# Patient Record
Sex: Female | Born: 1955 | Race: White | Hispanic: No | Marital: Married | Smoking: Never smoker
Health system: Southern US, Community
[De-identification: ages and names within clinical notes are randomized; demographics above are authoritative.]

## PROBLEM LIST (undated history)

## (undated) DIAGNOSIS — K589 Irritable bowel syndrome without diarrhea: Secondary | ICD-10-CM

## (undated) DIAGNOSIS — F952 Tourette's disorder: Secondary | ICD-10-CM

## (undated) DIAGNOSIS — T4145XA Adverse effect of unspecified anesthetic, initial encounter: Secondary | ICD-10-CM

## (undated) DIAGNOSIS — T8859XA Other complications of anesthesia, initial encounter: Secondary | ICD-10-CM

## (undated) DIAGNOSIS — Z9889 Other specified postprocedural states: Secondary | ICD-10-CM

## (undated) DIAGNOSIS — M1990 Unspecified osteoarthritis, unspecified site: Secondary | ICD-10-CM

## (undated) DIAGNOSIS — R112 Nausea with vomiting, unspecified: Secondary | ICD-10-CM

## (undated) DIAGNOSIS — N814 Uterovaginal prolapse, unspecified: Secondary | ICD-10-CM

## (undated) DIAGNOSIS — J3089 Other allergic rhinitis: Secondary | ICD-10-CM

## (undated) DIAGNOSIS — R519 Headache, unspecified: Secondary | ICD-10-CM

## (undated) DIAGNOSIS — R51 Headache: Secondary | ICD-10-CM

## (undated) DIAGNOSIS — N816 Rectocele: Secondary | ICD-10-CM

## (undated) DIAGNOSIS — Z923 Personal history of irradiation: Secondary | ICD-10-CM

## (undated) DIAGNOSIS — M25551 Pain in right hip: Secondary | ICD-10-CM

## (undated) DIAGNOSIS — R87619 Unspecified abnormal cytological findings in specimens from cervix uteri: Secondary | ICD-10-CM

## (undated) HISTORY — DX: Headache, unspecified: R51.9

## (undated) HISTORY — DX: Unspecified abnormal cytological findings in specimens from cervix uteri: R87.619

## (undated) HISTORY — DX: Headache: R51

## (undated) HISTORY — DX: Rectocele: N81.6

## (undated) HISTORY — PX: SINOSCOPY: SHX187

## (undated) HISTORY — PX: EYE SURGERY: SHX253

## (undated) HISTORY — PX: BUNIONECTOMY: SHX129

## (undated) HISTORY — DX: Uterovaginal prolapse, unspecified: N81.4

## (undated) HISTORY — DX: Irritable bowel syndrome, unspecified: K58.9

## (undated) HISTORY — PX: TONSILLECTOMY: SUR1361

## (undated) HISTORY — PX: FOOT SURGERY: SHX648

## (undated) HISTORY — DX: Tourette's disorder: F95.2

## (undated) HISTORY — DX: Pain in right hip: M25.551

## (undated) HISTORY — DX: Unspecified osteoarthritis, unspecified site: M19.90

---
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
## 1999-05-14 HISTORY — PX: JOINT REPLACEMENT: SHX530

## 1999-06-21 ENCOUNTER — Encounter: Payer: Self-pay | Admitting: Rheumatology

## 1999-06-21 ENCOUNTER — Encounter: Admission: RE | Admit: 1999-06-21 | Discharge: 1999-06-21 | Payer: Self-pay | Admitting: Family Medicine

## 1999-07-18 ENCOUNTER — Other Ambulatory Visit: Admission: RE | Admit: 1999-07-18 | Discharge: 1999-07-18 | Payer: Self-pay | Admitting: Obstetrics and Gynecology

## 2000-04-11 ENCOUNTER — Encounter: Admission: RE | Admit: 2000-04-11 | Discharge: 2000-04-11 | Payer: Self-pay | Admitting: Family Medicine

## 2000-04-11 ENCOUNTER — Encounter: Payer: Self-pay | Admitting: Family Medicine

## 2000-10-08 ENCOUNTER — Other Ambulatory Visit: Admission: RE | Admit: 2000-10-08 | Discharge: 2000-10-08 | Payer: Self-pay | Admitting: Obstetrics and Gynecology

## 2001-01-23 ENCOUNTER — Encounter: Payer: Self-pay | Admitting: Rheumatology

## 2001-01-23 ENCOUNTER — Encounter: Admission: RE | Admit: 2001-01-23 | Discharge: 2001-01-23 | Payer: Self-pay | Admitting: Rheumatology

## 2001-10-20 ENCOUNTER — Encounter: Payer: Self-pay | Admitting: Family Medicine

## 2001-10-20 ENCOUNTER — Encounter: Admission: RE | Admit: 2001-10-20 | Discharge: 2001-10-20 | Payer: Self-pay | Admitting: Family Medicine

## 2002-09-17 ENCOUNTER — Ambulatory Visit (HOSPITAL_BASED_OUTPATIENT_CLINIC_OR_DEPARTMENT_OTHER): Admission: RE | Admit: 2002-09-17 | Discharge: 2002-09-17 | Payer: Self-pay | Admitting: Urology

## 2002-09-17 ENCOUNTER — Encounter (INDEPENDENT_AMBULATORY_CARE_PROVIDER_SITE_OTHER): Payer: Self-pay | Admitting: *Deleted

## 2003-02-18 ENCOUNTER — Other Ambulatory Visit: Admission: RE | Admit: 2003-02-18 | Discharge: 2003-02-18 | Payer: Self-pay | Admitting: Obstetrics and Gynecology

## 2004-10-22 ENCOUNTER — Encounter: Admission: RE | Admit: 2004-10-22 | Discharge: 2004-10-22 | Payer: Self-pay | Admitting: Family Medicine

## 2004-10-29 ENCOUNTER — Other Ambulatory Visit: Admission: RE | Admit: 2004-10-29 | Discharge: 2004-10-29 | Payer: Self-pay | Admitting: Obstetrics and Gynecology

## 2004-12-18 ENCOUNTER — Ambulatory Visit (HOSPITAL_COMMUNITY): Admission: RE | Admit: 2004-12-18 | Discharge: 2004-12-18 | Payer: Self-pay | Admitting: Gastroenterology

## 2005-11-19 ENCOUNTER — Other Ambulatory Visit: Admission: RE | Admit: 2005-11-19 | Discharge: 2005-11-19 | Payer: Self-pay | Admitting: Obstetrics and Gynecology

## 2005-11-28 ENCOUNTER — Encounter: Admission: RE | Admit: 2005-11-28 | Discharge: 2005-11-28 | Payer: Self-pay | Admitting: Obstetrics and Gynecology

## 2006-12-02 ENCOUNTER — Other Ambulatory Visit: Admission: RE | Admit: 2006-12-02 | Discharge: 2006-12-02 | Payer: Self-pay | Admitting: Obstetrics & Gynecology

## 2007-11-16 ENCOUNTER — Other Ambulatory Visit: Admission: RE | Admit: 2007-11-16 | Discharge: 2007-11-16 | Payer: Self-pay | Admitting: Obstetrics and Gynecology

## 2009-12-11 DIAGNOSIS — R87619 Unspecified abnormal cytological findings in specimens from cervix uteri: Secondary | ICD-10-CM

## 2009-12-11 HISTORY — PX: COLPOSCOPY: SHX161

## 2009-12-11 HISTORY — DX: Unspecified abnormal cytological findings in specimens from cervix uteri: R87.619

## 2010-09-28 NOTE — Op Note (Signed)
   NAME:  Lauren Osborne, Lauren Osborne                       ACCOUNT NO.:  [id number]   MEDICAL RECORD NO.:  [id number]                   PATIENT TYPE:  AMB   LOCATION:  [facility]                                 FACILITY:  [facility]   PHYSICIAN:  Ronald L. Ovidio Hanger, M.D.           DATE OF BIRTH:  Feb 15, 1956   DATE OF PROCEDURE:  09/17/2002  DATE OF DISCHARGE:                                 OPERATIVE REPORT   DIAGNOSIS:  Voiding symptoms suspicious of interstitial cystitis.   OPERATIVE PROCEDURE:  1. Cystourethroscopy.  2. Hydraulic bladder distention.  3. Bladder biopsy.   SURGEON:  Lucrezia Starch. Earlene Plater, M.D.   ANESTHESIA:  General laryngeal airway.   ESTIMATED BLOOD LOSS:  Negligible.   TUBES:  None.   COMPLICATIONS:  None.   INDICATIONS FOR PROCEDURE:  [name] is a very lovely 55 year old white  female, who presents with bladder pain, urgency, frequency, suprapubic pain  relieved by voiding and hesitancy.  She notes it has been variable, but she  has also had some constriction flow and cramping in the lower pelvis.  She  has also associated spastic colon, fibromyalgia, and other symptoms and  after understanding risks, benefits, and alternatives she elected to proceed  with cystoscopy, hydraulic bladder distension, and bladder biopsy, both for  therapeutic and diagnostic purposes.   PROCEDURE IN DETAIL:  The patient was placed in a supine position.  After  proper general laryngeal airway anesthesia, was placed in the dorsal  lithotomy position and prepped and draped with Betadine in a sterile  fashion.  Cystourethroscopy was performed with the 22.5 French Olympus  panendoscope, utilizing the 12 and 70 degree lenses.  The bladder was  carefully inspected and noted to be without lesions.  There was some mild  urethritis, and efflux of clear urine was noted from the normally-placed  ureteral orifices bilaterally.  The bladder was gradually filled to a  pressure of 80 cm of water pressure  with normal saline, and capacity at that  point was noted to be 750 mL.  Inspection revealed some submucosal  glomerulations diffusely but especially of the posterior wall.  There were  no actual cracks or bleeding areas and no Hunner's ulcers.  The posterior  midline was then biopsied with cold cup biopsy forceps and submitted to  pathology, and the base was cauterized with Bugbee coagulation cautery.  No  other lesions were noted.  The bladder was drained.  The panendoscope was  removed, and the patient was taken to the recovery room stable.                                               Ronald L. Ovidio Hanger, M.D.    RLD/MEDQ  D:  09/17/2002  T:  09/17/2002  Job:  [id number]

## 2011-08-15 ENCOUNTER — Other Ambulatory Visit: Payer: Self-pay | Admitting: Family Medicine

## 2012-09-17 DIAGNOSIS — N814 Uterovaginal prolapse, unspecified: Secondary | ICD-10-CM | POA: Insufficient documentation

## 2012-10-21 HISTORY — PX: UTERINE SUSPENSION: SUR1430

## 2012-10-21 HISTORY — PX: VAGINAL HYSTERECTOMY: SUR661

## 2012-10-21 HISTORY — PX: OTHER SURGICAL HISTORY: SHX169

## 2012-11-25 ENCOUNTER — Telehealth: Payer: Self-pay

## 2012-11-25 NOTE — Telephone Encounter (Signed)
[facility] medical records patient is calling to see if we can give her the last date of service in her paper chart and who she saw if possible?? Please call her at [phone] she has an interview with her insurance company today at 200.

## 2012-11-26 NOTE — Telephone Encounter (Signed)
Spoke with patient. States she already received the information she needed yesterday. Says that her insurance company will be sending a request for medical records and would like that request done as quickly as possible. Informed her that either Maudia or Dala Dock will process that request for her when it has been received.

## 2013-02-18 ENCOUNTER — Other Ambulatory Visit: Payer: Self-pay

## 2013-02-18 DIAGNOSIS — Z1231 Encounter for screening mammogram for malignant neoplasm of breast: Secondary | ICD-10-CM

## 2013-03-15 ENCOUNTER — Ambulatory Visit
Admission: RE | Admit: 2013-03-15 | Discharge: 2013-03-15 | Disposition: A | Discharge status: Home / Self Care | Payer: BC Managed Care – PPO | Source: Ambulatory Visit

## 2013-03-15 DIAGNOSIS — Z1231 Encounter for screening mammogram for malignant neoplasm of breast: Secondary | ICD-10-CM

## 2013-04-06 ENCOUNTER — Ambulatory Visit (INDEPENDENT_AMBULATORY_CARE_PROVIDER_SITE_OTHER): Payer: BC Managed Care – PPO | Admitting: Family Medicine

## 2013-04-06 VITALS — BP 118/78 | HR 74 | Temp 97.8°F | Resp 18 | Ht 70.0 in | Wt 194.0 lb

## 2013-04-06 DIAGNOSIS — IMO0001 Reserved for inherently not codable concepts without codable children: Secondary | ICD-10-CM

## 2013-04-06 DIAGNOSIS — N390 Urinary tract infection, site not specified: Secondary | ICD-10-CM

## 2013-04-06 DIAGNOSIS — R195 Other fecal abnormalities: Secondary | ICD-10-CM

## 2013-04-06 DIAGNOSIS — G4700 Insomnia, unspecified: Secondary | ICD-10-CM

## 2013-04-06 DIAGNOSIS — Z8619 Personal history of other infectious and parasitic diseases: Secondary | ICD-10-CM

## 2013-04-06 DIAGNOSIS — R350 Frequency of micturition: Secondary | ICD-10-CM

## 2013-04-06 DIAGNOSIS — Z8742 Personal history of other diseases of the female genital tract: Secondary | ICD-10-CM

## 2013-04-06 DIAGNOSIS — K5900 Constipation, unspecified: Secondary | ICD-10-CM

## 2013-04-06 DIAGNOSIS — M791 Myalgia, unspecified site: Secondary | ICD-10-CM

## 2013-04-06 DIAGNOSIS — M25569 Pain in unspecified knee: Secondary | ICD-10-CM

## 2013-04-06 DIAGNOSIS — G2581 Restless legs syndrome: Secondary | ICD-10-CM

## 2013-04-06 LAB — POCT URINALYSIS DIPSTICK
Bilirubin, UA: NEGATIVE
Blood, UA: NEGATIVE
Glucose, UA: NEGATIVE
Ketones, UA: NEGATIVE
Nitrite, UA: NEGATIVE
Protein, UA: NEGATIVE
Spec Grav, UA: 1.015
Urobilinogen, UA: 0.2
pH, UA: 7.5

## 2013-04-06 LAB — POCT CBC
Granulocyte percent: 67.8 %G (ref 37–80)
HCT, POC: 45.9 % (ref 37.7–47.9)
Hemoglobin: 14.8 g/dL (ref 12.2–16.2)
Lymph, poc: 1.4 (ref 0.6–3.4)
MCH, POC: 29.7 pg (ref 27–31.2)
MCHC: 32.2 g/dL (ref 31.8–35.4)
MCV: 91.9 fL (ref 80–97)
MID (cbc): 0.4 (ref 0–0.9)
MPV: 9.3 fL (ref 0–99.8)
POC Granulocyte: 3.9 (ref 2–6.9)
POC LYMPH PERCENT: 25.3 %L (ref 10–50)
POC MID %: 6.9 %M (ref 0–12)
Platelet Count, POC: 305 10*3/uL (ref 142–424)
RBC: 4.99 M/uL (ref 4.04–5.48)
RDW, POC: 14 %
WBC: 5.7 10*3/uL (ref 4.6–10.2)

## 2013-04-06 LAB — POCT UA - MICROSCOPIC ONLY
Bacteria, U Microscopic: NEGATIVE
Casts, Ur, LPF, POC: NEGATIVE
Crystals, Ur, HPF, POC: NEGATIVE
Mucus, UA: NEGATIVE
Renal tubular cells: POSITIVE
Yeast, UA: NEGATIVE

## 2013-04-06 LAB — CK: Total CK: 76 U/L (ref 7–177)

## 2013-04-06 MED ORDER — FLUCONAZOLE 150 MG PO TABS: 150.0000 mg | ORAL_TABLET | Freq: Once | ORAL | Status: DC

## 2013-04-06 MED ORDER — NITROFURANTOIN MONOHYD MACRO 100 MG PO CAPS: 100.0000 mg | ORAL_CAPSULE | Freq: Two times a day (BID) | ORAL | Status: DC

## 2013-04-06 MED ORDER — ZOLPIDEM TARTRATE 10 MG PO TABS: 5.0000 mg | ORAL_TABLET | Freq: Every evening | ORAL | Status: DC | PRN

## 2013-04-06 MED ORDER — PRAMIPEXOLE DIHYDROCHLORIDE 0.125 MG PO TABS: 0.1250 mg | ORAL_TABLET | Freq: Every evening | ORAL | Status: DC

## 2013-04-06 MED ORDER — PHENAZOPYRIDINE HCL 100 MG PO TABS: 100.0000 mg | ORAL_TABLET | Freq: Three times a day (TID) | ORAL | Status: DC | PRN

## 2013-04-06 NOTE — Progress Notes (Addendum)
Subjective:  This chart was scribed for Meredith Staggers, MD by Quintella Reichert, ED scribe.  This patient was seen in room [facility] Room 3 and the patient's care was started at 10:05 AM.   Patient ID: Lauren Osborne, female    DOB: [birth year], 57 y.o.   MRN: [id number]  Chief Complaint  Patient presents with  . Urinary Symptoms  . Bowel Symptoms  . Muscle Aches    HPI  Lauren Osborne is a 57 y.o. female PCP: None per patient.  She has been seen here  by Dr. Cleta Alberts and Dr. Milus Glazier in the past, but no specific primary provider.   Pt presents with a few concerns.  1. She complains of one month of urinary symptoms including urgency, frequency and dysuria.  She states that she is urinating "all the time" and has to get to the bathroom very quickly when she feels the urge.  She also complains of mild discomfort in her lower abdomen when voiding described as a nervous or "butterflies" sensation.  Also complains of occasional mild lower back pain.  No fever, nausea, vomiting.  Attempted to treat pain with a leftover Uribel, without relief.  2. She also complains of alternating hard and soft stools.  Pt had a hysterectomy and rectocele repair in June 2014 at [facility].  She states that her bowel movements have never returned to normal since then.  She states "my stool is either pasty or hard with no rhyme or reason"  She was on Miralax on-and-off after the surgery but has stopped taking it regularly.  She has taken it only one time in the past several weeks.  She states her stool is more often hard than pasty but "seems to switch."  No fevers, blood in stool, unexplained weight loss, or night sweats.  She does not take probiotics regularly.  She was taking fiber supplements after the surgery but now is attempting to get fiber through her diet.  She states that the supplement was causing her stools to consistently be "just a paste."  3. She also complains of an occasional "crawling" sensation in  the back of her legs that is occasionally present during the day but more pronounced at night.  This has been ongoing for several years and causes some difficulty sleeping.  She states it does not hurt at all but is uncomfortable.  She denies a similar sensation in any other area of her body.  She states she has been diagnosed with restless leg syndrome and was on Lyrica one time but had adverse side effects described as "like having a stroke."  She states her Lyrica was prescribed for "fibromyalgia-like symptoms" but she has never taken medications specifically for RLS to her knowledge.  She has never been diagnosed conclusively with fibromyalgia to her knowledge.  She has taken Ambien before "very successfully."  She states "I have to be careful because I have a lot of reactions to different pills" including "some of the harder medications."  However she only specifically recalls adverse reactions to Lyrica and Sulfa antibiotics.  She does not recall having a muscle enzyme test recently.  Pt states she has some anxiety when talking about her health to new providers because she has multiple chronic medical conditions of unknown cause and feels uncomfortable "going into the whole litany."  She works as a Airline pilot and teaches business.  There are no active problems to display for this patient.   History reviewed. No pertinent past medical history.  Past Surgical History  Procedure Laterality Date  . Joint replacement    . Abdominal hysterectomy      Rectocele    Allergies  Allergen Reactions  . Sulfa Antibiotics Other (See Comments)    Stomach cramps and diarrhea    Prior to Admission medications   Medication Sig Start Date End Date Taking? Authorizing Provider  Multiple Vitamin (MULTIVITAMIN) tablet Take 1 tablet by mouth daily.   Yes Historical Provider, MD    History   Social History  . Marital Status: Married    Spouse Name: N/A    Number of Children: N/A  . Years of  Education: N/A   Occupational History  . Not on file.   Social History Main Topics  . Smoking status: Never Smoker   . Smokeless tobacco: Not on file  . Alcohol Use: 2.4 oz/week    4 Glasses of wine per week  . Drug Use: No  . Sexual Activity: Not on file   Other Topics Concern  . Not on file   Social History Narrative  . No narrative on file     Review of Systems  Constitutional: Negative for fever, diaphoresis, fatigue and unexpected weight change.  Respiratory: Negative for chest tightness and shortness of breath.   Cardiovascular: Negative for chest pain, palpitations and leg swelling.  Gastrointestinal: Negative for nausea, vomiting and blood in stool.       Alternating hard and soft stools  Genitourinary: Positive for dysuria (mild lower abdominal discomfort when voiding), urgency and frequency.  Musculoskeletal: Positive for back pain.       "crawling" sensation in back of legs  Neurological: Negative for dizziness, syncope, light-headedness and headaches.        Objective:   Physical Exam  Nursing note and vitals reviewed. Constitutional: She is oriented to person, place, and time. She appears well-developed and well-nourished. No distress.  HENT:  Head: Normocephalic and atraumatic.  Eyes: Conjunctivae and EOM are normal. Pupils are equal, round, and reactive to light.  Neck: Neck supple. Carotid bruit is not present. No tracheal deviation present.  Cardiovascular: Normal rate, regular rhythm, normal heart sounds and intact distal pulses.   No murmur heard. Pulmonary/Chest: Effort normal and breath sounds normal. No respiratory distress. She has no wheezes. She has no rales.  Abdominal: Soft. She exhibits no pulsatile midline mass. There is tenderness (minimal) in the suprapubic area. There is no rebound, no guarding and no CVA tenderness.  Negative heel jarring  Musculoskeletal: Normal range of motion. She exhibits tenderness.  Diffuse tenderness to lower  extremities bilaterally  Neurological: She is alert and oriented to person, place, and time.  Skin: Skin is warm and dry.  Psychiatric: She has a normal mood and affect. Her behavior is normal.   Filed Vitals:   04/06/13 0917  BP: 118/78  Pulse: 74  Temp: 97.8 F (36.6 C)  TempSrc: Oral  Resp: 18  Height: 5\' 10"  (1.778 m)  Weight: 194 lb (87.998 kg)  SpO2: 98%   Results for orders placed in visit on 04/06/13  POCT UA - MICROSCOPIC ONLY      Result Value Range   WBC, Ur, HPF, POC 6-18     RBC, urine, microscopic 0-2     Bacteria, U Microscopic neg     Mucus, UA neg     Epithelial cells, urine per micros 3-18     Crystals, Ur, HPF, POC neg     Casts, Ur, LPF, POC neg  Yeast, UA neg     Renal tubular cells pos    POCT URINALYSIS DIPSTICK      Result Value Range   Color, UA yellow     Clarity, UA clear     Glucose, UA neg     Bilirubin, UA neg     Ketones, UA neg     Spec Grav, UA 1.015     Blood, UA neg     pH, UA 7.5     Protein, UA neg     Urobilinogen, UA 0.2     Nitrite, UA neg     Leukocytes, UA small (1+)    POCT CBC      Result Value Range   WBC 5.7  4.6 - 10.2 K/uL   Lymph, poc 1.4  0.6 - 3.4   POC LYMPH PERCENT 25.3  10 - 50 %L   MID (cbc) 0.4  0 - 0.9   POC MID % 6.9  0 - 12 %M   POC Granulocyte 3.9  2 - 6.9   Granulocyte percent 67.8  37 - 80 %G   RBC 4.99  4.04 - 5.48 M/uL   Hemoglobin 14.8  12.2 - 16.2 g/dL   HCT, POC 04.5  40.9 - 47.9 %   MCV 91.9  80 - 97 fL   MCH, POC 29.7  27 - 31.2 pg   MCHC 32.2  31.8 - 35.4 g/dL   RDW, POC 81.1     Platelet Count, POC 305  142 - 424 K/uL   MPV 9.3  0 - 99.8 fL       Assessment & Plan:  DARSHA ZUMSTEIN is a 57 y.o. female  Insomnia - Plan: zolpidem (AMBIEN) 10 MG tablet  Urinary frequency - Plan: POCT UA - Microscopic Only, POCT urinalysis dipstick, POCT CBC, phenazopyridine (PYRIDIUM) 100 MG tablet, Urine culture  Myalgia - Plan: CK, POCT CBC, pramipexole (MIRAPEX) 0.125 MG tablet  Pain in  joint, lower leg, unspecified laterality - Plan: CK, pramipexole (MIRAPEX) 0.125 MG tablet  Unspecified constipation  Loose stools - Plan: POCT CBC  Restless legs - Plan: pramipexole (MIRAPEX) 0.125 MG tablet  UTI (urinary tract infection) - Plan: nitrofurantoin, macrocrystal-monohydrate, (MACROBID) 100 MG capsule, phenazopyridine (PYRIDIUM) 100 MG tablet, Urine culture  History of candidal vulvovaginitis - Plan: fluconazole (DIFLUCAN) 150 MG tablet  Insomnia with feeling of crawling sensation in legs.  Longstanding symptoms.  Apparently has had possible diagnosis of fibromyalgia but symptoms may be restless leg syndrome and possible underlying anxiety.  Discussed.  Declined starting new medication as she is planning on going out of town.  So will continue Ambien temporarily for her insomnia until she returns and can discuss this further.  But can also try Mirapex instead of Ambien once she returns from out of town.  Side effects discussed.  Return to clinic precautions discussed.  UTI.  Check culture.  Start Macrobid and Pyridium as above.  Fluids and symptomatic care as below, with return to clinic precautions.  Diflucan prescribed as needed with h/o yeast infection on antibiotics.  Change in stool, with alternating loose stools and constipation.  H/o rectocele repair, but symptoms sound typical of IBS.  Discussed trial of Align and fiber supplement each day, as well as discussed GI referral for evaluation of these symptoms, but this was declined at present time.  Can discuss at follow-up as well, but if calls I will be happy to refer to GI in the interim.  I personally  performed the services described in this documentation, which was scribed in my presence. The recorded information has been reviewed and considered, and addended by me as needed.   Meds ordered this encounter  Medications  . Multiple Vitamin (MULTIVITAMIN) tablet    Sig: Take 1 tablet by mouth daily.  [name] zolpidem (AMBIEN) 10  MG tablet    Sig: Take 0.5 tablets (5 mg total) by mouth at bedtime as needed for sleep.    Dispense:  15 tablet    Refill:  1  . pramipexole (MIRAPEX) 0.125 MG tablet    Sig: Take 1 tablet (0.125 mg total) by mouth every evening. 2-3 hours before bedtime    Dispense:  30 tablet    Refill:  1  . nitrofurantoin, macrocrystal-monohydrate, (MACROBID) 100 MG capsule    Sig: Take 1 capsule (100 mg total) by mouth 2 (two) times daily.    Dispense:  14 capsule    Refill:  0  . phenazopyridine (PYRIDIUM) 100 MG tablet    Sig: Take 1 tablet (100 mg total) by mouth 3 (three) times daily as needed for pain.    Dispense:  10 tablet    Refill:  0  . fluconazole (DIFLUCAN) 150 MG tablet    Sig: Take 1 tablet (150 mg total) by mouth once. Can repeat in 1 week if needed.    Dispense:  1 tablet    Refill:  1     Patient Instructions  Your leg symptoms may be due to "restless leg syndrome", and a medicine like Mirapex may be more helpful than Ambien, but if you want to just try Ambien until you return form out of the country (as you have tolerated this in the past), then when returned - can try Mirapex instead of Ambien. I will check a muscle enzyme test, but will need to follow up to discuss this further.  Since you have seen Dr. Cleta Alberts and Lauenstein in the past - you can discuss these symptoms with one of those providers if you feel more comfortable, but do recommend you recheck to discuss these symptoms. To look up more info on your condition, go to the website [email or web address], then on patient resources - select UPTODATE. Under patient resources, select Insomnia, or restless leg syndrome.   Your urine test does indicate an infection.  Start Macrobid, and we will check urine culture to make sure this is the right antibiotic. If signs or symptoms of yeast infection - can start diflucan as discussed.   Fiber supplement each day and can try Align probiotic over the counter for your change in stools. Recommend  discussing this with a gastroenterologist to determine if further imaging or workup needed. Return to the clinic or go to the nearest emergency room if any of your symptoms worsen or new symptoms occur.   Return to the clinic or go to the nearest emergency room if any of your symptoms worsen or new symptoms occur.  Urinary Tract Infection Urinary tract infections (UTIs) can develop anywhere along your urinary tract. Your urinary tract is your body's drainage system for removing wastes and extra water. Your urinary tract includes two kidneys, two ureters, a bladder, and a urethra. Your kidneys are a pair of bean-shaped organs. Each kidney is about the size of your fist. They are located below your ribs, one on each side of your spine. CAUSES Infections are caused by microbes, which are microscopic organisms, including fungi, viruses, and bacteria. These organisms are so small that  they can only be seen through a microscope. Bacteria are the microbes that most commonly cause UTIs. SYMPTOMS  Symptoms of UTIs may vary by age and gender of the patient and by the location of the infection. Symptoms in young women typically include a frequent and intense urge to urinate and a painful, burning feeling in the bladder or urethra during urination. Older women and men are more likely to be tired, shaky, and weak and have muscle aches and abdominal pain. A fever may mean the infection is in your kidneys. Other symptoms of a kidney infection include pain in your back or sides below the ribs, nausea, and vomiting. DIAGNOSIS To diagnose a UTI, your caregiver will ask you about your symptoms. Your caregiver also will ask to provide a urine sample. The urine sample will be tested for bacteria and white blood cells. White blood cells are made by your body to help fight infection. TREATMENT  Typically, UTIs can be treated with medication. Because most UTIs are caused by a bacterial infection, they usually can be treated  with the use of antibiotics. The choice of antibiotic and length of treatment depend on your symptoms and the type of bacteria causing your infection. HOME CARE INSTRUCTIONS  If you were prescribed antibiotics, take them exactly as your caregiver instructs you. Finish the medication even if you feel better after you have only taken some of the medication.  Drink enough water and fluids to keep your urine clear or pale yellow.  Avoid caffeine, tea, and carbonated beverages. They tend to irritate your bladder.  Empty your bladder often. Avoid holding urine for long periods of time.  Empty your bladder before and after sexual intercourse.  After a bowel movement, women should cleanse from front to back. Use each tissue only once. SEEK MEDICAL CARE IF:   You have back pain.  You develop a fever.  Your symptoms do not begin to resolve within 3 days. SEEK IMMEDIATE MEDICAL CARE IF:   You have severe back pain or lower abdominal pain.  You develop chills.  You have nausea or vomiting.  You have continued burning or discomfort with urination. MAKE SURE YOU:   Understand these instructions.  Will watch your condition.  Will get help right away if you are not doing well or get worse. Document Released: 02/06/2005 Document Revised: 10/29/2011 Document Reviewed: 06/07/2011 [facility] Patient Information 2014 [place], [place].    I personally performed the services described in this documentation, which was scribed in my presence. The recorded information has been reviewed and considered, and addended by me as needed.

## 2013-04-06 NOTE — Patient Instructions (Addendum)
Your leg symptoms may be due to "restless leg syndrome", and a medicine like Mirapex may be more helpful than Ambien, but if you want to just try Ambien until you return form out of the country (as you have tolerated this in the past), then when returned - can try Mirapex instead of Ambien. I will check a muscle enzyme test, but will need to follow up to discuss this further.  Since you have seen Dr. Cleta Alberts and Lauenstein in the past - you can discuss these symptoms with one of those providers if you feel more comfortable, but do recommend you recheck to discuss these symptoms. To look up more info on your condition, go to the website [email or web address], then on patient resources - select UPTODATE. Under patient resources, select Insomnia, or restless leg syndrome.   Your urine test does indicate an infection.  Start Macrobid, and we will check urine culture to make sure this is the right antibiotic. If signs or symptoms of yeast infection - can start diflucan as discussed.   Fiber supplement each day and can try Align probiotic over the counter for your change in stools. Recommend discussing this with a gastroenterologist to determine if further imaging or workup needed. Return to the clinic or go to the nearest emergency room if any of your symptoms worsen or new symptoms occur.   Return to the clinic or go to the nearest emergency room if any of your symptoms worsen or new symptoms occur.  Urinary Tract Infection Urinary tract infections (UTIs) can develop anywhere along your urinary tract. Your urinary tract is your body's drainage system for removing wastes and extra water. Your urinary tract includes two kidneys, two ureters, a bladder, and a urethra. Your kidneys are a pair of bean-shaped organs. Each kidney is about the size of your fist. They are located below your ribs, one on each side of your spine. CAUSES Infections are caused by microbes, which are microscopic organisms, including fungi, viruses,  and bacteria. These organisms are so small that they can only be seen through a microscope. Bacteria are the microbes that most commonly cause UTIs. SYMPTOMS  Symptoms of UTIs may vary by age and gender of the patient and by the location of the infection. Symptoms in young women typically include a frequent and intense urge to urinate and a painful, burning feeling in the bladder or urethra during urination. Older women and men are more likely to be tired, shaky, and weak and have muscle aches and abdominal pain. A fever may mean the infection is in your kidneys. Other symptoms of a kidney infection include pain in your back or sides below the ribs, nausea, and vomiting. DIAGNOSIS To diagnose a UTI, your caregiver will ask you about your symptoms. Your caregiver also will ask to provide a urine sample. The urine sample will be tested for bacteria and white blood cells. White blood cells are made by your body to help fight infection. TREATMENT  Typically, UTIs can be treated with medication. Because most UTIs are caused by a bacterial infection, they usually can be treated with the use of antibiotics. The choice of antibiotic and length of treatment depend on your symptoms and the type of bacteria causing your infection. HOME CARE INSTRUCTIONS  If you were prescribed antibiotics, take them exactly as your caregiver instructs you. Finish the medication even if you feel better after you have only taken some of the medication.  Drink enough water and fluids to keep your urine clear  or pale yellow.  Avoid caffeine, tea, and carbonated beverages. They tend to irritate your bladder.  Empty your bladder often. Avoid holding urine for long periods of time.  Empty your bladder before and after sexual intercourse.  After a bowel movement, women should cleanse from front to back. Use each tissue only once. SEEK MEDICAL CARE IF:   You have back pain.  You develop a fever.  Your symptoms do not begin to  resolve within 3 days. SEEK IMMEDIATE MEDICAL CARE IF:   You have severe back pain or lower abdominal pain.  You develop chills.  You have nausea or vomiting.  You have continued burning or discomfort with urination. MAKE SURE YOU:   Understand these instructions.  Will watch your condition.  Will get help right away if you are not doing well or get worse. Document Released: 02/06/2005 Document Revised: 10/29/2011 Document Reviewed: 06/07/2011 [facility] Patient Information 2014 [place], [place].

## 2013-04-07 LAB — URINE CULTURE
Colony Count: NO GROWTH
Organism ID, Bacteria: NO GROWTH

## 2013-04-20 ENCOUNTER — Telehealth: Payer: Self-pay

## 2013-04-20 NOTE — Telephone Encounter (Signed)
After 3 attempts to call pt and let her know about her labs, I sent her a letter. Reviewed the labs accidentally without writing this message.

## 2013-05-10 ENCOUNTER — Encounter: Payer: Self-pay | Admitting: Family Medicine

## 2013-05-13 DIAGNOSIS — F952 Tourette's disorder: Secondary | ICD-10-CM

## 2013-05-13 DIAGNOSIS — M25551 Pain in right hip: Secondary | ICD-10-CM

## 2013-05-13 HISTORY — DX: Tourette's disorder: F95.2

## 2013-05-13 HISTORY — DX: Pain in right hip: M25.551

## 2014-03-06 DIAGNOSIS — F952 Tourette's disorder: Secondary | ICD-10-CM | POA: Insufficient documentation

## 2014-03-10 DIAGNOSIS — R768 Other specified abnormal immunological findings in serum: Secondary | ICD-10-CM | POA: Insufficient documentation

## 2014-03-22 DIAGNOSIS — M533 Sacrococcygeal disorders, not elsewhere classified: Secondary | ICD-10-CM | POA: Insufficient documentation

## 2014-03-22 DIAGNOSIS — M25559 Pain in unspecified hip: Secondary | ICD-10-CM | POA: Insufficient documentation

## 2014-04-12 HISTORY — PX: OTHER SURGICAL HISTORY: SHX169

## 2015-02-14 ENCOUNTER — Encounter: Payer: Self-pay | Admitting: Emergency Medicine

## 2015-06-20 ENCOUNTER — Telehealth: Payer: Self-pay | Admitting: Obstetrics & Gynecology

## 2015-06-20 NOTE — Telephone Encounter (Signed)
Called patient and left a message to call back and ask for [name].

## 2015-07-06 ENCOUNTER — Encounter: Payer: Self-pay | Admitting: Obstetrics & Gynecology

## 2015-07-06 ENCOUNTER — Ambulatory Visit (INDEPENDENT_AMBULATORY_CARE_PROVIDER_SITE_OTHER): Payer: BC Managed Care – PPO | Admitting: Obstetrics & Gynecology

## 2015-07-06 VITALS — BP 100/66 | HR 92 | Resp 14 | Ht 70.25 in | Wt 200.0 lb

## 2015-07-06 DIAGNOSIS — Z0000 Encounter for general adult medical examination without abnormal findings: Secondary | ICD-10-CM

## 2015-07-06 DIAGNOSIS — R198 Other specified symptoms and signs involving the digestive system and abdomen: Secondary | ICD-10-CM | POA: Diagnosis not present

## 2015-07-06 DIAGNOSIS — M792 Neuralgia and neuritis, unspecified: Secondary | ICD-10-CM

## 2015-07-06 DIAGNOSIS — G588 Other specified mononeuropathies: Secondary | ICD-10-CM

## 2015-07-06 DIAGNOSIS — Z01419 Encounter for gynecological examination (general) (routine) without abnormal findings: Secondary | ICD-10-CM

## 2015-07-06 DIAGNOSIS — R350 Frequency of micturition: Secondary | ICD-10-CM | POA: Diagnosis not present

## 2015-07-06 LAB — CBC
HCT: 43.3 % (ref 36.0–46.0)
Hemoglobin: 14.4 g/dL (ref 12.0–15.0)
MCH: 29.3 pg (ref 26.0–34.0)
MCHC: 33.3 g/dL (ref 30.0–36.0)
MCV: 88.2 fL (ref 78.0–100.0)
MPV: 10.1 fL (ref 8.6–12.4)
Platelets: 327 10*3/uL (ref 150–400)
RBC: 4.91 MIL/uL (ref 3.87–5.11)
RDW: 14 % (ref 11.5–15.5)
WBC: 6.8 10*3/uL (ref 4.0–10.5)

## 2015-07-06 LAB — COMPREHENSIVE METABOLIC PANEL
ALT: 15 U/L (ref 6–29)
AST: 15 U/L (ref 10–35)
Albumin: 4.4 g/dL (ref 3.6–5.1)
Alkaline Phosphatase: 72 U/L (ref 33–130)
BUN: 17 mg/dL (ref 7–25)
CO2: 30 mmol/L (ref 20–31)
Calcium: 9.6 mg/dL (ref 8.6–10.4)
Chloride: 103 mmol/L (ref 98–110)
Creat: 0.85 mg/dL (ref 0.50–1.05)
Glucose, Bld: 90 mg/dL (ref 65–99)
Potassium: 3.9 mmol/L (ref 3.5–5.3)
Sodium: 139 mmol/L (ref 135–146)
Total Bilirubin: 0.5 mg/dL (ref 0.2–1.2)
Total Protein: 6.9 g/dL (ref 6.1–8.1)

## 2015-07-06 LAB — LIPID PANEL
Cholesterol: 200 mg/dL (ref 125–200)
HDL: 59 mg/dL (ref >=46)
LDL Cholesterol: 123 mg/dL (ref <130)
Total CHOL/HDL Ratio: 3.4 Ratio (ref <=5.0)
Triglycerides: 91 mg/dL (ref <150)
VLDL: 18 mg/dL (ref <30)

## 2015-07-06 LAB — POCT URINALYSIS DIPSTICK
Bilirubin, UA: NEGATIVE
Blood, UA: NEGATIVE
Glucose, UA: NEGATIVE
Ketones, UA: NEGATIVE
Nitrite, UA: NEGATIVE
Protein, UA: NEGATIVE
Urobilinogen, UA: NEGATIVE
pH, UA: 6

## 2015-07-06 LAB — TSH: TSH: 1.31 mIU/L

## 2015-07-06 MED ORDER — LIDOCAINE 5 % EX OINT: TOPICAL_OINTMENT | CUTANEOUS | Status: DC

## 2015-07-06 NOTE — Progress Notes (Signed)
60 y.o. [id number] MarriedCaucasianF here for new patient visit.  Pt was former pt of Dr. Julieta Bellini.  Pt reports having surgery in 2014 at [facility] with Dr. Landry Mellow due to rectocele.  Pt had TVH with bilateral salpingectomy and posterior repair with rectal reinforcement and perineorrhaphy.  Pt reports stools are more thin now.  If she has constipation, she really struggles to get the stool to past.  Pt also feels she has issues with urinary urgency.  This does seem to be more related to the bowel symptoms.  Pt states she feels like she aware of rectal pressure almost all the time.  She also has pressure that feels like it extends from inside but higher.  Has seen a PT in [place] but only twice due to distance from home.  Saw Dr. Carlton Adam, GI, at [facility] as well.  Colonoscopy 2/16 was negative.  Reviewed in Care Everywhere  Pt saw Dr. Ronita Hipps in the past.  She's had two MMGs since 2014.     Patient's last menstrual period was 05/13/2005.          Sexually active: Yes.    The current method of family planning is status post hysterectomy.    Exercising: Yes.    [employer], [employer] Smoker:  no  Health Maintenance: Pap: 2016 Normal per pt History of abnormal Pap:  Yes, 12/2009 ASCUS, cannot exclude HGSIL. Colpo: polyp fragments Neg. Normal paps since then.  MMG: 03/17/13 BIRADS1:neg. Had in 2015 adn 2016 with Dr. Kennith Maes office.  wil lhave pt sign release.   Colonoscopy: 06/2014.  Dr. Carlton Adam at [facility].  Reviewed in Care everywhere BMD:   2004 TDaP:  unsure  Screening Labs: Will do today   reports that she has never smoked. She has never used smokeless tobacco. She reports that she drinks about 2.4 oz of alcohol per week. She reports that she does not use illicit drugs.  Past Medical History  Diagnosis Date  . Rectocele   . Abnormal Pap smear of cervix 12/2009    ASCUS cannot exclude HGSIL.  [name] Cystocele or rectocele with uterine prolapse   . 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 From [place]   . Arthralgia of right hip 2015    Hip replacement     Past Surgical History  Procedure Laterality Date  . Joint replacement Right 2001    Hip replacement   . Vaginal hysterectomy  10/21/12    Procedure: HYSTERECTOMY VAGINAL; Surgeon: Daneil Dolin, MD; Location: [facility]; Service: Gynecology; Laterality: N/A; bilateral salpingectomy  . Uterine suspension  10/21/12    Procedure: UTEROSACRAL SUSPENSION; Surgeon: Daneil Dolin, MD; Location: [facility] MAIN OR; Service: Gynecology;;  . Colposcopy  12/2009    negative  . Posterior vaginal repair  10/21/2012    Procedure: VAGINAL REPAIR POSTERIOR; Surgeon: Daneil Dolin, MD; Location: [facility] MAIN OR; Service: Gynecology;;    Current Outpatient Prescriptions  Medication Sig Dispense Refill  . aspirin-acetaminophen-caffeine (EXCEDRIN MIGRAINE) 250-250-65 MG tablet Take by mouth.    . Cholecalciferol (D 5000) 5000 units TABS Take by mouth.    . diazepam (VALIUM) 2 MG tablet     . polyethylene glycol powder (MIRALAX) powder Take 17 g by mouth.    . solifenacin (VESICARE) 5 MG tablet Take 5 mg by mouth daily.    [name] zolpidem (AMBIEN) 10 MG tablet Take 0.5 tablets (5 mg total) by mouth at bedtime as needed for sleep.  15 tablet 1   No current facility-administered medications for this visit.    Family History  Problem Relation Age of Onset  . Cancer Father     ROS:  Pertinent items are noted in HPI.  Otherwise, a comprehensive ROS was negative.  Exam:   BP 100/66 mmHg  Pulse 92  Resp 14  Ht 5' 10.25" (1.784 m)  Wt 200 lb (90.719 kg)  BMI 28.50 kg/m2  LMP 05/13/2005  Weight change:    Height: 5' 10.25" (178.4 cm)  Ht Readings from Last 3 Encounters:  07/06/15 5' 10.25" (1.784 m)  04/06/13 5\' 10"  (1.778 m)    General appearance: alert, cooperative and appears stated age Head: Normocephalic, without obvious abnormality, atraumatic Neck: no adenopathy, supple,  symmetrical, trachea midline and thyroid normal to inspection and palpation Lungs: clear to auscultation bilaterally Breasts: normal appearance, no masses or tenderness Heart: regular rate and rhythm Abdomen: soft, non-tender; bowel sounds normal; no masses,  no organomegaly Extremities: extremities normal, atraumatic, no cyanosis or edema Skin: Skin color, texture, turgor normal. No rashes or lesions Lymph nodes: Cervical, supraclavicular, and axillary nodes normal. No abnormal inguinal nodes palpated Neurologic: Grossly normal  Pelvic: External genitalia:  no lesions              Urethra:  normal appearing urethra with no masses, tenderness or lesions              Bartholins and Skenes: normal                 Vagina: normal appearing vagina with normal color and discharge, no lesions              Cervix: absent              Pap taken: No. Bimanual Exam:  Uterus:  uterus absent              Adnexa: normal adnexa and no mass, fullness, tenderness               Rectovaginal: Confirms               Anus:  normal sphincter tone, no lesions, tenderness to pt with palpation of anal sphincter at 2-4 o'clock location, no masses  Chaperone was present for exam.  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  Pt aware overdue.  She will call to schedule. pap smear not indicated CBC, CMP, TSH, Vit D, Lipids today.  Pt is not fasting. Cymbalta vs neurontin discussed for possible change in nerve symptoms.  Pt willing to try.  Cymbalta 30mg  daily prescribed.  Rx to pharmacy.  Side effects/risks reviewed.  Pt will call with any issues. Refer to Ileana Roup for PT assessment and treatment.  May need to consider resection of proline suture on left side  if symptoms do not improve Will plan follow-up after pt is seen for PT. Urine micro and culture return annually or prn  ~In excess of an hour spent with pt discussing surgery, reviewing records and pathology and operative notes, as well as discussing options for treatment.  >50% of time was in face to face discussion.

## 2015-07-07 DIAGNOSIS — R350 Frequency of micturition: Secondary | ICD-10-CM | POA: Insufficient documentation

## 2015-07-07 DIAGNOSIS — R198 Other specified symptoms and signs involving the digestive system and abdomen: Secondary | ICD-10-CM | POA: Insufficient documentation

## 2015-07-07 DIAGNOSIS — G588 Other specified mononeuropathies: Secondary | ICD-10-CM | POA: Insufficient documentation

## 2015-07-07 LAB — URINALYSIS, MICROSCOPIC ONLY
Bacteria, UA: NONE SEEN [HPF]
Casts: NONE SEEN [LPF]
Crystals: NONE SEEN [HPF]
Squamous Epithelial / LPF: NONE SEEN [HPF] (ref <=5)
WBC, UA: NONE SEEN WBC/HPF (ref <=5)
Yeast: NONE SEEN [HPF]

## 2015-07-07 LAB — VITAMIN D 25 HYDROXY (VIT D DEFICIENCY, FRACTURES): Vit D, 25-Hydroxy: 30 ng/mL (ref 30–100)

## 2015-07-07 LAB — URINE CULTURE: Colony Count: 30000

## 2015-07-07 MED ORDER — DULOXETINE HCL 30 MG PO CPEP: 30.0000 mg | ORAL_CAPSULE | Freq: Every day | ORAL | Status: DC

## 2015-07-10 ENCOUNTER — Telehealth: Payer: Self-pay | Admitting: *Deleted

## 2015-07-10 NOTE — Telephone Encounter (Signed)
Notes Recorded by Elroy Channel, CMA on 07/10/2015 at 9:16 AM LM for pt to call back. Notes Recorded by [name], MD on 07/09/2015 at 7:50 AM Please inform pt that her urine culture was negative. Urine micro was normal. CMP, lipids, TSh, Vit D and CBC were all normal. I spoke with her last week about starting Cymbalta to see if this would help with her pain. Can you see how she's doing with this. She should have started it on Friday or Saturday. I told her to expect nausea, in particular, but this would go away after a week.

## 2015-07-10 NOTE — Telephone Encounter (Signed)
Patient returning call.

## 2015-07-10 NOTE — Telephone Encounter (Signed)
Notes Recorded by Elroy Channel, CMA on 07/10/2015 at 10:25 AM Pt notified. Verbalized understanding. Patient states she took one pill yesterday. She states she does not want to take them and will try to go to PT before committing to the pills.  Dr. Lestine Box  Encounter closed.

## 2015-07-24 ENCOUNTER — Ambulatory Visit: Payer: Self-pay

## 2015-07-24 ENCOUNTER — Ambulatory Visit (INDEPENDENT_AMBULATORY_CARE_PROVIDER_SITE_OTHER): Payer: BC Managed Care – PPO

## 2015-07-24 ENCOUNTER — Telehealth: Payer: Self-pay | Admitting: Obstetrics & Gynecology

## 2015-07-24 ENCOUNTER — Encounter: Payer: Self-pay | Admitting: Podiatry

## 2015-07-24 ENCOUNTER — Ambulatory Visit (INDEPENDENT_AMBULATORY_CARE_PROVIDER_SITE_OTHER): Payer: BC Managed Care – PPO | Admitting: Podiatry

## 2015-07-24 VITALS — BP 110/75 | HR 74 | Resp 16 | Ht 70.25 in | Wt 195.0 lb

## 2015-07-24 DIAGNOSIS — M25579 Pain in unspecified ankle and joints of unspecified foot: Secondary | ICD-10-CM

## 2015-07-24 DIAGNOSIS — M779 Enthesopathy, unspecified: Secondary | ICD-10-CM

## 2015-07-24 DIAGNOSIS — M79672 Pain in left foot: Secondary | ICD-10-CM | POA: Diagnosis not present

## 2015-07-24 DIAGNOSIS — M216X9 Other acquired deformities of unspecified foot: Secondary | ICD-10-CM | POA: Diagnosis not present

## 2015-07-24 DIAGNOSIS — M79671 Pain in right foot: Secondary | ICD-10-CM

## 2015-07-24 MED ORDER — TRIAMCINOLONE ACETONIDE 10 MG/ML IJ SUSP
10.0000 mg | Freq: Once | INTRAMUSCULAR | Status: AC
  Administered 2015-07-24: 10 mg

## 2015-07-24 NOTE — Progress Notes (Signed)
Subjective:     Patient ID: Lauren Osborne, female   DOB: 06/14/1955, 60 y.o.   MRN: [id number]  HPI patient presents stating that she's had a lot of pain under her big toe joint right foot and she has a lot of pain in her ankles of both feet for the last few months. Patient states she's had trouble for several years and has history of bunion correction 5 years ago in [facility] by Dr. Ouida Sills. States that it makes it hard at times to walk on   Review of Systems  All other systems reviewed and are negative.      Objective:   Physical Exam  Constitutional: She is oriented to person, place, and time.  Cardiovascular: Intact distal pulses.   Musculoskeletal: Normal range of motion.  Neurological: She is oriented to person, place, and time.  Skin: Skin is warm.  Nursing note and vitals reviewed.  neurovascular status found to be intact with muscle strength adequate range of motion within normal limits with patient noted to have plantar flexed first metatarsal right with keratotic lesion fluid buildup and exquisite tenderness in the sinus tarsi of both feet with fluid buildup noted. Patient does have a cavus foot type with a plantarflexed first metatarsal bilateral with inflammatory changes noted. 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 Today I did a careful plantar injection of the capsule 3 mg Dexon some Kenalog 5 mill grams Xylocaine and debrided the lesion. I then reviewed x-rays of her ankles and carefully injected the sinus tarsi bilateral 3 mg Kenalog 5 mg Xylocaine and I am going to consider some type of an orthotic to reduce the stress for this patient. She'll be seen back to reevaluate again in the next several weeks  X-ray report indicates  there is plantar flexion the first metatarsal previous bunion correction right which is healed well with good alignment

## 2015-07-24 NOTE — Telephone Encounter (Signed)
An order has been placed for this patient to be referred to [facility]. In checking the status of referral, I was advised by Freda Munro at [facility], they have contacted patient to schedule, but patient has not returned their calls.  I have called the patient and left a message requesting she contact me. Upon call back, I will encourage patient to call [facility] to scheduled appt with Ileana Roup.

## 2015-07-24 NOTE — Progress Notes (Signed)
   Subjective:    Patient ID: Lauren Osborne, female    DOB: 12-16-1955, 60 y.o.   MRN: [id number]  HPI Patient presents with bilateral ankle pain; both side; pt stated, "ankles do not feel strong; have fallen twice last year"; x2 yrs.  Patient also presents with foot pain in their right foot; plantar forefoot-below 1st toe. Pt stated, "feels like walking on a piece of glass"; x2  yrs   Review of Systems  Neurological: Positive for headaches.  All other systems reviewed and are negative.      Objective:   Physical Exam        Assessment & Plan:

## 2015-07-25 NOTE — Telephone Encounter (Signed)
Patient has returned my call, she advises she has never received any message from [facility].  I have provided patient with the phone number for [facility] [phone]. Patient states she will call to scheduled appointment with Ileana Roup

## 2015-08-17 ENCOUNTER — Encounter: Payer: Self-pay | Admitting: Podiatry

## 2015-08-17 ENCOUNTER — Ambulatory Visit: Payer: Self-pay

## 2015-08-17 ENCOUNTER — Ambulatory Visit (INDEPENDENT_AMBULATORY_CARE_PROVIDER_SITE_OTHER): Payer: BC Managed Care – PPO | Admitting: Podiatry

## 2015-08-17 DIAGNOSIS — M779 Enthesopathy, unspecified: Secondary | ICD-10-CM | POA: Diagnosis not present

## 2015-08-17 DIAGNOSIS — M8930 Hypertrophy of bone, unspecified site: Secondary | ICD-10-CM

## 2015-08-17 NOTE — Patient Instructions (Signed)
Pre-Operative Instructions  Congratulations, you have decided to take an important step to improving your quality of life.  You can be assured that the doctors of [facility] will be with you every step of the way.  1. Plan to be at the surgery center/hospital at least 1 (one) hour prior to your scheduled time unless otherwise directed by the surgical center/hospital staff.  You must have a responsible adult accompany you, remain during the surgery and drive you home.  Make sure you have directions to the surgical center/hospital and know how to get there on time. 2. For hospital based surgery you will need to obtain a history and physical form from your family physician within 1 month prior to the date of surgery- we will give you a form for you primary physician.  3. We make every effort to accommodate the date you request for surgery.  There are however, times where surgery dates or times have to be moved.  We will contact you as soon as possible if a change in schedule is required.   4. No Aspirin/Ibuprofen for one week before surgery.  If you are on aspirin, any non-steroidal anti-inflammatory medications (Mobic, Aleve, Ibuprofen) you should stop taking it 7 days prior to your surgery.  You make take Tylenol  For pain prior to surgery.  5. Medications- If you are taking daily heart and blood pressure medications, seizure, reflux, allergy, asthma, anxiety, pain or diabetes medications, make sure the surgery center/hospital is aware before the day of surgery so they may notify you which medications to take or avoid the day of surgery. 6. No food or drink after midnight the night before surgery unless directed otherwise by surgical center/hospital staff. 7. No alcoholic beverages 24 hours prior to surgery.  No smoking 24 hours prior to or 24 hours after surgery. 8. Wear loose pants or shorts- loose enough to fit over bandages, boots, and casts. 9. No slip on shoes, sneakers are best. 10. Bring  your boot with you to the surgery center/hospital.  Also bring crutches or a walker if your physician has prescribed it for you.  If you do not have this equipment, it will be provided for you after surgery. 11. If you have not been contracted by the surgery center/hospital by the day before your surgery, call to confirm the date and time of your surgery. 12. Leave-time from work may vary depending on the type of surgery you have.  Appropriate arrangements should be made prior to surgery with your employer. 13. Prescriptions will be provided immediately following surgery by your doctor.  Have these filled as soon as possible after surgery and take the medication as directed. 14. Remove nail polish on the operative foot. 15. Wash the night before surgery.  The night before surgery wash the foot and leg well with the antibacterial soap provided and water paying special attention to beneath the toenails and in between the toes.  Rinse thoroughly with water and dry well with a towel.  Perform this wash unless told not to do so by your physician.  Enclosed: 1 Ice pack (please put in freezer the night before surgery)   1 Hibiclens skin cleaner   Pre-op Instructions  If you have any questions regarding the instructions, do not hesitate to call our office.  [place]: [address] [place], [place] [address] [phone]  [place]: [address] [place], [place] [address] [phone]  [place]: [address]  [place], [place] [address] [phone]  Dr. Richard   Tuchman DPM, Dr. Norman Regal DPM Dr. Richard Sikora DPM, Dr. M. Todd Hyatt DPM, Dr. Kathryn Egerton DPM 

## 2015-08-18 NOTE — Progress Notes (Signed)
Subjective:     Patient ID: Lauren Osborne, female   DOB: 1956-04-06, 60 y.o.   MRN: [id number]  HPI patient states this bone on the bottom my right foot is really sore and makes it hard to walk. 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 I do think we could consider a tibial sesamoidectomy and hope that this will reduce all the bulk of the area and should not require a extensive elevating osteotomy. This is the path that she wants to pursue and she's not interested in more aggressive procedure currently and at this time I allowed her to read a consent form concerning correction and discussed with her all possible complications associated with this including possibility for not improving and everything as listed in consent form. Patient signed consent form scheduled for outpatient surgery and also understand recovery can take around 6 months. Patient be seen back for surgery in the next few weeks and is encouraged to call with questions prior to procedure

## 2015-09-12 ENCOUNTER — Encounter: Payer: Self-pay | Admitting: Podiatry

## 2015-09-12 DIAGNOSIS — M84871 Other disorders of continuity of bone, right ankle and foot: Secondary | ICD-10-CM | POA: Diagnosis not present

## 2015-09-13 ENCOUNTER — Telehealth: Payer: Self-pay | Admitting: *Deleted

## 2015-09-13 NOTE — Telephone Encounter (Signed)
Post op courtesy call-Pt states she's doing great not having any pain has been up at her desk working today.  I told pt not to dangle or be up on her surgical foot more than 15 mins/hour, remain in the surgical boot at all times even to sleep and walk, may open the boot to rotate foot gently if resting, RICE, take pain medication as directed, and leave the original surgical dressing in place until 1st POV, and call with concerns.  Pt states understanding.

## 2015-09-22 ENCOUNTER — Ambulatory Visit (INDEPENDENT_AMBULATORY_CARE_PROVIDER_SITE_OTHER): Payer: BC Managed Care – PPO | Admitting: Podiatry

## 2015-09-22 ENCOUNTER — Ambulatory Visit (INDEPENDENT_AMBULATORY_CARE_PROVIDER_SITE_OTHER): Payer: BC Managed Care – PPO

## 2015-09-22 VITALS — Temp 98.2°F

## 2015-09-22 DIAGNOSIS — M779 Enthesopathy, unspecified: Secondary | ICD-10-CM

## 2015-09-22 DIAGNOSIS — Z9889 Other specified postprocedural states: Secondary | ICD-10-CM

## 2015-09-22 DIAGNOSIS — M8930 Hypertrophy of bone, unspecified site: Secondary | ICD-10-CM

## 2015-09-22 NOTE — Progress Notes (Signed)
Subjective:     Patient ID: Lauren Osborne, female   DOB: [birth year], 60 y.o.   MRN: [id number]  HPI patient states that she is doing fine with her foot   Review of Systems     Objective:   Physical Exam Neurovascular status intact muscle strength adequate with stitches intact on the medial side right were sesamoid was removed    Assessment:     Doing well post sesamoidectomy right    Plan:     Reviewed x-ray reapplied sterile dressing and gave instructions on continued elevation immobilization compression. Reappoint 2 weeks suture removal or earlier if needed  X-ray indicates satisfactory removal of tibial sesamoid

## 2015-10-06 ENCOUNTER — Ambulatory Visit (INDEPENDENT_AMBULATORY_CARE_PROVIDER_SITE_OTHER): Payer: BC Managed Care – PPO

## 2015-10-06 ENCOUNTER — Ambulatory Visit (INDEPENDENT_AMBULATORY_CARE_PROVIDER_SITE_OTHER): Payer: BC Managed Care – PPO | Admitting: Podiatry

## 2015-10-06 DIAGNOSIS — Z9889 Other specified postprocedural states: Secondary | ICD-10-CM | POA: Diagnosis not present

## 2015-10-06 DIAGNOSIS — M8930 Hypertrophy of bone, unspecified site: Secondary | ICD-10-CM

## 2015-10-08 NOTE — Progress Notes (Signed)
Subjective:     Patient ID: Lauren Osborne, female   DOB: 1956-01-09, 60 y.o.   MRN: [id number]  HPI patient states she's doing well with her right foot and is ready to get her stitches out   Review of Systems     Objective:   Physical Exam Neurovascular status intact with well-healing surgical site right first metatarsal medial side with wound edges well coapted and stitches intact    Assessment:     Doing well post tibial sesamoidectomy right    Plan:     Stitches removed wound edges well coapted and advised on continued compression with anklet dispensed to patient

## 2015-11-06 ENCOUNTER — Encounter: Payer: Self-pay | Admitting: Podiatry

## 2015-11-06 ENCOUNTER — Ambulatory Visit (INDEPENDENT_AMBULATORY_CARE_PROVIDER_SITE_OTHER): Payer: BC Managed Care – PPO

## 2015-11-06 ENCOUNTER — Ambulatory Visit (INDEPENDENT_AMBULATORY_CARE_PROVIDER_SITE_OTHER): Payer: BC Managed Care – PPO | Admitting: Podiatry

## 2015-11-06 DIAGNOSIS — Z9889 Other specified postprocedural states: Secondary | ICD-10-CM

## 2015-11-06 DIAGNOSIS — M216X9 Other acquired deformities of unspecified foot: Secondary | ICD-10-CM | POA: Diagnosis not present

## 2015-11-07 NOTE — Progress Notes (Signed)
Subjective:     Patient ID: Lauren Osborne, female   DOB: 1955-05-18, 60 y.o.   MRN: [id number]  HPI patient states she's doing pretty well with discomfort in the plantar aspect of the first metatarsal at times but significant improvement from preoperatively   Review of Systems     Objective:   Physical Exam Neurovascular status intact muscle strength adequate diminishment of discomfort in the plantar aspect of the first metatarsal head right were tibial sesamoid has been removed    Assessment:     Doing well post tibial sesamoidectomy right    Plan:     Discussed long-term orthotics to reduce the stress against the plantar surface and continue at this time was cushion-type shoes and not going barefoot. Reappoint 2 months or earlier if needed  X-ray indicated satisfactory resection and removal of the tibial sesamoid with minimal indications of structural bunion deformity

## 2015-12-15 ENCOUNTER — Encounter: Payer: Self-pay | Admitting: Family Medicine

## 2015-12-15 ENCOUNTER — Ambulatory Visit (INDEPENDENT_AMBULATORY_CARE_PROVIDER_SITE_OTHER): Payer: BC Managed Care – PPO

## 2015-12-15 ENCOUNTER — Ambulatory Visit (INDEPENDENT_AMBULATORY_CARE_PROVIDER_SITE_OTHER): Payer: BC Managed Care – PPO | Admitting: Family Medicine

## 2015-12-15 VITALS — BP 116/72 | HR 76 | Temp 97.7°F | Resp 18 | Ht 70.25 in | Wt 196.0 lb

## 2015-12-15 DIAGNOSIS — M79662 Pain in left lower leg: Secondary | ICD-10-CM | POA: Diagnosis not present

## 2015-12-15 DIAGNOSIS — M79661 Pain in right lower leg: Secondary | ICD-10-CM | POA: Diagnosis not present

## 2015-12-15 DIAGNOSIS — R5383 Other fatigue: Secondary | ICD-10-CM | POA: Diagnosis not present

## 2015-12-15 DIAGNOSIS — M25561 Pain in right knee: Secondary | ICD-10-CM

## 2015-12-15 DIAGNOSIS — M791 Myalgia, unspecified site: Secondary | ICD-10-CM

## 2015-12-15 DIAGNOSIS — Z789 Other specified health status: Secondary | ICD-10-CM

## 2015-12-15 DIAGNOSIS — M2550 Pain in unspecified joint: Secondary | ICD-10-CM | POA: Diagnosis not present

## 2015-12-15 LAB — SEDIMENTATION RATE: Sed Rate: 1 mm/hr (ref 0–30)

## 2015-12-15 LAB — URIC ACID: Uric Acid, Serum: 3.7 mg/dL (ref 2.5–7.0)

## 2015-12-15 LAB — TSH: TSH: 1.24 mIU/L

## 2015-12-15 LAB — CK: Total CK: 70 U/L (ref 7–177)

## 2015-12-15 LAB — RHEUMATOID FACTOR: Rhuematoid fact SerPl-aCnc: <10 IU/mL (ref <=14)

## 2015-12-15 NOTE — Patient Instructions (Addendum)
     IF you received an x-ray today, you will receive an invoice from [facility]. Please contact [facility] at [phone] with questions or concerns regarding your invoice.   IF you received labwork today, you will receive an invoice from [employer]. Please contact [facility] at [phone] with questions or concerns regarding your invoice.   Our billing staff will not be able to assist you with questions regarding bills from these companies.  You will be contacted with the lab results as soon as they are available. The fastest way to get your results is to activate your My Chart account. Instructions are located on the last page of this paperwork. If you have not heard from [place] regarding the results in 2 weeks, please contact this office.    We recommend that you schedule a mammogram for breast cancer screening. Typically, you do not need a referral to do this. Please contact a local imaging center to schedule your mammogram.  [facility] - [phone]  *ask for the Radiology Department [facility] - [phone] or [phone]  [facility] - [phone] [place] [phone] [facility] - [phone]  *ask for the [facility] - [phone]  *ask for the Radiology Department [facility] - [phone]  *ask for the [place] - [phone]  Your knee x-ray looked okay. I will check a number of tests to look for rheumatologic causes of your pain, as well as a blood clot tests, but I do not think that is the cause of your calf pain. Depending on results, may still need to meet with a rheumatologist to look into reason for your pain. If this is from fibromyalgia, the Cymbalta may be beneficial. Tylenol is also ok to take. Follow-up in the next few weeks to see how things are going.  If any worsening of symptoms, follow-up sooner.

## 2015-12-15 NOTE — Progress Notes (Signed)
Subjective:  By signing my name below, I, [name], attest that this documentation has been prepared under the direction and in the presence of [name], MD.  Electronically Signed: [name], ED Scribe. 12/13/2015. 2:17 PM.   Patient ID: Lauren Osborne, female    DOB: 1955/09/22, 60 y.o.   MRN: [id number]  HPI Chief Complaint  Patient presents with   Leg Swelling   Other    BODYACHES ALL OVER    HPI Comments: Lauren Osborne is a 60 y.o. female who presents to the [facility] complaining of leg swelling. Last seen by me in 2014. She had a sesamoidectomy on the right foot that was maybe back in May. Most recent visit 6/26 with her podiatrist. Of note when she saw me in 2014, we discussed RLS. She had taken lyrica in the past for fibromyalgia type symptoms but has some reaction to lyrica. Hx of positive ANA  Pt complains of bilateral lower leg heaviness that began a couple years ago but has become worse this summer. She locates pain from ankle to her mid thigh. She reports difficulty and pain with walking due to leg heaviness. She also notes other areas of aching pain including shoulders, arms, wrist and knees. She denies injury or fall. In the past she would find relief with walking but no longer resolves her symptoms. She has been walking more for exercise a has occasional sharp pain on the lateral and medial aspect of both shins. Pt reports recent travel from [place] 3 days ago. She denies weight changes, night sweats. She denies feeling depressed.   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   Arthralgia of right hip 2015   Hip replacement    Cystocele or rectocele with uterine prolapse    IBS (irritable bowel syndrome)    Osteoarthritis    Rectocele    Tourette's disorder 2015   Tourette's disorder with a tic that is triggered by eating. From [facility]    Past Surgical History:  Procedure Laterality Date   COLPOSCOPY  12/2009   negative   hardward removal from right foot  12/15   Dr. Wardell Honour   JOINT REPLACEMENT Right 2001   Hip replacement    Posterior vaginal repair  10/21/2012   Procedure: VAGINAL REPAIR POSTERIOR; Surgeon: Daneil Dolin, MD; Location: [facility] MAIN OR; Service: Gynecology;;   UTERINE SUSPENSION  10/21/12   Procedure: UTEROSACRAL SUSPENSION; Surgeon: Daneil Dolin, MD; Location: [place]; Service: Gynecology;;   VAGINAL HYSTERECTOMY  10/21/12   Procedure: HYSTERECTOMY VAGINAL; Surgeon: Daneil Dolin, MD; Location: [place]; Service: Gynecology; Laterality: N/A; bilateral salpingectomy   Allergies  Allergen Reactions   Cephalexin Diarrhea   Ciprofloxacin Diarrhea   Sulfa Antibiotics Other (See Comments)    Stomach cramps and diarrhea   Prior to Admission medications   Medication Sig Start Date End Date Taking? Authorizing Provider  aspirin-acetaminophen-caffeine (EXCEDRIN MIGRAINE) [phone] MG tablet Take by mouth.   Yes Historical Provider, MD  lidocaine (XYLOCAINE) 5 % ointment Apply small  amt to area no more than twice daily 07/06/15  Yes [name], MD  Cholecalciferol (D 5000) 5000 units TABS Take by mouth.    Historical Provider, MD  diazepam (VALIUM) 2 MG tablet  12/17/12   Historical Provider, MD  DULoxetine (CYMBALTA) 30 MG capsule Take 1 capsule (30 mg total) by mouth daily. Patient not taking: Reported on 12/15/2015 07/07/15   [name], MD  polyethylene glycol powder [facility]) powder Take 17 g by mouth.  10/22/12   Historical Provider, MD  solifenacin (VESICARE) 5 MG tablet Take 5 mg by mouth daily.    Historical Provider, MD  zolpidem (AMBIEN) 10 MG tablet Take 0.5 tablets (5 mg total) by mouth at bedtime as needed for sleep. 04/06/13 05/06/13  [name], MD   Social History   Social History   Marital status: Married    Spouse name: N/A   Number of children: N/A   Years of education: N/A   Occupational History   Not on file.   Social History Main Topics   Smoking status: Never Smoker   Smokeless tobacco: Never Used   Alcohol use 2.4 oz/week    4 Glasses of wine per week   Drug use: No   Sexual activity: Yes     Comment: Hysterectomy    Other Topics Concern   Not on file   Social History Narrative   No narrative on file   Review of Systems  Constitutional: Negative for chills, diaphoresis, fatigue and unexpected weight change.  Musculoskeletal: Positive for arthralgias, gait problem and myalgias.  Skin: Negative for color change, rash and wound.  Neurological: Negative for weakness and numbness.  Psychiatric/Behavioral: Negative for dysphoric mood.       Objective:   Physical Exam  Constitutional: She is oriented to person, place, and time. She appears well-developed and well-nourished. No distress.  HENT:  Head: Normocephalic and atraumatic.  Eyes: Conjunctivae are normal.  Neck: Neck supple.  Cardiovascular: Normal rate, regular rhythm and normal heart sounds.  Exam reveals no gallop and no friction rub.   No murmur heard. Pulmonary/Chest: Effort normal and breath sounds normal. She has no wheezes. She has no rales.  Musculoskeletal:  Easily ambulates to table. Able to get up from chair using hands. Calves are non tender. Right calf -39cm left calf-39cm Complains of pain in anterior musculature but diffuse pain, non focal. No bony tenderness. There is no significant effusion in legs. Full ROM bilateral knees.  Trace effusion on right knee. No focal  bony tenderness. discomfort with bilateral internal and external rotation of both shoulder. Slight antalgic gait.    Neurological: She is alert and oriented to person, place, and time.  Skin: Skin is warm and dry.  Psychiatric: She has a normal mood and affect. Her behavior is normal.  Nursing note and vitals reviewed.  Vitals:   12/15/15 1402  BP: 116/72  Pulse: 76  Resp: 18  Temp: 97.7 F (36.5 C)  TempSrc: Oral  SpO2: 98%  Weight: 196 lb (88.9 kg)  Height: 5' 10.25" (1.784 m)    Dg Knee Complete 4 Views Right  Result Date: 12/15/2015 CLINICAL DATA:  Right knee pain and swelling without known injury. EXAM: RIGHT KNEE - COMPLETE 4+ VIEW COMPARISON:  None. FINDINGS: No evidence of fracture, dislocation, or joint effusion. No evidence of arthropathy or other focal bone abnormality. Soft tissues are unremarkable. IMPRESSION: Normal right knee. Electronically Signed   By: Marijo Conception, M.D.  On: 12/15/2015 15:14    Assessment & Plan:  RANDINE ASKEY is a 60 y.o. female Other fatigue - Plan: D-dimer, quantitative (not at [facility]), Uric acid, Sedimentation rate, Rheumatoid factor, ANA, Cyclic Citrul Peptide Antibody, IGG, TSH, CK  Right knee pain - Plan: DG Knee Complete 4 Views Right  Polyarthralgia - Plan: D-dimer, quantitative (not at [facility]), Uric acid, Sedimentation rate, Rheumatoid factor, ANA, Cyclic Citrul Peptide Antibody, IGG, TSH, CK  Myalgia - Plan: D-dimer, quantitative (not at [facility]), Uric acid, Sedimentation rate, Rheumatoid factor, ANA, Cyclic Citrul Peptide Antibody, IGG, TSH, CK  Bilateral calf pain - Plan: D-dimer, quantitative (not at [facility])  History of recent travel - Plan: D-dimer, quantitative (not at [facility])  Polyarthralgia, multiple areas of myalgia. History of possible fibromyalgia may be likely cause. Labs pending as above, plan on follow-up once these are obtained to determine next that, but restart of Cymbalta may be possible, or evaluation with rheumatology.  RTC precautions if worsening.   No orders of the defined types were placed in this encounter.  Patient Instructions       IF you received an x-ray today, you will receive an invoice from [facility]. Please contact [facility] at [phone] with questions or concerns regarding your invoice.   IF you received labwork today, you will receive an invoice from [employer]. Please contact [facility] at [phone] with questions or concerns regarding your invoice.   Our billing staff will not be able to assist you with questions regarding bills from these companies.  You will be contacted with the lab results as soon as they are available. The fastest way to get your results is to activate your My Chart account. Instructions are located on the last page of this paperwork. If you have not heard from [place] regarding the results in 2 weeks, please contact this office.    We recommend that you schedule a mammogram for breast cancer screening. Typically, you do not need a referral to do this. Please contact a local imaging center to schedule your mammogram.  [facility] - [phone]  *ask for the Radiology Department [facility]) - [phone] or [phone]  [facility] - [phone] [phone] [facility] - [phone]  *ask for the [facility] - [phone]  *ask for the Radiology Department [facility] - [phone]  *ask for the [facility] - [phone]  Your knee x-ray looked okay. I will check a number of tests to look for rheumatologic causes of your pain, as well as a blood clot tests, but I do not think that is the cause of your calf pain. Depending on results, may still need to meet with a rheumatologist to look into reason for your pain. If this is from  fibromyalgia, the Cymbalta may be beneficial. Tylenol is also ok to take. Follow-up in the next few weeks to see how things are going. If any worsening of symptoms, follow-up sooner.      I personally performed the services described in this documentation, which was scribed in my presence. The recorded information has been reviewed and considered, and addended by me as needed.   Signed,   [name], MD [facility].  12/18/15 8:45 AM

## 2015-12-18 LAB — CYCLIC CITRUL PEPTIDE ANTIBODY, IGG: Cyclic Citrullin Peptide Ab: <16 Units

## 2015-12-18 LAB — ANA: Anti Nuclear Antibody(ANA): POSITIVE — AB

## 2015-12-18 LAB — ANTI-NUCLEAR AB-TITER (ANA TITER): ANA Titer 1: 1:80 {titer} — ABNORMAL HIGH

## 2015-12-19 LAB — D-DIMER, QUANTITATIVE

## 2015-12-21 ENCOUNTER — Telehealth: Payer: Self-pay

## 2015-12-21 DIAGNOSIS — M2550 Pain in unspecified joint: Secondary | ICD-10-CM

## 2015-12-21 DIAGNOSIS — R768 Other specified abnormal immunological findings in serum: Secondary | ICD-10-CM

## 2015-12-22 NOTE — Telephone Encounter (Signed)
See lab note.  

## 2016-01-05 ENCOUNTER — Other Ambulatory Visit: Payer: BC Managed Care – PPO

## 2016-01-08 NOTE — Progress Notes (Signed)
DOS 09/12/2015 removal tibial sesmoid right foot

## 2016-08-27 DIAGNOSIS — N3001 Acute cystitis with hematuria: Secondary | ICD-10-CM | POA: Diagnosis not present

## 2016-08-30 ENCOUNTER — Telehealth: Payer: Self-pay | Admitting: Obstetrics & Gynecology

## 2016-08-30 NOTE — Telephone Encounter (Signed)
Patient called and was seen in the [facility] earlier this week for a uti. Her results went to [facility] and she'd like Dr. Sabra Heck to review them if the can get access to them. She scheduled a follow up with Dr. Sabra Heck for 09/10/16 for a recheck.

## 2016-08-30 NOTE — Telephone Encounter (Signed)
Spoke with patient. Patient states she was seen at [facility] this week for UTI symptoms and was prescribed Macrobid bid for 7 days. Patient states no results have been called, would like Dr. Sabra Heck to review results and return call with any additional recommendations. Advised patient RN would call and request copy of results faxed to Dr. Sabra Heck at [phone] for review. Advised patient Dr. Sabra Heck is out of the office and will return on 09/02/16. Patient states was seen at [facility] in [place], [phone]. Patient verbalizes understanding.  Call to [facility] at [phone] -connected to automated system, will attempt to contact again on 09/02/16.

## 2016-09-02 NOTE — Telephone Encounter (Signed)
Attempted to contact [facility] to get UA results as requested by patient. Spoke with Angelica, will fax lab results dated 08/27/16 to [phone], ATTN: Sharee Pimple.

## 2016-09-03 NOTE — Telephone Encounter (Signed)
Left message to call Jill at [phone].  

## 2016-09-03 NOTE — Telephone Encounter (Signed)
Spoke with Roderic Palau at [employer] support. Advised RN requested lab results dated 08/27/16 on 4/23, never received. Was advised lab results will be faxed to [phone].

## 2016-09-04 NOTE — Telephone Encounter (Signed)
Spoke with patient. Called to updated - advised have contacted [facility] and no results received to date. Patient is going to attempt to get lab results and return call back to office. Patient states she is feeling better, symptoms resolving.  Dr. Sabra Heck -forwarding Jenkins.

## 2016-09-04 NOTE — Telephone Encounter (Signed)
Patient returned call. She'd like a call back before 11:00 AM, if possible, because she has a meeting at that time.

## 2016-09-04 NOTE — Telephone Encounter (Signed)
Spoke with patient, advised as seen below per Dr. Quincy Simmonds. Patient states she will f/u at [facility] next week on 5/1. Patient verbalizes understanding and is agreeable.  Routing to provider for final review. Patient is agreeable to disposition. Will close encounter.

## 2016-09-04 NOTE — Telephone Encounter (Signed)
Results from the urine culture show 3 or more organisms present, consistent with contamination.  No one specific organism was cultured, so this is considered a "negative" urine culture.  I am glad she is feeling better.  No further antibiotics are needed at this time.   Cc- Dr. Sabra Heck

## 2016-09-04 NOTE — Telephone Encounter (Signed)
Results received from [facility]. Results to covering provider for review and advise?  Dr. Quincy Simmonds -can you review UA results dated 08/27/16 -patient completed macrobid bid x7d?   Cc: Dr. Sabra Heck

## 2016-09-10 ENCOUNTER — Ambulatory Visit: Payer: BC Managed Care – PPO | Admitting: Obstetrics & Gynecology

## 2016-09-12 ENCOUNTER — Ambulatory Visit (INDEPENDENT_AMBULATORY_CARE_PROVIDER_SITE_OTHER): Payer: BC Managed Care – PPO | Admitting: Obstetrics & Gynecology

## 2016-09-12 ENCOUNTER — Other Ambulatory Visit (HOSPITAL_COMMUNITY)
Admission: RE | Admit: 2016-09-12 | Discharge: 2016-09-12 | Disposition: A | Discharge status: Home / Self Care | Payer: BC Managed Care – PPO | Source: Ambulatory Visit | Attending: Obstetrics & Gynecology | Admitting: Obstetrics & Gynecology

## 2016-09-12 VITALS — BP 118/70 | HR 88 | Temp 98.2°F | Resp 16 | Ht 70.25 in | Wt 199.0 lb

## 2016-09-12 DIAGNOSIS — Z23 Encounter for immunization: Secondary | ICD-10-CM | POA: Diagnosis not present

## 2016-09-12 DIAGNOSIS — R300 Dysuria: Secondary | ICD-10-CM

## 2016-09-12 DIAGNOSIS — Z205 Contact with and (suspected) exposure to viral hepatitis: Secondary | ICD-10-CM

## 2016-09-12 DIAGNOSIS — Z124 Encounter for screening for malignant neoplasm of cervix: Secondary | ICD-10-CM | POA: Insufficient documentation

## 2016-09-12 DIAGNOSIS — F952 Tourette's disorder: Secondary | ICD-10-CM

## 2016-09-12 DIAGNOSIS — Z01419 Encounter for gynecological examination (general) (routine) without abnormal findings: Secondary | ICD-10-CM | POA: Diagnosis not present

## 2016-09-12 DIAGNOSIS — R350 Frequency of micturition: Secondary | ICD-10-CM

## 2016-09-12 DIAGNOSIS — R198 Other specified symptoms and signs involving the digestive system and abdomen: Secondary | ICD-10-CM | POA: Diagnosis not present

## 2016-09-12 LAB — POCT URINALYSIS DIPSTICK
Bilirubin, UA: NEGATIVE
Glucose, UA: NEGATIVE
Ketones, UA: NEGATIVE
Nitrite, UA: NEGATIVE
Protein, UA: NEGATIVE
pH, UA: 6 (ref 5.0–8.0)

## 2016-09-12 MED ORDER — ESTRADIOL 10 MCG VA TABS: ORAL_TABLET | VAGINAL | 4 refills | Status: DC

## 2016-09-12 NOTE — Progress Notes (Signed)
Patient ID: Lauren Osborne, female   DOB: [birth year], 61 y.o.   MRN: [id number]   60 y.o. [id number] MarriedCaucasianF here for annual exam.  Denies vaginal bleeding.  States she's feeling some vaginal pressure again like before her hysterectomy and cystocele repair.  Repair was done at [facility].  Pt has experienced persistent issues with rectal and pelvic pressure.  She was referred to PT last year and went for about five visits but stopped as she didn't think this was helping.  Last year, when I saw her for the first time, we reviewed the possiblity of going back for proline suture removal.  She did not communicate with me about continued symptoms.  She does not want to have any more surgery.  Also, was prescribed Cymbalta last year.  Took it one day and then decided she did not want to continue with this.  Pt reports issues with recurrent constipation, followed by looser but "pasty" bowel movements that occur after taking Miralax for several days in a row.  Does not use unless having symptoms but, again, will use for several days in a row.  Feels uncomfortable when has the stool changes but then, once bowel movements have occurred, feels pretty good.  Has seen GI.  Refuses referral.  "I'm not going to have any more tests about my bowel movements"  Saw Dr. Carlota Raspberry.  Still having a lot of joint pain which is a long standing issues.  This past year, had a +ANA.  Declines seeing rheumatologist as she says she "gone before and they found nothing".  Reviewed with pt that most recently her ANA is abnormal and this does confirm it is rheumatologic in nature.  States she doesn't really want any new medications either so continues to decline being seen by anyone else at this time, even rheumatology.  Pt reports newer issues with urinary urgency.  Feels like she has to go to the bathroom all the time.  Things it is a UTI and wants testing today.  Has been ongoing for months.  D/w pt pt this is likely not a UTI.  Other  possibilities reviewed including menopausal changes that can cause symptoms vs OAB.  Denies incontinence.  Denies hematuria.   Lastly, pt has issues with Tourette's syndrome.  Feels her head movements have worsened.  Would like referral.  Patient's last menstrual period was 05/13/2005.          Sexually active: Yes.    The current method of family planning is status post hysterectomy and post menopausal status.    Exercising: No.  Smoker:  no  Health Maintenance: Pap:  hysterectomy History of abnormal Pap:  Yes, ASCUS with possible HGSIL MMG:  11/15 Colonoscopy:  2/16 at [facility], Dr. Carlton Adam. BMD:   2004 TDaP:  unknown Pneumonia vaccine(s):  Not indicated Zostavax:   declines Hep C testing: has not had done yet.  Declines today Screening Labs: 06/2015    reports that she has never smoked. She has never used smokeless tobacco. She reports that she drinks about 2.4 oz of alcohol per week . She reports that she does not use drugs.  Past Medical History:  Diagnosis Date  . Abnormal Pap smear of cervix 12/2009   ASCUS cannot exclude HGSIL.  [name] Arthralgia of right hip 2015   Hip replacement   . Cystocele or rectocele with uterine prolapse   . IBS (irritable bowel syndrome)   . Osteoarthritis   . Rectocele   . Tourette's disorder 2015  Tourette's disorder with a tic that is triggered by eating. From [facility]     Past Surgical History:  Procedure Laterality Date  . COLPOSCOPY  12/2009   negative  . hardward removal from right foot  12/15   Dr. Wardell Honour  . JOINT REPLACEMENT Right 2001   Hip replacement   . Posterior vaginal repair  10/21/2012   Procedure: VAGINAL REPAIR POSTERIOR; Surgeon: Daneil Dolin, MD; Location: [facility] MAIN OR; Service: Gynecology;;  . UTERINE SUSPENSION  10/21/12   Procedure: UTEROSACRAL SUSPENSION; Surgeon: Daneil Dolin, MD; Location[facility] MAIN OR; Service: Gynecology;;  . VAGINAL HYSTERECTOMY  10/21/12   Procedure: HYSTERECTOMY VAGINAL;  Surgeon: Daneil Dolin, MD; Location: [place]; Service: Gynecology; Laterality: N/A; bilateral salpingectomy    Current Outpatient Prescriptions  Medication Sig Dispense Refill  . aspirin-acetaminophen-caffeine (EXCEDRIN MIGRAINE) 250-250-65 MG tablet Take by mouth.    . lidocaine (XYLOCAINE) 5 % ointment Apply small amt to area no more than twice daily 1.25 g 1  . polyethylene glycol powder (MIRALAX) powder Take 17 g by mouth.    . solifenacin (VESICARE) 5 MG tablet Take 5 mg by mouth daily.    [name] zolpidem (AMBIEN) 10 MG tablet Take 0.5 tablets (5 mg total) by mouth at bedtime as needed for sleep. (Patient not taking: Reported on 09/12/2016) 15 tablet 1   No current facility-administered medications for this visit.     Family History  Problem Relation Age of Onset  . Cancer Father     ROS:  Pertinent items are noted in HPI.  Otherwise, a comprehensive ROS was negative.  Exam:   BP 118/70 (BP Location: Right Arm, Patient Position: Sitting, Cuff Size: Large)   Pulse 88   Temp 98.2 F (36.8 C) (Oral)   Resp 16   Ht 5' 10.25" (1.784 m)   Wt 199 lb (90.3 kg)   LMP 05/13/2005   BMI 28.35 kg/m   Weight change: -1#   Height: 5' 10.25" (178.4 cm)  Ht Readings from Last 3 Encounters:  09/12/16 5' 10.25" (1.784 m)  12/15/15 5' 10.25" (1.784 m)  07/24/15 5' 10.25" (1.784 m)    General appearance: alert, cooperative and appears stated age Head: Normocephalic, without obvious abnormality, atraumatic Neck: no adenopathy, supple, symmetrical, trachea midline and thyroid normal to inspection and palpation Lungs: clear to auscultation bilaterally Breasts: normal appearance, no masses or tenderness Heart: regular rate and rhythm Abdomen: soft, non-tender; bowel sounds normal; no masses,  no organomegaly Extremities: extremities normal, atraumatic, no cyanosis or edema Skin: Skin color, texture, turgor normal. No rashes or lesions Lymph nodes: Cervical, supraclavicular, and  axillary nodes normal. No abnormal inguinal nodes palpated Neurologic: Grossly normal   Pelvic: External genitalia:  no lesions              Urethra:  normal appearing urethra with no masses, tenderness or lesions              Bartholins and Skenes: normal                 Vagina: normal appearing vagina with normal color and discharge, no lesions, significant atrophic tissue noted              Cervix: absent              Pap taken: Yes.   Bimanual Exam:  Uterus:  uterus absent              Adnexa: no mass, fullness, tenderness  Rectovaginal: Confirms               Anus:  normal sphincter tone, no lesions  Chaperone was present for exam.  A:  Well Woman with normal exam PMP, no HRT Joint pain, +ANA.  Has refrused follow up with rheumatology fo rnow H/O TVH/bilateral salpingecotmy/SSLS now with continued rectla pain and rectal pressure urinary urgency, on vesicare Tourette's syndrome, worsening symptoms Atrophic vaginitis Constipation followed by paste-like stools once miralax is used  P:   Mammogram guidelines reviewed.  Will schedule for pt. pap smear and HR HPV obtained today Hep C antibody obtained today Tdap today Urine culture pending Trial of vagifem 50meq pv twice weekly x 14 nights, then twice weekly.  Rx to pharmacy. Referral to Dr. Carles Collet in neurology Encourage pt to try metamucil to help soften and bulk stools Return annually or prn  In addition to routine gyn exam, additional 30 minutes spent in face to face discussion of the above many complaints, issues, concerns.

## 2016-09-12 NOTE — Progress Notes (Deleted)
GYNECOLOGY  VISIT   HPI: 61 y.o. G59P2002 Married Caucasian female here for ***.  GYNECOLOGIC HISTORY: Patient's last menstrual period was 05/13/2005. Contraception: Hysterectomy  Menopausal hormone therapy: None  Patient Active Problem List   Diagnosis Date Noted  . 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 Urinary frequency 07/07/2015  . Rectal pressure 07/07/2015  . Arthralgia of hip 03/22/2014  . Arthralgia, sacroiliac 03/22/2014  . ANA positive 03/10/2014  . 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 Abnormal Pap smear of cervix 12/2009   ASCUS cannot exclude HGSIL.  [name] Arthralgia of right hip 2015   Hip replacement   . Cystocele or rectocele with uterine prolapse   . IBS (irritable bowel syndrome)   . Osteoarthritis   . Rectocele   . 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 From [facility]     Past Surgical History:  Procedure Laterality Date  . COLPOSCOPY  12/2009   negative  . hardward removal from right foot  12/15   Dr. Wardell Honour  . JOINT REPLACEMENT Right 2001   Hip replacement   . Posterior vaginal repair  10/21/2012   Procedure: VAGINAL REPAIR POSTERIOR; Surgeon: Daneil Dolin, MD; Location: [facility] MAIN OR; Service: Gynecology;;  . UTERINE SUSPENSION  10/21/12   Procedure: UTEROSACRAL SUSPENSION; Surgeon: Daneil Dolin, MD; Location: [facility] MAIN OR; Service: Gynecology;;  . VAGINAL HYSTERECTOMY  10/21/12   Procedure: HYSTERECTOMY VAGINAL; Surgeon: Daneil Dolin, MD; Location: [place]; Service: Gynecology; Laterality: N/A; bilateral salpingectomy    MEDS:  Reviewed in EPIC and UTD  ALLERGIES: Cephalexin; Ciprofloxacin; and Sulfa antibiotics  Family History  Problem Relation Age of Onset  . Cancer Father     SH:  ***  Review of Systems  Gastrointestinal: Positive  for abdominal pain, constipation and diarrhea.  Genitourinary: Positive for dysuria, frequency and urgency.  All other systems reviewed and are negative.   PHYSICAL EXAMINATION:    BP 118/70 (BP Location: Right Arm, Patient Position: Sitting, Cuff Size: Large)   Pulse 88   Temp 98.2 F (36.8 C) (Oral)   Resp 16   Ht 5' 10.25" (1.784 m)   Wt 199 lb (90.3 kg)   LMP 05/13/2005   BMI 28.35 kg/m     General appearance: alert, cooperative and appears stated age Neck: no adenopathy, supple, symmetrical, trachea midline and thyroid {CHL AMB PHY EX THYROID NORM [phone]::"normal to inspection and palpation"} CV:  {Exam; heart brief:31539} Lungs:  {pe lungs ob:314451::"clear to auscultation, no wheezes, rales or rhonchi, symmetric air entry"} Breasts: {Exam; breast:13139::"normal appearance, no masses or tenderness"} Abdomen: soft, non-tender; bowel sounds normal; no masses,  no organomegaly  Pelvic: External genitalia:  no lesions              Urethra:  normal appearing urethra with no masses, tenderness or lesions              Bartholins and Skenes: normal                 Vagina: normal appearing vagina with normal color and discharge, no lesions              Cervix: {CHL AMB PHY EX CERVIX NORM DEFAULT:[phone]::"no lesions"}              Bimanual Exam:  Uterus:  {CHL AMB PHY EX UTERUS  NORM DEFAULT:[phone]::"normal size, contour, position, consistency, mobility, non-tender"}              Adnexa: {CHL AMB PHY EX ADNEXA NO MASS [phone]::"no mass, fullness, tenderness"}              Rectovaginal: {yes no:314532}.  Confirms.              Anus:  normal sphincter tone, no lesions  Chaperone was present for exam.  Assessment: ***  Plan: ***   ~{NUMBERS; -10-45 JOINT ROM:10287} minutes spent with patient >50% of time was in face to face discussion of above.

## 2016-09-13 LAB — HEPATITIS C ANTIBODY: HCV Ab: NEGATIVE

## 2016-09-14 ENCOUNTER — Encounter: Payer: Self-pay | Admitting: Obstetrics & Gynecology

## 2016-09-14 LAB — URINE CULTURE

## 2016-09-16 LAB — CYTOLOGY - PAP
Diagnosis: NEGATIVE
HPV: NOT DETECTED

## 2016-09-23 ENCOUNTER — Ambulatory Visit: Payer: BC Managed Care – PPO | Admitting: Obstetrics & Gynecology

## 2016-10-14 DIAGNOSIS — H35341 Macular cyst, hole, or pseudohole, right eye: Secondary | ICD-10-CM | POA: Diagnosis not present

## 2016-10-15 DIAGNOSIS — H442E3 Degenerative myopia with other maculopathy, bilateral eye: Secondary | ICD-10-CM | POA: Diagnosis not present

## 2016-10-15 DIAGNOSIS — H3561 Retinal hemorrhage, right eye: Secondary | ICD-10-CM | POA: Diagnosis not present

## 2016-10-15 DIAGNOSIS — H15833 Staphyloma posticum, bilateral: Secondary | ICD-10-CM | POA: Diagnosis not present

## 2016-10-16 ENCOUNTER — Telehealth: Payer: Self-pay | Admitting: Obstetrics & Gynecology

## 2016-10-16 NOTE — Telephone Encounter (Signed)
Spoke with patient. Patient states that [place] [facility] placed a referral for her to Neurology to see Dr.Tat for Tourette's. Reports she was advised that they do not see patient's for this diagnosis and was recommended to be seen by another provider. Paitent states that she doe snot wish to see another provider. Has been having increased migraines with light sensitivity and vision changes. Was seen with the eye doctor this week and recommended to see neurology for migraines. Patient is asking if referral diagnosis can be changed to migraines so that she may schedule an appointment for further evaluation.

## 2016-10-16 NOTE — Telephone Encounter (Signed)
Patient is asking to talk with Dr.Miller's nurse regarding her referral.

## 2016-10-17 ENCOUNTER — Ambulatory Visit (INDEPENDENT_AMBULATORY_CARE_PROVIDER_SITE_OTHER): Payer: BC Managed Care – PPO | Admitting: Family Medicine

## 2016-10-17 ENCOUNTER — Encounter: Payer: Self-pay | Admitting: Family Medicine

## 2016-10-17 VITALS — BP 116/72 | HR 77 | Temp 98.0°F | Resp 18 | Ht 71.26 in | Wt 203.0 lb

## 2016-10-17 DIAGNOSIS — R112 Nausea with vomiting, unspecified: Secondary | ICD-10-CM | POA: Diagnosis not present

## 2016-10-17 DIAGNOSIS — R768 Other specified abnormal immunological findings in serum: Secondary | ICD-10-CM | POA: Diagnosis not present

## 2016-10-17 DIAGNOSIS — H538 Other visual disturbances: Secondary | ICD-10-CM | POA: Diagnosis not present

## 2016-10-17 DIAGNOSIS — R519 Headache, unspecified: Secondary | ICD-10-CM

## 2016-10-17 DIAGNOSIS — R51 Headache: Secondary | ICD-10-CM

## 2016-10-17 DIAGNOSIS — F952 Tourette's disorder: Secondary | ICD-10-CM

## 2016-10-17 LAB — GLUCOSE, POCT (MANUAL RESULT ENTRY): POC Glucose: 117 mg/dl — AB (ref 70–99)

## 2016-10-17 NOTE — Progress Notes (Addendum)
Subjective:  By signing my name below, I, Moises Blood, attest that this documentation has been prepared under the direction and in the presence of [name], MD. Electronically Signed: Moises Blood, [place]. 10/17/2016 , 4:45 PM .  Patient was seen in Room 26 .   Patient ID: Lauren Osborne, female    DOB: 12-Mar-1956, 61 y.o.   MRN: [id number] Chief Complaint  Patient presents with  . Migraine    for a while and now having vision issues   . Referral    Neurology   HPI Lauren Osborne is a 61 y.o. female Here for discussion of headache and referral to neurology. Last seen in Aug 2017 with fatigue and other concerns. She had blood work at that time with a normal sed rate, normal rheumatoid factor, positive ANA (titer 1:80), negative CCP, normal TSH, and normal CPK. She was referred to rheumatology. It appears she planned to reschedule her visit.   She was recently seen by gynecology on May 3rd, noted history of Tourette's syndrome with worsening head movements, and was referred to neurology. It appears this referral was cancelled on May 31st.   Migraine Patient has been waking up with really bad headache around her head with vision becoming blurry and photophobia ongoing for years. She's never been diagnosed with migraines in the past, but has been taking OTC [employer]-brand Excedrin-migraine. She would take it on and off throughout the day as the headaches would sometimes persists, but would usually have relief.   However, over the past week, she's felt something changed with vision issues and persistent headache for 6 days now. She describes the headache being the worst in her life. She wears hearing aids, but has some transit loss of hearing, describes "pressure feeling from inside pushing out with pulsations". Her blurry vision occurs in both eyes. She has associated nausea with this, and would usually have relief with closing her eyes.   Her referral to neurology was cancelled  because she wanted to go back to [facility] instead. She denies any known aneurysm in her family. She denies any changes in her headache. She denies fever or any new neck stiffness.   Positive ANA This is new for her. She was last seen by rheumatology about 4 years ago, and it was normal then.   Tourette's syndrome  She would have occasional jerk with her neck when she eats. She's had it since she was a child, but it's gotten worse becoming more uncontrollable and occurring more recently.   Patient Active Problem List   Diagnosis Date Noted  . 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 Urinary frequency 07/07/2015  . Rectal pressure 07/07/2015  . Arthralgia of hip 03/22/2014  . Arthralgia, sacroiliac 03/22/2014  . ANA positive 03/10/2014  . 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 Abnormal Pap smear of cervix 12/2009   ASCUS cannot exclude HGSIL.  [name] Arthralgia of right hip 2015   Hip replacement   . Cystocele or rectocele with uterine prolapse   . IBS (irritable bowel syndrome)   . Osteoarthritis   . Rectocele   . 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 From [facility]    Past Surgical History:  Procedure Laterality Date  . COLPOSCOPY  12/2009   negative  . hardward removal from right foot  12/15  Dr. Wardell Honour  . JOINT REPLACEMENT Right 2001   Hip replacement   . Posterior vaginal repair  10/21/2012   Procedure: VAGINAL REPAIR POSTERIOR; Surgeon: Daneil Dolin, MD; Location: [facility] MAIN OR; Service: Gynecology;;  . UTERINE SUSPENSION  10/21/12   Procedure: UTEROSACRAL SUSPENSION; Surgeon: Daneil Dolin, MD; Location: [facility] MAIN OR; Service: Gynecology;;  . VAGINAL HYSTERECTOMY  10/21/12   Procedure: HYSTERECTOMY VAGINAL; Surgeon: Daneil Dolin, MD; Location: [place]; Service:  Gynecology; Laterality: N/A; bilateral salpingectomy   Allergies  Allergen Reactions  . Cephalexin Diarrhea  . Ciprofloxacin Diarrhea  . Sulfa Antibiotics Other (See Comments)    Stomach cramps and diarrhea   Prior to Admission medications   Medication Sig Start Date End Date Taking? Authorizing Provider  aspirin-acetaminophen-caffeine (EXCEDRIN MIGRAINE) [phone] MG tablet Take by mouth.   Yes [provider]  Estradiol (VAGIFEM) 10 MCG TABS vaginal tablet 1 tab pv nightly for 14 days and then decrease to twice weekly 09/12/16  Yes [name], MD  lidocaine (XYLOCAINE) 5 % ointment Apply small amt to area no more than twice daily 07/06/15  Yes [name], MD  polyethylene glycol powder (MIRALAX) powder Take 17 g by mouth. 10/22/12  Yes [provider]  solifenacin (VESICARE) 5 MG tablet Take 5 mg by mouth daily.   Yes [provider]  zolpidem (AMBIEN) 10 MG tablet Take 0.5 tablets (5 mg total) by mouth at bedtime as needed for sleep. Patient not taking: Reported on 09/12/2016 04/06/13 05/06/13  [name], MD   Social History   Social History  . Marital status: Married    Spouse name: N/A  . Number of children: N/A  . Years of education: N/A   Occupational History  . Not on file.   Social History Main Topics  . Smoking status: Never Smoker  . Smokeless tobacco: Never Used  . Alcohol use 2.4 oz/week    4 Glasses of wine per week  . Drug use: No  . Sexual activity: Yes     Comment: Hysterectomy    Other Topics Concern  . Not on file   Social History Narrative  . No narrative on file   Review of Systems  Constitutional: Negative for chills, fatigue, fever and unexpected weight change.  Eyes: Positive for photophobia and visual disturbance.  Respiratory: Negative for cough.   Gastrointestinal: Positive for nausea. Negative for constipation, diarrhea and vomiting.  Musculoskeletal: Negative for neck stiffness.  Skin: Negative for  rash and wound.  Neurological: Positive for headaches. Negative for dizziness and weakness.       Objective:   Physical Exam  Constitutional: She is oriented to person, place, and time. She appears well-developed and well-nourished. No distress.  HENT:  Head: Normocephalic and atraumatic.  Temporal scalp non tender  Eyes: EOM are normal. Pupils are equal, round, and reactive to light. Right eye exhibits normal extraocular motion. Left eye exhibits normal extraocular motion.  2 beats with horizontal nystagmus, no vertical nystagmus  Neck: Neck supple.  Cardiovascular: Normal rate.   Pulmonary/Chest: Effort normal. No respiratory distress.  Musculoskeletal: Normal range of motion.  Neurological: She is alert and oriented to person, place, and time. She displays a negative Romberg sign. Gait abnormal. Coordination normal.  No pronator drift, normal finger-to-nose, unsteady with heel-toe gait  Skin: Skin is warm and dry.  Psychiatric: She has a normal mood and affect. Her behavior is normal.  Nursing note and vitals reviewed.   Vitals:  10/17/16 1600  BP: 116/72  Pulse: 77  Resp: 18  Temp: 98 F (36.7 C)  TempSrc: Oral  SpO2: 98%  Weight: 203 lb (92.1 kg)  Height: 5' 11.26" (1.81 m)   Results for orders placed or performed in visit on 10/17/16  POCT glucose (manual entry)  Result Value Ref Range   POC Glucose 117 (A) 70 - 99 mg/dl       Assessment & Plan:   SAKAI HEINLE is a 61 y.o. female Acute intractable headache, unspecified headache type - Plan: POCT glucose (manual entry)  Blurry vision, bilateral - Plan: POCT glucose (manual entry)  Worst headache of life  Nausea and vomiting, intractability of vomiting not specified, unspecified vomiting type  Positive ANA (antinuclear antibody)  Tourette's syndrome   Persistent daily headache with worsening headache past few months, increasing head movements that were similar to her Tourette's in the past. More  concerning is persistent headache for the past 7 days with blurry vision, photophobia that has not resolved, and describes this as her worst headache of her life. Minimal nystagmus on exam, slight difficulty with heel to toe walk but reports long-standing balance issues. Otherwise nonfocal neuro exam. Denies new neck stiffness fevers or vomiting. Minimal nausea this week. glucose ok.   - Will have evaluated at [facility] emergency room as she receives her other specialty care there.   -5:36 PM Discussed with emergency room physician regarding above symptoms and reason for evaluation. Based on timing of symptoms and current exam, patient transported by private vehicle by her husband. 911 precautions on the way there were discussed  - Regarding elevated ANA, advised patient to let me know when I can refer her to rheumatology, likely at [facility].   - May also need outpatient referral to neurology given head movements and history of Tourette's with increased recent headaches (depending on evaluation through emergency room today).    No orders of the defined types were placed in this encounter.  Patient Instructions   As we discussed I am concerned about your recent increases in headache, blurry vision, but most importantly this persistent headache for the past week that has been your worst one. Proceed to emergency room at [facility] as we discussed for further evaluation. If they are able to treat your headache, they may be able to refer you neurology if no other workup is needed tonight, or if they do discharge you and I need to place that referral, let me know.   Also, let me know about referral to rheumatology for your positive ANA as we discussed last year, or follow-up if needed to discuss other concerns after your emergency room visit.   IF you received an x-ray today, you will receive an invoice from [facility]. Please contact [facility] at  [phone] with questions or concerns regarding your invoice.   IF you received labwork today, you will receive an invoice from [place]. Please contact [facility] at [phone] with questions or concerns regarding your invoice.   Our billing staff will not be able to assist you with questions regarding bills from these companies.  You will be contacted with the lab results as soon as they are available. The fastest way to get your results is to activate your My Chart account. Instructions are located on the last page of this paperwork. If you have not heard from [place] regarding the results in 2 weeks, please contact this office.       I  personally performed the services described in this documentation, which was scribed in my presence. The recorded information has been reviewed and considered for accuracy and completeness, addended by me as needed, and agree with information above.  Signed,   [name], MD [facility].  10/17/16 5:29 PM

## 2016-10-17 NOTE — Patient Instructions (Addendum)
As we discussed I am concerned about your recent increases in headache, blurry vision, but most importantly this persistent headache for the past week that has been your worst one. Proceed to emergency room at [facility] as we discussed for further evaluation. If they are able to treat your headache, they may be able to refer you neurology if no other workup is needed tonight, or if they do discharge you and I need to place that referral, let me know.   Also, let me know about referral to rheumatology for your positive ANA as we discussed last year, or follow-up if needed to discuss other concerns after your emergency room visit.   IF you received an x-ray today, you will receive an invoice from [facility]. Please contact [facility] at [phone] with questions or concerns regarding your invoice.   IF you received labwork today, you will receive an invoice from [place]. Please contact [facility] at [phone] with questions or concerns regarding your invoice.   Our billing staff will not be able to assist you with questions regarding bills from these companies.  You will be contacted with the lab results as soon as they are available. The fastest way to get your results is to activate your My Chart account. Instructions are located on the last page of this paperwork. If you have not heard from [place] regarding the results in 2 weeks, please contact this office.

## 2016-10-18 DIAGNOSIS — R51 Headache: Secondary | ICD-10-CM | POA: Diagnosis not present

## 2016-10-18 DIAGNOSIS — H538 Other visual disturbances: Secondary | ICD-10-CM | POA: Diagnosis not present

## 2016-10-18 NOTE — Telephone Encounter (Signed)
Routing to provider for final review. Patient agreeable to disposition. Will close encounter.     

## 2016-10-18 NOTE — Telephone Encounter (Signed)
Spoke with patient to update on placing new referral to Dr. Carles Collet. She states she was in the Emergency Department last night on the recommendation of her Primary Care Provider to rule out an aneurysm and does not require further follow up. Patient is aware to call us if she decides to proceed with a new referral to Neurology.   Routing to provider for review prior to closing.

## 2016-10-18 NOTE — Telephone Encounter (Signed)
Yes but I don't think Dr. Carles Collet will see her for this so please refer to [place] if needed.  Thanks.

## 2016-10-25 ENCOUNTER — Encounter: Payer: Self-pay | Admitting: Family Medicine

## 2016-10-25 DIAGNOSIS — R9389 Abnormal findings on diagnostic imaging of other specified body structures: Secondary | ICD-10-CM

## 2016-10-30 ENCOUNTER — Ambulatory Visit: Payer: BC Managed Care – PPO | Admitting: Neurology

## 2016-10-31 DIAGNOSIS — H35051 Retinal neovascularization, unspecified, right eye: Secondary | ICD-10-CM | POA: Diagnosis not present

## 2016-11-07 ENCOUNTER — Ambulatory Visit
Admission: RE | Admit: 2016-11-07 | Discharge: 2016-11-07 | Disposition: A | Discharge status: Home / Self Care | Payer: BC Managed Care – PPO | Source: Ambulatory Visit | Attending: Family Medicine | Admitting: Family Medicine

## 2016-11-07 DIAGNOSIS — R9389 Abnormal findings on diagnostic imaging of other specified body structures: Secondary | ICD-10-CM

## 2016-12-12 DIAGNOSIS — H35051 Retinal neovascularization, unspecified, right eye: Secondary | ICD-10-CM | POA: Diagnosis not present

## 2016-12-12 DIAGNOSIS — H35373 Puckering of macula, bilateral: Secondary | ICD-10-CM | POA: Diagnosis not present

## 2016-12-12 DIAGNOSIS — H4423 Degenerative myopia, bilateral: Secondary | ICD-10-CM | POA: Diagnosis not present

## 2017-01-16 DIAGNOSIS — H4423 Degenerative myopia, bilateral: Secondary | ICD-10-CM | POA: Diagnosis not present

## 2017-01-16 DIAGNOSIS — H30001 Unspecified focal chorioretinal inflammation, right eye: Secondary | ICD-10-CM | POA: Diagnosis not present

## 2017-03-20 DIAGNOSIS — H4423 Degenerative myopia, bilateral: Secondary | ICD-10-CM | POA: Diagnosis not present

## 2017-03-20 DIAGNOSIS — H35372 Puckering of macula, left eye: Secondary | ICD-10-CM | POA: Diagnosis not present

## 2017-03-20 DIAGNOSIS — H4421 Degenerative myopia, right eye: Secondary | ICD-10-CM | POA: Diagnosis not present

## 2017-03-20 DIAGNOSIS — H35361 Drusen (degenerative) of macula, right eye: Secondary | ICD-10-CM | POA: Diagnosis not present

## 2017-04-24 DIAGNOSIS — N390 Urinary tract infection, site not specified: Secondary | ICD-10-CM | POA: Diagnosis not present

## 2017-05-15 ENCOUNTER — Other Ambulatory Visit: Payer: Self-pay | Admitting: Podiatry

## 2017-05-15 ENCOUNTER — Ambulatory Visit (INDEPENDENT_AMBULATORY_CARE_PROVIDER_SITE_OTHER): Payer: BC Managed Care – PPO | Admitting: Podiatry

## 2017-05-15 ENCOUNTER — Ambulatory Visit (INDEPENDENT_AMBULATORY_CARE_PROVIDER_SITE_OTHER): Payer: BC Managed Care – PPO

## 2017-05-15 ENCOUNTER — Encounter: Payer: Self-pay | Admitting: Podiatry

## 2017-05-15 DIAGNOSIS — M84375A Stress fracture, left foot, initial encounter for fracture: Secondary | ICD-10-CM

## 2017-05-15 DIAGNOSIS — M79672 Pain in left foot: Secondary | ICD-10-CM | POA: Diagnosis not present

## 2017-05-15 DIAGNOSIS — M779 Enthesopathy, unspecified: Secondary | ICD-10-CM

## 2017-05-29 DIAGNOSIS — Z961 Presence of intraocular lens: Secondary | ICD-10-CM | POA: Diagnosis not present

## 2017-05-29 DIAGNOSIS — Z9889 Other specified postprocedural states: Secondary | ICD-10-CM | POA: Diagnosis not present

## 2017-05-29 DIAGNOSIS — H4423 Degenerative myopia, bilateral: Secondary | ICD-10-CM | POA: Diagnosis not present

## 2017-06-05 ENCOUNTER — Other Ambulatory Visit: Payer: BC Managed Care – PPO | Admitting: Orthotics

## 2017-07-14 ENCOUNTER — Encounter: Payer: Self-pay | Admitting: Family Medicine

## 2017-07-14 ENCOUNTER — Ambulatory Visit (INDEPENDENT_AMBULATORY_CARE_PROVIDER_SITE_OTHER): Payer: BC Managed Care – PPO | Admitting: Otolaryngology

## 2017-07-14 DIAGNOSIS — H9313 Tinnitus, bilateral: Secondary | ICD-10-CM

## 2017-07-14 DIAGNOSIS — J3489 Other specified disorders of nose and nasal sinuses: Secondary | ICD-10-CM | POA: Diagnosis not present

## 2017-07-14 DIAGNOSIS — H903 Sensorineural hearing loss, bilateral: Secondary | ICD-10-CM | POA: Diagnosis not present

## 2017-08-05 DIAGNOSIS — H4423 Degenerative myopia, bilateral: Secondary | ICD-10-CM | POA: Diagnosis not present

## 2017-08-05 DIAGNOSIS — Z9889 Other specified postprocedural states: Secondary | ICD-10-CM | POA: Diagnosis not present

## 2017-08-05 DIAGNOSIS — Z961 Presence of intraocular lens: Secondary | ICD-10-CM | POA: Diagnosis not present

## 2017-08-28 ENCOUNTER — Ambulatory Visit (INDEPENDENT_AMBULATORY_CARE_PROVIDER_SITE_OTHER): Payer: BC Managed Care – PPO

## 2017-08-28 ENCOUNTER — Encounter: Payer: Self-pay | Admitting: Family Medicine

## 2017-08-28 ENCOUNTER — Other Ambulatory Visit: Payer: Self-pay

## 2017-08-28 ENCOUNTER — Ambulatory Visit (INDEPENDENT_AMBULATORY_CARE_PROVIDER_SITE_OTHER): Payer: BC Managed Care – PPO | Admitting: Family Medicine

## 2017-08-28 VITALS — BP 121/74 | HR 85 | Temp 97.6°F | Ht 71.0 in | Wt 196.8 lb

## 2017-08-28 DIAGNOSIS — R0781 Pleurodynia: Secondary | ICD-10-CM

## 2017-08-28 DIAGNOSIS — M79662 Pain in left lower leg: Secondary | ICD-10-CM

## 2017-08-28 DIAGNOSIS — R0789 Other chest pain: Secondary | ICD-10-CM | POA: Diagnosis not present

## 2017-08-28 DIAGNOSIS — M79661 Pain in right lower leg: Secondary | ICD-10-CM

## 2017-08-28 DIAGNOSIS — R12 Heartburn: Secondary | ICD-10-CM

## 2017-08-28 DIAGNOSIS — Z789 Other specified health status: Secondary | ICD-10-CM | POA: Diagnosis not present

## 2017-08-28 MED ORDER — MELOXICAM 7.5 MG PO TABS: 7.5000 mg | ORAL_TABLET | Freq: Every day | ORAL | 0 refills | Status: DC

## 2017-08-28 MED ORDER — OMEPRAZOLE 20 MG PO CPDR: 20.0000 mg | DELAYED_RELEASE_CAPSULE | Freq: Every day | ORAL | 1 refills | Status: DC

## 2017-08-28 NOTE — Patient Instructions (Addendum)
Pain appears to be primarily from muscles or chest wall.  Start meloxicam once per day, but with your history of heartburn symptoms, I would recommend omeprazole every day as well.  If you are having worsening heartburn on the meloxicam, stop it and just take Tylenol for now.    I did check some blood work including some screening tests for inflammation as well as blood clots, but expect this to be normal.  Follow-up in 2 days to determine next step in treatment, but likely I will have you evaluated by gastroenterologist, possibly cardiology. Return to the clinic or go to the nearest emergency room if any of your symptoms worsen or new symptoms occur.  The left toenail appears to be previous injury to the toenail with possible lifting after injury.  I would not recommend any antifungal treatment at this time, but would like to see what transpires over the next 3 to 4 weeks to decide on next treatment.  Try to avoid any shoes that are compressive or tight at the toenails in the meantime.   Chest Wall Pain Chest wall pain is pain in or around the bones and muscles of your chest. Sometimes, an injury causes this pain. Sometimes, the cause may not be known. This pain may take several weeks or longer to get better. Follow these instructions at home: Pay attention to any changes in your symptoms. Take these actions to help with your pain:  Rest as told by your health care provider.  Avoid activities that cause pain. These include any activities that use your chest muscles or your abdominal and side muscles to lift heavy items.  If directed, apply ice to the painful area: ? Put ice in a plastic bag. ? Place a towel between your skin and the bag. ? Leave the ice on for 20 minutes, 2-3 times per day.  Take over-the-counter and prescription medicines only as told by your health care provider.  Do not use tobacco products, including cigarettes, chewing tobacco, and e-cigarettes. If you need help  quitting, ask your health care provider.  Keep all follow-up visits as told by your health care provider. This is important.  Contact a health care provider if:  You have a fever.  Your chest pain becomes worse.  You have new symptoms. Get help right away if:  You have nausea or vomiting.  You feel sweaty or light-headed.  You have a cough with phlegm (sputum) or you cough up blood.  You develop shortness of breath. This information is not intended to replace advice given to you by your health care provider. Make sure you discuss any questions you have with your health care provider. Document Released: 04/29/2005 Document Revised: 09/07/2015 Document Reviewed: 07/25/2014 Elsevier Interactive Patient Education  2018 [employer].   Heartburn Heartburn is a type of pain or discomfort that can happen in the throat or chest. It is often described as a burning pain. It may also cause a bad taste in the mouth. Heartburn may feel worse when you lie down or bend over, and it is often worse at night. Heartburn may be caused by stomach contents that move back up into the esophagus (reflux). Follow these instructions at home: Take these actions to decrease your discomfort and to help avoid complications. Diet  Follow a diet as recommended by your health care provider. This may involve avoiding foods and drinks such as: ? Coffee and tea (with or without caffeine). ? Drinks that contain alcohol. ? Energy drinks  and sports drinks. ? Carbonated drinks or sodas. ? Chocolate and cocoa. ? Peppermint and mint flavorings. ? Garlic and onions. ? Horseradish. ? Spicy and acidic foods, including peppers, chili powder, curry powder, vinegar, hot sauces, and barbecue sauce. ? Citrus fruit juices and citrus fruits, such as oranges, lemons, and limes. ? Tomato-based foods, such as red sauce, chili, salsa, and pizza with red sauce. ? Fried and fatty foods, such as donuts, french fries, potato chips,  and high-fat dressings. ? High-fat meats, such as hot dogs and fatty cuts of red and white meats, such as rib eye steak, sausage, ham, and bacon. ? High-fat dairy items, such as whole milk, butter, and cream cheese.  Eat small, frequent meals instead of large meals.  Avoid drinking large amounts of liquid with your meals.  Avoid eating meals during the 2-3 hours before bedtime.  Avoid lying down right after you eat.  Do not exercise right after you eat. General instructions  Pay attention to any changes in your symptoms.  Take over-the-counter and prescription medicines only as told by your health care provider. Do not take aspirin, ibuprofen, or other NSAIDs unless your health care provider told you to do so.  Do not use any tobacco products, including cigarettes, chewing tobacco, and e-cigarettes. If you need help quitting, ask your health care provider.  Wear loose-fitting clothing. Do not wear anything tight around your waist that causes pressure on your abdomen.  Raise (elevate) the head of your bed about 6 inches (15 cm).  Try to reduce your stress, such as with yoga or meditation. If you need help reducing stress, ask your health care provider.  If you are overweight, reduce your weight to an amount that is healthy for you. Ask your health care provider for guidance about a safe weight loss goal.  Keep all follow-up visits as told by your health care provider. This is important. Contact a health care provider if:  You have new symptoms.  You have unexplained weight loss.  You have difficulty swallowing, or it hurts to swallow.  You have wheezing or a persistent cough.  Your symptoms do not improve with treatment.  You have frequent heartburn for more than two weeks. Get help right away if:  You have pain in your arms, neck, jaw, teeth, or back.  You feel sweaty, dizzy, or light-headed.  You have chest pain or shortness of breath.  You vomit and your vomit  looks like blood or coffee grounds.  Your stool is bloody or black. This information is not intended to replace advice given to you by your health care provider. Make sure you discuss any questions you have with your health care provider. Document Released: 09/15/2008 Document Revised: 10/05/2015 Document Reviewed: 08/24/2014 Elsevier Interactive Patient Education  2018 [employer].   IF you received an x-ray today, you will receive an invoice from [facility]. Please contact [facility] at [phone] with questions or concerns regarding your invoice.   IF you received labwork today, you will receive an invoice from [facility]. Please contact [facility] at [phone] with questions or concerns regarding your invoice.   Our billing staff will not be able to assist you with questions regarding bills from these companies.  You will be contacted with the lab results as soon as they are available. The fastest way to get your results is to activate your My Chart account. Instructions are located on the last page of this paperwork. If you have not heard from [place]  regarding the results in 2 weeks, please contact this office.

## 2017-08-28 NOTE — Progress Notes (Signed)
Subjective:  By signing my name below, I, Lauren Osborne, attest that this documentation has been prepared under the direction and in the presence of Lauren Agreste, MD Electronically Signed: Ladene Artist, ED Scribe 08/28/2017 at 3:14 PM.   Patient ID: Lauren Osborne, female    DOB: May 27, 1955, 61 y.o.   MRN: [id number]  Chief Complaint  Patient presents with  . Painful Sternum    X2 weeks (feels like someone punched her in the chest) Thinks it may be Gurd or something with Acid in stomach  . Dark Toenail    left Big toe    HPI Lauren Osborne is a 62 y.o. female who presents to Primary Care at [facility] complaining of constant central cp around the sternum x 2 wks. Pt describes pain as soreness that is exacerbated with deep breaths and palpation. Reports a "catching sensation" that she describes as a running stitch under the bra area that resolves only with laying down an extending her body. Also reports that she has had increased heartburn over the past month with food getting stuck twice/month. States  acid has been washing back up a little less than twice/wk. She took a 14 day course of omeprazole twice/wk a month ago, intermittently took it after the course but switched to Mobic 3 days ago and 2 tabs of Excedrin daily 3 days ago with some relief. She does report doing rowing exercising one day last wk but denies injury. Reports h/o hairline fracture in sternum a few yrs ago while doing bicep dips. Also reports that her calves have felt a little tight and fatigue with walking but denies soreness or swelling. Denies blood in stools, melena, abdominal pain, vomiting, cp with exertion, sob, diaphoresis, lightheadedness, dizziness, leg swelling. No family h/o heart disease, h/o DVT/PE. She did travel to [place] in March.  L Great Toenail Pt noticed darkening of the L great toenail over the past wk. States it has always been yellow in color but she recently wore a pair of tight fitting shoes  which she thinks worsened the toenail. Denies injury.  Patient Active Problem List   Diagnosis Date Noted  . 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 Urinary frequency 07/07/2015  . Rectal pressure 07/07/2015  . Arthralgia of hip 03/22/2014  . Arthralgia, sacroiliac 03/22/2014  . ANA positive 03/10/2014  . 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 Abnormal Pap smear of cervix 12/2009   ASCUS cannot exclude HGSIL.  [name] Arthralgia of right hip 2015   Hip replacement   . Cystocele or rectocele with uterine prolapse   . IBS (irritable bowel syndrome)   . Osteoarthritis   . Rectocele   . 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 From [facility]    Past Surgical History:  Procedure Laterality Date  . COLPOSCOPY  12/2009   negative  . hardward removal from right foot  12/15   Dr. Wardell Honour  . JOINT REPLACEMENT Right 2001   Hip replacement   . Posterior vaginal repair  10/21/2012   Procedure: VAGINAL REPAIR POSTERIOR; Surgeon: Daneil Dolin, MD; Location: [facility] MAIN OR; Service: Gynecology;;  . UTERINE SUSPENSION  10/21/12   Procedure: UTEROSACRAL SUSPENSION; Surgeon: Daneil Dolin, MD; Location: [facility] MAIN OR; Service: Gynecology;;  . VAGINAL HYSTERECTOMY  10/21/12   Procedure: HYSTERECTOMY VAGINAL; Surgeon: Daneil Dolin, MD;  Location: [facility] MAIN OR; Service: Gynecology; Laterality: N/A; bilateral salpingectomy   Allergies  Allergen Reactions  . Cephalexin Diarrhea  . Ciprofloxacin Diarrhea  . Sulfa Antibiotics Other (See Comments)    Stomach cramps and diarrhea   Prior to Admission medications   Medication Sig Start Date End Date Taking? Authorizing Provider  aspirin-acetaminophen-caffeine (EXCEDRIN MIGRAINE) [phone] MG tablet Take by mouth.   Yes [provider]  Estradiol (VAGIFEM) 10  MCG TABS vaginal tablet 1 tab pv nightly for 14 days and then decrease to twice weekly 09/12/16  Yes [name], MD  lidocaine (XYLOCAINE) 5 % ointment Apply small amt to area no more than twice daily 07/06/15  Yes [name], MD  polyethylene glycol powder (MIRALAX) powder Take 17 g by mouth. 10/22/12  Yes [provider]  solifenacin (VESICARE) 5 MG tablet Take 5 mg by mouth daily.   Yes [provider]   Social History   Socioeconomic History  . Marital status: Married    Spouse name: Not on file  . Number of children: Not on file  . Years of education: Not on file  . Highest education level: Not on file  Occupational History  . Not on file  Social Needs  . Financial resource strain: Not on file  . Food insecurity:    Worry: Not on file    Inability: Not on file  . Transportation needs:    Medical: Not on file    Non-medical: Not on file  Tobacco Use  . Smoking status: Never Smoker  . Smokeless tobacco: Never Used  Substance and Sexual Activity  . Alcohol use: Yes    Alcohol/week: 2.4 oz    Types: 4 Glasses of wine per week  . Drug use: No  . Sexual activity: Yes    Comment: Hysterectomy   Lifestyle  . Physical activity:    Days per week: Not on file    Minutes per session: Not on file  . Stress: Not on file  Relationships  . Social connections:    Talks on phone: Not on file    Gets together: Not on file    Attends religious service: Not on file    Active member of club or organization: Not on file    Attends meetings of clubs or organizations: Not on file    Relationship status: Not on file  . Intimate partner violence:    Fear of current or ex partner: Not on file    Emotionally abused: Not on file    Physically abused: Not on file    Forced sexual activity: Not on file  Other Topics Concern  . Not on file  Social History Narrative  . Not on file   Review of Systems  Constitutional: Negative for diaphoresis.  Respiratory: Negative  for shortness of breath.   Cardiovascular: Positive for chest pain. Negative for leg swelling.  Gastrointestinal: Negative for abdominal pain, blood in stool and vomiting.  Neurological: Negative for dizziness and light-headedness.      Objective:   Physical Exam  Constitutional: She is oriented to person, place, and time. She appears well-developed and well-nourished. No distress.  HENT:  Head: Normocephalic and atraumatic.  Eyes: Conjunctivae and EOM are normal.  Neck: Neck supple. No tracheal deviation present.  Cardiovascular: Normal rate.  Pulmonary/Chest: Effort normal. No respiratory distress.  Reproducible pain on lower sternum.  Musculoskeletal: Normal range of motion.  Slight discomfort on posterior calves bilaterally. Slight tired feeling with Bevelyn Buckles  testing.  Neurological: She is alert and oriented to person, place, and time.  Skin: Skin is warm and dry.  L great toenail: thickening distally but darker at the base of the toenail. Appears to be ecchymosis 75% of nail primarily proximal aspect.  Psychiatric: She has a normal mood and affect. Her behavior is normal.  Nursing note and vitals reviewed.  Vitals:   08/28/17 1426  BP: 121/74  Pulse: 85  Temp: 97.6 F (36.4 C)  TempSrc: Oral  SpO2: 98%  Weight: 196 lb 12.8 oz (89.3 kg)  Height: 5\' 11"  (1.803 m)   EKG reading done by Lauren Agreste, MD: sinus rhythm rate of 78. RSR' V1V2 down QR and III with flat/nonspecific T-wave. No prior EKG available for comparison.  Dg Chest 2 View  Result Date: 08/28/2017 CLINICAL DATA:  Chest wall and sternal pain. EXAM: STERNUM - 2+ VIEW; CHEST - 2 VIEW COMPARISON:  None. FINDINGS: Sternal: There is no evidence of fracture or other focal bone lesions. Chest: Cardiomediastinal silhouette is normal. No pleural effusions or focal consolidations. Hyperinflation. Trachea projects midline and there is no pneumothorax. Soft tissue planes and included osseous structures are non-suspicious.  IMPRESSION: Hyperinflation without focal consolidation.  Negative sternum. Electronically Signed   By: Elon Alas M.D.   On: 08/28/2017 16:11   Dg Sternum  Result Date: 08/28/2017 CLINICAL DATA:  Chest wall and sternal pain. EXAM: STERNUM - 2+ VIEW; CHEST - 2 VIEW COMPARISON:  None. FINDINGS: Sternal: There is no evidence of fracture or other focal bone lesions. Chest: Cardiomediastinal silhouette is normal. No pleural effusions or focal consolidations. Hyperinflation. Trachea projects midline and there is no pneumothorax. Soft tissue planes and included osseous structures are non-suspicious. IMPRESSION: Hyperinflation without focal consolidation.  Negative sternum. Electronically Signed   By: Elon Alas M.D.   On: 08/28/2017 16:11      Assessment & Plan:    SINDIA KOWALCZYK is a 62 y.o. female Other chest pain - Plan: EKG 12-Lead, CBC, DG Chest 2 View Chest wall pain - Plan: DG Sternum, DG Chest 2 View, meloxicam (MOBIC) 7.5 MG tablet Bilateral calf pain - Plan: D-dimer, quantitative (not at [facility]) Heartburn - Plan: CBC, omeprazole (PRILOSEC) 20 MG capsule, CANCELED: H. pylori breath test History of recent air travel - Plan: D-dimer, quantitative (not at [facility]) Pleuritic chest pain - Plan: D-dimer, quantitative (not at [facility]), Sedimentation Rate Sternal pain - Plan: DG Sternum  -May have multiple contributing causes for pain including heartburn, reproducible chest wall pain.  Less likely DVT/PE given bilateral calf symptoms without appreciable unilateral edema.  No apparent acute findings on EKG, reassuring x-rays as above.  -D-dimer, sed rate, CBC obtained, all reassuring.  -Start meloxicam 7.5 mg daily, omeprazole 20 mg daily, then discussed follow-up in 2 days to decide on next step, specifically cardiology or gastroenterology follow-up.  ER precautions discussed if worsening symptoms.  Meds ordered this encounter  Medications  . omeprazole (PRILOSEC) 20 MG capsule    Sig:  Take 1 capsule (20 mg total) by mouth daily.    Dispense:  30 capsule    Refill:  1  . meloxicam (MOBIC) 7.5 MG tablet    Sig: Take 1 tablet (7.5 mg total) by mouth daily.    Dispense:  30 tablet    Refill:  0   Patient Instructions    Pain appears to be primarily from muscles or chest wall.  Start meloxicam once per day, but with your history of heartburn symptoms,  I would recommend omeprazole every day as well.  If you are having worsening heartburn on the meloxicam, stop it and just take Tylenol for now.    I did check some blood work including some screening tests for inflammation as well as blood clots, but expect this to be normal.  Follow-up in 2 days to determine next step in treatment, but likely I will have you evaluated by gastroenterologist, possibly cardiology. Return to the clinic or go to the nearest emergency room if any of your symptoms worsen or new symptoms occur.  The left toenail appears to be previous injury to the toenail with possible lifting after injury.  I would not recommend any antifungal treatment at this time, but would like to see what transpires over the next 3 to 4 weeks to decide on next treatment.  Try to avoid any shoes that are compressive or tight at the toenails in the meantime.   Chest Wall Pain Chest wall pain is pain in or around the bones and muscles of your chest. Sometimes, an injury causes this pain. Sometimes, the cause may not be known. This pain may take several weeks or longer to get better. Follow these instructions at home: Pay attention to any changes in your symptoms. Take these actions to help with your pain:  Rest as told by your health care provider.  Avoid activities that cause pain. These include any activities that use your chest muscles or your abdominal and side muscles to lift heavy items.  If directed, apply ice to the painful area: ? Put ice in a plastic bag. ? Place a towel between your skin and the bag. ? Leave the ice  on for 20 minutes, 2-3 times per day.  Take over-the-counter and prescription medicines only as told by your health care provider.  Do not use tobacco products, including cigarettes, chewing tobacco, and e-cigarettes. If you need help quitting, ask your health care provider.  Keep all follow-up visits as told by your health care provider. This is important.  Contact a health care provider if:  You have a fever.  Your chest pain becomes worse.  You have new symptoms. Get help right away if:  You have nausea or vomiting.  You feel sweaty or light-headed.  You have a cough with phlegm (sputum) or you cough up blood.  You develop shortness of breath. This information is not intended to replace advice given to you by your health care provider. Make sure you discuss any questions you have with your health care provider. Document Released: 04/29/2005 Document Revised: 09/07/2015 Document Reviewed: 07/25/2014 Elsevier Interactive Patient Education  2018 [employer].   Heartburn Heartburn is a type of pain or discomfort that can happen in the throat or chest. It is often described as a burning pain. It may also cause a bad taste in the mouth. Heartburn may feel worse when you lie down or bend over, and it is often worse at night. Heartburn may be caused by stomach contents that move back up into the esophagus (reflux). Follow these instructions at home: Take these actions to decrease your discomfort and to help avoid complications. Diet  Follow a diet as recommended by your health care provider. This may involve avoiding foods and drinks such as: ? Coffee and tea (with or without caffeine). ? Drinks that contain alcohol. ? Energy drinks and sports drinks. ? Carbonated drinks or sodas. ? Chocolate and cocoa. ? Peppermint and mint flavorings. ? Garlic and onions. ? Horseradish. ?  Spicy and acidic foods, including peppers, chili powder, curry powder, vinegar, hot sauces, and  barbecue sauce. ? Citrus fruit juices and citrus fruits, such as oranges, lemons, and limes. ? Tomato-based foods, such as red sauce, chili, salsa, and pizza with red sauce. ? Fried and fatty foods, such as donuts, french fries, potato chips, and high-fat dressings. ? High-fat meats, such as hot dogs and fatty cuts of red and white meats, such as rib eye steak, sausage, ham, and bacon. ? High-fat dairy items, such as whole milk, butter, and cream cheese.  Eat small, frequent meals instead of large meals.  Avoid drinking large amounts of liquid with your meals.  Avoid eating meals during the 2-3 hours before bedtime.  Avoid lying down right after you eat.  Do not exercise right after you eat. General instructions  Pay attention to any changes in your symptoms.  Take over-the-counter and prescription medicines only as told by your health care provider. Do not take aspirin, ibuprofen, or other NSAIDs unless your health care provider told you to do so.  Do not use any tobacco products, including cigarettes, chewing tobacco, and e-cigarettes. If you need help quitting, ask your health care provider.  Wear loose-fitting clothing. Do not wear anything tight around your waist that causes pressure on your abdomen.  Raise (elevate) the head of your bed about 6 inches (15 cm).  Try to reduce your stress, such as with yoga or meditation. If you need help reducing stress, ask your health care provider.  If you are overweight, reduce your weight to an amount that is healthy for you. Ask your health care provider for guidance about a safe weight loss goal.  Keep all follow-up visits as told by your health care provider. This is important. Contact a health care provider if:  You have new symptoms.  You have unexplained weight loss.  You have difficulty swallowing, or it hurts to swallow.  You have wheezing or a persistent cough.  Your symptoms do not improve with treatment.  You have  frequent heartburn for more than two weeks. Get help right away if:  You have pain in your arms, neck, jaw, teeth, or back.  You feel sweaty, dizzy, or light-headed.  You have chest pain or shortness of breath.  You vomit and your vomit looks like blood or coffee grounds.  Your stool is bloody or black. This information is not intended to replace advice given to you by your health care provider. Make sure you discuss any questions you have with your health care provider. Document Released: 09/15/2008 Document Revised: 10/05/2015 Document Reviewed: 08/24/2014 Elsevier Interactive Patient Education  2018 [employer].   IF you received an x-ray today, you will receive an invoice from [facility]. Please contact [facility] at [phone] with questions or concerns regarding your invoice.   IF you received labwork today, you will receive an invoice from [facility]. Please contact [facility] at [phone] with questions or concerns regarding your invoice.   Our billing staff will not be able to assist you with questions regarding bills from these companies.  You will be contacted with the lab results as soon as they are available. The fastest way to get your results is to activate your My Chart account. Instructions are located on the last page of this paperwork. If you have not heard from [place] regarding the results in 2 weeks, please contact this office.       I personally performed the services described in this  documentation, which was scribed in my presence. The recorded information has been reviewed and considered for accuracy and completeness, addended by me as needed, and agree with information above.  Signed,   [name], MD Primary Care at [place].  08/30/17 10:05 PM

## 2017-08-29 LAB — CBC
Hematocrit: 45.3 % (ref 34.0–46.6)
Hemoglobin: 15.3 g/dL (ref 11.1–15.9)
MCH: 29.8 pg (ref 26.6–33.0)
MCHC: 33.8 g/dL (ref 31.5–35.7)
MCV: 88 fL (ref 79–97)
Platelets: 325 10*3/uL (ref 150–379)
RBC: 5.13 x10E6/uL (ref 3.77–5.28)
RDW: 13.9 % (ref 12.3–15.4)
WBC: 9.2 10*3/uL (ref 3.4–10.8)

## 2017-08-29 LAB — SEDIMENTATION RATE: Sed Rate: 2 mm/hr (ref 0–40)

## 2017-08-29 LAB — D-DIMER, QUANTITATIVE: D-DIMER: 0.37 mg/L FEU (ref 0.00–0.49)

## 2017-08-30 ENCOUNTER — Ambulatory Visit: Payer: BC Managed Care – PPO | Admitting: Family Medicine

## 2017-09-01 ENCOUNTER — Telehealth: Payer: Self-pay | Admitting: Family Medicine

## 2017-09-01 NOTE — Telephone Encounter (Signed)
Copied from [place] [id number]. Topic: Quick Communication - See Telephone Encounter >> Sep 01, 2017  2:53 PM Rutherford Nail, NT wrote: CRM for notification. See Telephone encounter for: 09/01/17. Patient calling and is wondering what the next steps for her are pertaining to her possible gurd? Patient would like a call back to discuss whether she needs a follow up appointment or if a referral can be placed for a GI consult. Please advise. CB#: [phone]

## 2017-09-01 NOTE — Telephone Encounter (Signed)
Advice

## 2017-09-02 NOTE — Telephone Encounter (Signed)
Mychart message sent to pt to call and make an apt with dr Carlota Raspberry

## 2017-09-02 NOTE — Telephone Encounter (Signed)
See last ov.  Planned for 2 day follow up to discuss response to treatment and determine next step based on primary symptoms. Please schedule OV.

## 2017-09-02 NOTE — Telephone Encounter (Signed)
OV needed per Dr. Carlota Raspberry.

## 2017-09-23 ENCOUNTER — Encounter: Payer: Self-pay | Admitting: Obstetrics & Gynecology

## 2017-09-23 ENCOUNTER — Telehealth: Payer: Self-pay | Admitting: Obstetrics & Gynecology

## 2017-09-23 ENCOUNTER — Other Ambulatory Visit: Payer: Self-pay

## 2017-09-23 ENCOUNTER — Ambulatory Visit (INDEPENDENT_AMBULATORY_CARE_PROVIDER_SITE_OTHER): Payer: BC Managed Care – PPO | Admitting: Obstetrics & Gynecology

## 2017-09-23 VITALS — BP 108/60 | HR 88 | Resp 16 | Ht 71.0 in | Wt 193.8 lb

## 2017-09-23 DIAGNOSIS — R198 Other specified symptoms and signs involving the digestive system and abdomen: Secondary | ICD-10-CM

## 2017-09-23 DIAGNOSIS — R768 Other specified abnormal immunological findings in serum: Secondary | ICD-10-CM | POA: Diagnosis not present

## 2017-09-23 DIAGNOSIS — R29898 Other symptoms and signs involving the musculoskeletal system: Secondary | ICD-10-CM | POA: Diagnosis not present

## 2017-09-23 DIAGNOSIS — R300 Dysuria: Secondary | ICD-10-CM

## 2017-09-23 LAB — POCT URINALYSIS DIPSTICK
Bilirubin, UA: NEGATIVE
Glucose, UA: NEGATIVE
Ketones, UA: NEGATIVE
Nitrite, UA: NEGATIVE
Protein, UA: NEGATIVE
Urobilinogen, UA: 0.2 E.U./dL
pH, UA: 5 (ref 5.0–8.0)

## 2017-09-23 MED ORDER — NITROFURANTOIN MONOHYD MACRO 100 MG PO CAPS: 100.0000 mg | ORAL_CAPSULE | Freq: Two times a day (BID) | ORAL | 0 refills | Status: DC

## 2017-09-23 NOTE — Progress Notes (Signed)
GYNECOLOGY  VISIT  CC:   dysuria  HPI: 62 y.o. G42P2002 Married Caucasian female here for pain with urination x 4 days.  Reports she went on a long walk with her family on Saturday.  After the walk, she had significant pelvic pain down low, right in the midline.  This has continued.  Denies new back pain or fever.  Has no abnormal vaginal bleeding ir discharge.  Denies hematuria.  Does not think urine has looked cloudy.  Feels urine had strong odor but that it is improved with being on a modified keto diet this past month.    Reports her last episode of intercourse was three weeks ago and does wonder if current symptoms are related.    Reports, as a separate issue, that she is having increased issues with fatigue in her legs.  She wonders if this is related to her current symptoms.  Has positive ANA that has been obtained several times.   D/w pt importance if figuring out cause of this elevated ANA.  She would like to see Dr. Estanislado Pandy again for additional evaluatoin.  Continues to have issues with stool evacuation.  Will have issues with difficulty with bowel movements followed by looser, softer and mushier stools that "get everywhere" at times.  Knows this increases risks of bladder infection.  Has experienced issues with bowel movements since having surgery for hysterectomy and cystocele repair.  GYNECOLOGIC HISTORY: Patient's last menstrual period was 05/13/2005. Contraception: post menopausal  Menopausal hormone therapy: none   Patient Active Problem List   Diagnosis Date Noted  . 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 Urinary frequency 07/07/2015  . Rectal pressure 07/07/2015  . Arthralgia of hip 03/22/2014  . Arthralgia, sacroiliac 03/22/2014  . ANA positive 03/10/2014  . 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 Abnormal Pap smear of cervix 12/2009   ASCUS cannot  exclude HGSIL.  [name] Arthralgia of right hip 2015   Hip replacement   . Cystocele or rectocele with uterine prolapse   . IBS (irritable bowel syndrome)   . Osteoarthritis   . Rectocele   . 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 From [facility]     Past Surgical History:  Procedure Laterality Date  . COLPOSCOPY  12/2009   negative  . hardward removal from right foot  12/15   Dr. Wardell Honour  . JOINT REPLACEMENT Right 2001   Hip replacement   . Posterior vaginal repair  10/21/2012   Procedure: VAGINAL REPAIR POSTERIOR; Surgeon: Daneil Dolin, MD; Location: [facility] MAIN OR; Service: Gynecology;;  . UTERINE SUSPENSION  10/21/12   Procedure: UTEROSACRAL SUSPENSION; Surgeon: Daneil Dolin, MD; Location: [facility] MAIN OR; Service: Gynecology;;  . VAGINAL HYSTERECTOMY  10/21/12   Procedure: HYSTERECTOMY VAGINAL; Surgeon: Daneil Dolin, MD; Location: [place]; Service: Gynecology; Laterality: N/A; bilateral salpingectomy    MEDS:   Current Outpatient Medications on File Prior to Visit  Medication Sig Dispense Refill  . aspirin-acetaminophen-caffeine (EXCEDRIN MIGRAINE) 250-250-65 MG tablet Take by mouth.    . Estradiol (VAGIFEM) 10 MCG TABS vaginal tablet 1 tab pv nightly for 14 days and then decrease to twice weekly 18 tablet 4  . lidocaine (XYLOCAINE) 5 % ointment Apply small amt to area no more than twice daily 1.25 g 1  . meloxicam (MOBIC) 7.5 MG tablet Take 1 tablet (  7.5 mg total) by mouth daily. 30 tablet 0  . omeprazole (PRILOSEC) 20 MG capsule Take 1 capsule (20 mg total) by mouth daily. 30 capsule 1  . polyethylene glycol powder (MIRALAX) powder Take 17 g by mouth.    . solifenacin (VESICARE) 5 MG tablet Take 5 mg by mouth daily.     No current facility-administered medications on file prior to visit.     ALLERGIES: Cephalexin; Ciprofloxacin; and Sulfa antibiotics  Family History  Problem Relation Age of  Onset  . Cancer Father     SH:  Married, non smoker  Review of Systems  All other systems reviewed and are negative.   PHYSICAL EXAMINATION:    BP 108/60 (BP Location: Left Arm, Patient Position: Sitting, Cuff Size: Large)   Pulse 88   Resp 16   Ht 5\' 11"  (1.803 m)   Wt 193 lb 12.8 oz (87.9 kg)   LMP 05/13/2005   BMI 27.03 kg/m     General appearance: alert, cooperative and appears stated age Abdomen: soft, non-tender; bowel sounds normal; no masses,  no organomegaly  Pelvic: External genitalia:  no lesions              Urethra:  normal appearing urethra with no masses, tenderness or lesions              Bartholins and Skenes: normal                 Vagina: normal appearing vagina with normal color and discharge, no lesions              Cervix: absent              Bimanual Exam:  Uterus:  uterus absent              Adnexa: no mass, fullness, tenderness  Chaperone was present for exam.  Assessment: Dysuria Continued issues with stool elimination Leg weakness Elevated ANA, on several different sets of blood work  Plan: Urine micro and culture are pending Macrobid bid x 5 days for pharmacy Referral to Dr. Estanislado Pandy Vagifem 45meq pv twice weekly #24/4RF.   ~25 minutes spent with patient >50% of time was in face to face discussion of above.

## 2017-09-23 NOTE — Telephone Encounter (Signed)
Spoke with patient. Reports burning at urethra when voiding and voiding small amounts. Has increased fluids with no changes. Symptoms started 5 days ago.   Denies blood in urine, fever/chills, N/V.  No longer using vagifem. Patient states this has be an issue in the past related to bowel movements and "poop stalling".  Patient requesting OV. OV scheduled for today at 3:30pm with Dr. Sabra Heck.   Last AEX 09/12/16  Routing to provider for final review. Patient is agreeable to disposition. Will close encounter.

## 2017-09-23 NOTE — Telephone Encounter (Signed)
Patient is asking to come in after 3:00pm today to see Dr.Miller for a possible uti. To triage to assist with scheduling.

## 2017-09-24 LAB — URINALYSIS, MICROSCOPIC ONLY
Casts: NONE SEEN /lpf
WBC, UA: >30 /hpf — AB (ref 0–5)

## 2017-09-25 LAB — URINE CULTURE

## 2017-09-26 ENCOUNTER — Other Ambulatory Visit: Payer: Self-pay | Admitting: Family Medicine

## 2017-09-26 DIAGNOSIS — R0789 Other chest pain: Secondary | ICD-10-CM

## 2017-09-26 MED ORDER — ESTRADIOL 10 MCG VA TABS: 1.0000 | ORAL_TABLET | VAGINAL | 3 refills | Status: DC

## 2017-09-26 NOTE — Telephone Encounter (Signed)
Refill?  Controlled and also patient was last seen on 08/28/2017

## 2017-09-27 NOTE — Telephone Encounter (Signed)
Refilled once, but please follow-up to discuss symptoms further if still requiring that medication daily.  Thanks

## 2017-10-03 ENCOUNTER — Ambulatory Visit (INDEPENDENT_AMBULATORY_CARE_PROVIDER_SITE_OTHER): Payer: BC Managed Care – PPO | Admitting: Obstetrics & Gynecology

## 2017-10-03 ENCOUNTER — Other Ambulatory Visit: Payer: Self-pay

## 2017-10-03 ENCOUNTER — Telehealth: Payer: Self-pay | Admitting: Obstetrics & Gynecology

## 2017-10-03 VITALS — BP 130/80 | HR 80 | Resp 14 | Ht 70.5 in | Wt 188.1 lb

## 2017-10-03 DIAGNOSIS — R8290 Unspecified abnormal findings in urine: Secondary | ICD-10-CM | POA: Diagnosis not present

## 2017-10-03 DIAGNOSIS — R198 Other specified symptoms and signs involving the digestive system and abdomen: Secondary | ICD-10-CM | POA: Diagnosis not present

## 2017-10-03 MED ORDER — FLUCONAZOLE 150 MG PO TABS: 150.0000 mg | ORAL_TABLET | Freq: Once | ORAL | 0 refills | Status: AC

## 2017-10-03 MED ORDER — AMPICILLIN 500 MG PO CAPS
500.0000 mg | ORAL_CAPSULE | Freq: Four times a day (QID) | ORAL | 0 refills | Status: DC
Start: 2017-10-03 — End: 2017-10-03

## 2017-10-03 MED ORDER — AMPICILLIN 500 MG PO CAPS: 500.0000 mg | ORAL_CAPSULE | Freq: Three times a day (TID) | ORAL | 0 refills | Status: AC

## 2017-10-03 MED ORDER — AMPICILLIN 250 MG PO CAPS: 250.0000 mg | ORAL_CAPSULE | Freq: Four times a day (QID) | ORAL | 0 refills | Status: DC

## 2017-10-03 NOTE — Telephone Encounter (Signed)
Left message to call Kaitlyn at [phone]. 

## 2017-10-03 NOTE — Addendum Note (Signed)
Addended by: Burnice Logan on: 10/03/2017 02:59 PM   Modules accepted: Orders

## 2017-10-03 NOTE — Telephone Encounter (Signed)
Spoke with patient, advised as seen below. Patient request to check with [facility], if 250mg  caps not available at that location send in 500mg  as [facility].   Call placed to [facility] at [phone]. Was advised 250 mg caps not in stock.    [facility] [place], spoke with Maudie Mercury, pharmacist. D/c Rx for ampicillin 250 mg, new order placed for 500 mg qid.    Call returned to patient, advised as seen above. [facility]. Patient verbalizes understanding and is agreeable.   Routing to provider for final review. Patient is agreeable to disposition. Will close encounter.

## 2017-10-03 NOTE — Telephone Encounter (Signed)
Reviewed RX with Dr. Sabra Heck for clarification. New Rx placed for ampicillin 500 mg tid #15/0RF.   Call returned to pharmacy, spoke with Maudie Mercury, pharmacist. Advised as seen above. Read back and confirmed.

## 2017-10-03 NOTE — Telephone Encounter (Signed)
Patient says the antibiotic that was prescribed this morning is not available at her pharmacy.

## 2017-10-03 NOTE — Progress Notes (Signed)
Patient here for follow up urine culture. States she  completed macrobid on 09/26/17. Patient still complaining of urgency and states "I still feel uncomfortable there." Clean catch sample sent for urine culture. Patient states she is traveling to [place] next Friday and would just like to have this taken care of before she travels. RN advised would review with Dr. Sabra Heck who would likely evaluate patient. Patient states she is available to stay for work in appointment if needed.

## 2017-10-03 NOTE — Progress Notes (Signed)
GYNECOLOGY  VISIT  CC:   Urinary urgency  HPI: 62 y.o. G56P2002 Married Caucasian female here for recheck after begin diagnosed with cystitis last week.  Urine culture showed e coli and she was treated with macrobid.  Symptoms improved but now seem to be back.  She is having urinary urgency and dysuria.  She denies back pain or fever.  She is going to [place] on a trip soon and would like these symptoms to be fully resolved before leaving.  On a separate issue, she's done some research and has a gastroenterologist that she's like to see.  Since having a hysterectomy and rectocele repair, she feels like there is "something" very low in the colon that prevents stool from passing, like a scar.  There is no visible or palpable area like that on physical exam.  I've wondered if there is an issue with her sphincter muscle when the rectal vault starts to fill up.  She has not wanted to have any more tests or PT (which she did for a short while and felt it didn't help) until now.  Also, she does not want to travel any significant distance to see a provider and this person is close to her home.  Asks if I would make a referral for her--which of course I will.  GYNECOLOGIC HISTORY: Patient's last menstrual period was 05/13/2005. Contraception: PMP Menopausal hormone therapy: none  Patient Active Problem List   Diagnosis Date Noted  . 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 Urinary frequency 07/07/2015  . Rectal pressure 07/07/2015  . Arthralgia of hip 03/22/2014  . Arthralgia, sacroiliac 03/22/2014  . ANA positive 03/10/2014  . 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 Uterovaginal prolapse 09/17/2012    Past Medical History:  Diagnosis Date  . Abnormal Pap smear of cervix 12/2009   ASCUS cannot exclude HGSIL.  [name] Arthralgia of right hip 2015   Hip replacement   . Cystocele or rectocele with uterine prolapse   . IBS (irritable bowel syndrome)   . Osteoarthritis   . Rectocele   . Tourette's  disorder 2015   Tourette's disorder with a tic that is triggered by eating. From [facility]     Past Surgical History:  Procedure Laterality Date  . COLPOSCOPY  12/2009   negative  . hardward removal from right foot  12/15   Dr. Wardell Honour  . JOINT REPLACEMENT Right 2001   Hip replacement   . Posterior vaginal repair  10/21/2012   Procedure: VAGINAL REPAIR POSTERIOR; Surgeon: Daneil Dolin, MD; Location: [facility] MAIN OR; Service: Gynecology;;  . UTERINE SUSPENSION  10/21/12   Procedure: UTEROSACRAL SUSPENSION; Surgeon: Daneil Dolin, MD; Location[facility] MAIN OR; Service: Gynecology;;  . VAGINAL HYSTERECTOMY  10/21/12   Procedure: HYSTERECTOMY VAGINAL; Surgeon: Daneil Dolin, MD; Location: [place]; Service: Gynecology; Laterality: N/A; bilateral salpingectomy    MEDS:   Current Outpatient Medications on File Prior to Visit  Medication Sig Dispense Refill  . aspirin-acetaminophen-caffeine (EXCEDRIN MIGRAINE) 250-250-65 MG tablet Take by mouth.    . Estradiol (VAGIFEM) 10 MCG TABS vaginal tablet Place 1 tablet (10 mcg total) vaginally 2 (two) times a week. 24 tablet 3  . lidocaine (XYLOCAINE) 5 % ointment Apply small amt to area no more than twice daily 1.25 g 1  . meloxicam (MOBIC) 7.5 MG tablet TAKE 1 TABLET BY MOUTH EVERY DAY 30 tablet 0  . omeprazole (PRILOSEC) 20 MG capsule Take 1 capsule (20 mg total) by mouth daily. 30 capsule  1  . polyethylene glycol powder (MIRALAX) powder Take 17 g by mouth.    . solifenacin (VESICARE) 5 MG tablet Take 5 mg by mouth daily.     No current facility-administered medications on file prior to visit.     ALLERGIES: Cephalexin; Ciprofloxacin; and Sulfa antibiotics  Family History  Problem Relation Age of Onset  . Cancer Father     SH:  Married, non smoker  Review of Systems  Constitutional: Negative.   Genitourinary: Positive for dysuria, frequency and urgency. Negative for flank pain and hematuria.    PHYSICAL  EXAMINATION:    BP 130/80 (BP Location: Right Arm, Patient Position: Sitting, Cuff Size: Normal)   Pulse 80   Resp 14   Ht 5' 10.5" (1.791 m)   Wt 188 lb 1.6 oz (85.3 kg)   LMP 05/13/2005   BMI 26.61 kg/m     General appearance: alert, cooperative and appears stated age Flank:  No CVA tenderness Abdomen: soft, non-tender; bowel sounds normal; no masses,  no organomegaly  Pelvic: External genitalia:  no lesions              Urethra:  normal appearing urethra with no masses, tenderness or lesions              Bartholins and Skenes: normal                 Vagina: normal appearing vagina with normal color and discharge, no lesions              Cervix: no lesions              Bimanual Exam:  Uterus:  normal size, contour, position, consistency, mobility, non-tender              Adnexa: no mass, fullness, tenderness              Anus:  no lesions  Chaperone was present for exam.  Assessment: Recent E coli cystitis with continue symptoms Issues since surgery with bowel movements, abnormal defecation  Plan: Urine culture will be repeated Will change antibiotic to ampicillin 250mg  QID x 5 days Rx for diflucan 150mg  po x 1, repeat 72 hours if needed.  #2/0RF. Referral to GI placed   ~20 minutes spent with patient >50% of time was in face to face discussion of above.

## 2017-10-03 NOTE — Telephone Encounter (Signed)
Call returned to patient. [facility], recommended 500 mg bid or alternative RX if available. Advised will review with Dr. Wallis Mart and return call.    Reviewed with Dr. Sabra Heck, Ampicillin 500 mg q8hrs recommended Tx for UTI. Can check with other pharmacies for 250 mg caps.

## 2017-10-04 LAB — URINE CULTURE: Organism ID, Bacteria: NO GROWTH

## 2017-10-17 NOTE — Progress Notes (Signed)
Office Visit Note  Patient: Lauren Osborne             Date of Birth: 11-30-1955           MRN: [id number]             PCP: [name], MD Referring: [name], MD Visit Date: 10/29/2017 Occupation: Faculty at [employer]    Subjective:  Pain in joints and muscles.   History of Present Illness: Lauren Osborne is a 62 y.o. female seen in consultation per request of her PCP.  According to patient she has had history of fatigue for many years.  For the last few years she has been experiencing discomfort in her bilateral wrist joints, bilateral knee joints and bilateral ankles.  She states she has difficulty opening jars due to wrist joint discomfort.  She also has difficulty getting up from the chair due to knee joint discomfort.  She has not noticed any joint swelling.  Her ankle joints feel puffy at times.  She has been experiencing difficulty walking for the last 1 year.  She describes heaviness in her muscles of lower extremities.  She states they are painful and achy and sometimes cramp.  She is unable to exercise for ongoing symptoms.  She also complains of some dizziness especially in the morning.  She states she might have migraines and she takes migraines Excedrin which helps her symptoms.  She is also had oral ulcers and nasal ulcers for last few years.  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  She also had rheumatology evaluation at [facility] in 2015 which was negative.  Activities of Daily Living:  Patient reports morning stiffness for 2 minutes.   Patient Denies nocturnal pain.  Difficulty dressing/grooming: Denies Difficulty climbing stairs: Reports Difficulty getting out of chair: Reports Difficulty using hands for taps, buttons, cutlery, and/or writing: Reports   Review of Systems  Constitutional: Positive for fatigue. Negative for night sweats, weight gain and weight loss.  HENT: Positive for mouth sores.  Negative for trouble swallowing, trouble swallowing, mouth dryness and nose dryness.        Nasal ulcers  Eyes: Negative for pain, redness, visual disturbance and dryness.  Respiratory: Negative for cough, shortness of breath and difficulty breathing.   Cardiovascular: Negative for chest pain, palpitations, hypertension, irregular heartbeat and swelling in legs/feet.  Gastrointestinal: Negative for blood in stool, constipation and diarrhea.  Endocrine: Negative for increased urination.  Genitourinary: Negative for vaginal dryness.  Musculoskeletal: Positive for arthralgias, joint pain, myalgias, morning stiffness and myalgias. Negative for joint swelling, muscle weakness and muscle tenderness.  Skin: Negative for color change, rash, hair loss, skin tightness, ulcers and sensitivity to sunlight.  Allergic/Immunologic: Negative for susceptible to infections.  Neurological: Positive for dizziness. Negative for memory loss, night sweats and weakness.  Hematological: Negative for swollen glands.  Psychiatric/Behavioral: Negative for depressed mood and sleep disturbance. The patient is not nervous/anxious.     PMFS History:  Patient Active Problem List   Diagnosis Date Noted  . 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 Urinary frequency 07/07/2015  . Rectal pressure 07/07/2015  . Arthralgia of hip 03/22/2014  . Arthralgia, sacroiliac 03/22/2014  . ANA positive 03/10/2014  . 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 Uterovaginal prolapse 09/17/2012    Past Medical History:  Diagnosis Date  . Abnormal Pap smear of cervix 12/2009   ASCUS cannot exclude HGSIL.  [name]  Arthralgia of right hip 2015   Hip replacement   . Cystocele or rectocele with uterine prolapse   . IBS (irritable bowel syndrome)   . Osteoarthritis   . Rectocele   . 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 From [facility]     Family History  Problem Relation Age of Onset  . Cancer Father    . Osteoarthritis Sister   . Osteoarthritis Brother   . Crohn's disease Son    Past Surgical History:  Procedure Laterality Date  . BUNIONECTOMY Right   . COLPOSCOPY  12/2009   negative  . FOOT SURGERY Right    bone removed   . hardward removal from right foot  12/15   Dr. Wardell Honour  . JOINT REPLACEMENT Right 2001   Hip replacement   . Posterior vaginal repair  10/21/2012   Procedure: VAGINAL REPAIR POSTERIOR; Surgeon: Daneil Dolin, MD; Location: [facility] MAIN OR; Service: Gynecology;;  . UTERINE SUSPENSION  10/21/12   Procedure: UTEROSACRAL SUSPENSION; Surgeon: Daneil Dolin, MD; Location: [facility] MAIN OR; Service: Gynecology;;  . VAGINAL HYSTERECTOMY  10/21/12   Procedure: HYSTERECTOMY VAGINAL; Surgeon: Daneil Dolin, MD; Location: [place]; Service: Gynecology; Laterality: N/A; bilateral salpingectomy   Social History   Social History Narrative  . Not on file     Objective: Vital Signs: BP 124/76 (BP Location: Left Arm, Patient Position: Sitting, Cuff Size: Normal)   Pulse 73   Resp 14   Ht 5' 10.5" (1.791 m)   Wt 185 lb (83.9 kg)   LMP 05/13/2005   BMI 26.17 kg/m    Physical Exam  Constitutional: She is oriented to person, place, and time. She appears well-developed and well-nourished.  HENT:  Head: Normocephalic and atraumatic.  Eyes: Conjunctivae and EOM are normal.  Neck: Normal range of motion.  Cardiovascular: Normal rate, regular rhythm, normal heart sounds and intact distal pulses.  Pulmonary/Chest: Effort normal and breath sounds normal.  Abdominal: Soft. Bowel sounds are normal.  Lymphadenopathy:    She has no cervical adenopathy.  Neurological: She is alert and oriented to person, place, and time.  Skin: Skin is warm and dry. Capillary refill takes less than 2 seconds.  Psychiatric: She has a normal mood and affect. Her behavior is normal.  Nursing note and vitals reviewed.    Musculoskeletal Exam: C-spine thoracic  lumbar spine good range of motion.  Shoulder joints elbow joints wrist joint MCPs PIPs DIPs were in good range of motion with no synovitis.  Hip joints knee joints ankles are in good range of motion.  She is arthritic changes in her PIP and MTP joints and postoperative changes.  He has hypermobility in her joints.  CDAI Exam: No CDAI exam completed.    Investigation: Findings:  12/15/15: ANA 1:80 Homogeneous, CCP <16, uric acid 3.7, CK 70, sed rate 1, RF <10, TSH 1.24 08/28/17: Sed rate 2   Component     Latest Ref Rng & Units 12/15/2015  ANA Pattern 1      HOMOGENEOUS (A)  ANA Titer 1     titer 1:80 (H)  Uric Acid, Serum     2.5 - 7.0 mg/dL 3.7  Sed Rate     0 - 30 mm/hr 1  RA Latex Turbid.     <=14 IU/mL <10  Anti Nuclear Antibody(ANA)     NEGATIVE POS (A)  Cyclic Citrullin Peptide Ab     Units <16  TSH  mIU/L 1.24  CK Total     7 - 177 U/L 70   CBC Latest Ref Rng & Units 08/28/2017 07/06/2015 04/06/2013  WBC 3.4 - 10.8 x10E3/uL 9.2 6.8 5.7  Hemoglobin 11.1 - 15.9 g/dL 15.3 14.4 14.8  Hematocrit 34.0 - 46.6 % 45.3 43.3 45.9  Platelets 150 - 379 x10E3/uL 325 327 -   CMP Latest Ref Rng & Units 07/06/2015  Glucose 65 - 99 mg/dL 90  BUN 7 - 25 mg/dL 17  Creatinine 0.50 - 1.05 mg/dL 0.85  Sodium 135 - 146 mmol/L 139  Potassium 3.5 - 5.3 mmol/L 3.9  Chloride 98 - 110 mmol/L 103  CO2 20 - 31 mmol/L 30  Calcium 8.6 - 10.4 mg/dL 9.6  Total Protein 6.1 - 8.1 g/dL 6.9  Total Bilirubin 0.2 - 1.2 mg/dL 0.5  Alkaline Phos 33 - 130 U/L 72  AST 10 - 35 U/L 15  ALT 6 - 29 U/L 15     Imaging: No results found.  Speciality Comments: No specialty comments available.    Procedures:  No procedures performed Allergies: Cephalexin; Ciprofloxacin; and Sulfa antibiotics   Assessment / Plan:     Visit Diagnoses: Positive ANA (antinuclear antibody) - 12/15/15: ANA 1:80 Homogeneous.  Patient is concerned about autoimmune disease.  She gives history of recurrent oral and nasal ulcers.   She states 1 of the biopsy of the nasal also was positive for herpes virus in the past.  She also has autoimmune work-up in 2015 at [facility] which was negative.  I will send her for AVISE labs for a thorough work-up.  Polyarthralgia-I believe her arthralgias are related to hypermobility and possibly myofascial pain syndrome.  She had no synovitis on examination.  She does have osteoarthritis in her feet and that causes some discomfort as well.  She complains of knee joint pain and ankle pain.  I offered x-rays which she declined.  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  Myalgia-she complains of generalized myalgias and hyperalgesia.  I believe she has a component of fibromyalgia.  Gilles de la Tourette's syndrome  Pudendal neuralgia  History of IBS  Chronic nonintractable headache, unspecified headache type-patient believes that she has migraines.  She is also experiencing dizziness and lower extremities paresthesias.  I will refer her to neurology for evaluation.  She is also concerned about lower extremity cramps on increased activity and possible vascular claudication.  I will refer her to [facility] for evaluation.   Orders: No orders of the defined types were placed in this encounter.  No orders of the defined types were placed in this encounter.   Face-to-face time spent with patient was 50 minutes. >50% of time was spent in counseling and coordination of care.  Follow-Up Instructions: No follow-ups on file.   Bo Merino, MD  Note - This record has been created using Editor, commissioning.  Chart creation errors have been sought, but may not always  have been located. Such creation errors do not reflect on  the standard of medical care.

## 2017-10-27 ENCOUNTER — Other Ambulatory Visit: Payer: Self-pay | Admitting: Family Medicine

## 2017-10-27 DIAGNOSIS — R12 Heartburn: Secondary | ICD-10-CM

## 2017-10-29 ENCOUNTER — Ambulatory Visit (INDEPENDENT_AMBULATORY_CARE_PROVIDER_SITE_OTHER): Payer: BC Managed Care – PPO | Admitting: Rheumatology

## 2017-10-29 ENCOUNTER — Encounter: Payer: Self-pay | Admitting: Rheumatology

## 2017-10-29 ENCOUNTER — Other Ambulatory Visit: Payer: Self-pay | Admitting: *Deleted

## 2017-10-29 VITALS — BP 124/76 | HR 73 | Resp 14 | Ht 70.5 in | Wt 185.0 lb

## 2017-10-29 DIAGNOSIS — R51 Headache: Secondary | ICD-10-CM | POA: Diagnosis not present

## 2017-10-29 DIAGNOSIS — M2550 Pain in unspecified joint: Secondary | ICD-10-CM

## 2017-10-29 DIAGNOSIS — Z8719 Personal history of other diseases of the digestive system: Secondary | ICD-10-CM | POA: Diagnosis not present

## 2017-10-29 DIAGNOSIS — R42 Dizziness and giddiness: Secondary | ICD-10-CM | POA: Diagnosis not present

## 2017-10-29 DIAGNOSIS — R768 Other specified abnormal immunological findings in serum: Secondary | ICD-10-CM

## 2017-10-29 DIAGNOSIS — M249 Joint derangement, unspecified: Secondary | ICD-10-CM

## 2017-10-29 DIAGNOSIS — M791 Myalgia, unspecified site: Secondary | ICD-10-CM

## 2017-10-29 DIAGNOSIS — R202 Paresthesia of skin: Secondary | ICD-10-CM | POA: Diagnosis not present

## 2017-10-29 DIAGNOSIS — R519 Headache, unspecified: Secondary | ICD-10-CM

## 2017-10-29 DIAGNOSIS — F952 Tourette's disorder: Secondary | ICD-10-CM | POA: Diagnosis not present

## 2017-10-29 DIAGNOSIS — G588 Other specified mononeuropathies: Secondary | ICD-10-CM

## 2017-10-29 DIAGNOSIS — R252 Cramp and spasm: Secondary | ICD-10-CM

## 2017-10-29 DIAGNOSIS — G8929 Other chronic pain: Secondary | ICD-10-CM

## 2017-10-31 DIAGNOSIS — H903 Sensorineural hearing loss, bilateral: Secondary | ICD-10-CM | POA: Diagnosis not present

## 2017-11-11 NOTE — Progress Notes (Signed)
Office Visit Note  Patient: Lauren Osborne             Date of Birth: 1956/02/02           MRN: [id number]             PCP: [name], MD Referring: [name], MD Visit Date: 11/20/2017 Occupation: @GUAROCC @    Subjective:  Pain in multiple joints and muscles.  History of Present Illness: Lauren Osborne is a 62 y.o. female 3 of positive ANA myalgias and osteoarthritis.  She states she continues to have some joint pain and muscle pain.  She denies any joint swelling.  Her main concern today is fatigue.  She states she feels tired all the time.  She has difficulty doing routine activities.  She feels dizzy at times.  She still feels numbness in her lower extremities and cramps in her lower extremities when she walks.  Activities of Daily Living:  Patient reports morning stiffness for 20 minutes.   Patient Denies nocturnal pain.  Difficulty dressing/grooming: Denies Difficulty climbing stairs: Reports Difficulty getting out of chair: Reports Difficulty using hands for taps, buttons, cutlery, and/or writing: Reports   Review of Systems  Constitutional: Positive for fatigue. Negative for night sweats, weight gain and weight loss.  HENT: Positive for mouth dryness. Negative for mouth sores, trouble swallowing, trouble swallowing and nose dryness.   Eyes: Negative for pain, redness, visual disturbance and dryness.  Respiratory: Negative for cough, hemoptysis, shortness of breath and difficulty breathing.   Cardiovascular: Negative for chest pain, palpitations, hypertension, irregular heartbeat and swelling in legs/feet.  Gastrointestinal: Negative for abdominal pain, blood in stool, constipation and diarrhea.  Endocrine: Negative for increased urination.  Genitourinary: Negative for painful urination and vaginal dryness.  Musculoskeletal: Positive for morning stiffness. Negative for arthralgias, joint pain, joint swelling, myalgias, muscle weakness, muscle tenderness  and myalgias.  Skin: Negative for color change, pallor, rash, hair loss, nodules/bumps, skin tightness, ulcers and sensitivity to sunlight.  Allergic/Immunologic: Negative for susceptible to infections.  Neurological: Positive for headaches. Negative for dizziness, light-headedness, numbness, memory loss, night sweats and weakness.  Hematological: Negative for swollen glands.  Psychiatric/Behavioral: Negative for depressed mood and sleep disturbance. The patient is not nervous/anxious.     PMFS History:  Patient Active Problem List   Diagnosis Date Noted  . 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 Urinary frequency 07/07/2015  . Rectal pressure 07/07/2015  . Arthralgia of hip 03/22/2014  . Arthralgia, sacroiliac 03/22/2014  . ANA positive 03/10/2014  . 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 Uterovaginal prolapse 09/17/2012    Past Medical History:  Diagnosis Date  . Abnormal Pap smear of cervix 12/2009   ASCUS cannot exclude HGSIL.  [name] Arthralgia of right hip 2015   Hip replacement   . Cystocele or rectocele with uterine prolapse   . IBS (irritable bowel syndrome)   . Osteoarthritis   . Rectocele   . 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 From [facility]     Family History  Problem Relation Age of Onset  . Cancer Father   . Osteoarthritis Sister   . Osteoarthritis Brother   . Crohn's disease Son    Past Surgical History:  Procedure Laterality Date  . BUNIONECTOMY Right   . COLPOSCOPY  12/2009   negative  . FOOT SURGERY Right    bone removed   . hardward removal from right foot  12/15  Dr. Wardell Honour  . JOINT REPLACEMENT Right 2001   Hip replacement   . Posterior vaginal repair  10/21/2012   Procedure: VAGINAL REPAIR POSTERIOR; Surgeon: Daneil Dolin, MD; Location: [facility] MAIN OR; Service: Gynecology;;  . UTERINE SUSPENSION  10/21/12   Procedure: UTEROSACRAL SUSPENSION; Surgeon: Daneil Dolin, MD;  Location[facility] MA[facility]; Service: Gynecology;;  . VAGINAL HYSTERECTOMY  10/21/12   Procedure: HYSTERECTOMY VAGINAL; Surgeon: Daneil Dolin, MD; Location: [place]; Service: Gynecology; Laterality: N/A; bilateral salpingectomy   Social History   Social History Narrative  . Not on file     Objective: Vital Signs: BP 112/68 (BP Location: Left Arm, Patient Position: Sitting, Cuff Size: Normal)   Pulse 85   Resp 15   Ht 5' 10.5" (1.791 m)   Wt 184 lb (83.5 kg)   LMP 05/13/2005   BMI 26.03 kg/m    Physical Exam  Constitutional: She is oriented to person, place, and time. She appears well-developed and well-nourished.  HENT:  Head: Normocephalic and atraumatic.  Eyes: Conjunctivae and EOM are normal.  Neck: Normal range of motion.  Cardiovascular: Normal rate, regular rhythm, normal heart sounds and intact distal pulses.  Pulmonary/Chest: Effort normal and breath sounds normal.  Abdominal: Soft. Bowel sounds are normal.  Lymphadenopathy:    She has no cervical adenopathy.  Neurological: She is alert and oriented to person, place, and time.  Skin: Skin is warm and dry. Capillary refill takes less than 2 seconds.  Psychiatric: She has a normal mood and affect. Her behavior is normal.  Nursing note and vitals reviewed.    Musculoskeletal Exam: C-spine thoracic lumbar spine good range of motion.  Shoulder joints elbow joints wrist joint MCPs PIPs DIPs were in good range of motion with no synovitis.  She has some osteoarthritic changes in her feet with PIP/DIP thickening.  Hip joints knee joints ankles were in good range of motion.  She has some hypermobility in her joints.  CDAI Exam: No CDAI exam completed.    Investigation: No additional findings. CBC Latest Ref Rng & Units 08/28/2017 07/06/2015 04/06/2013  WBC 3.4 - 10.8 x10E3/uL 9.2 6.8 5.7  Hemoglobin 11.1 - 15.9 g/dL 15.3 14.4 14.8  Hematocrit 34.0 - 46.6 % 45.3 43.3 45.9  Platelets 150 - 379 x10E3/uL 325 327 -     CMP Latest Ref Rng & Units 07/06/2015  Glucose 65 - 99 mg/dL 90  BUN 7 - 25 mg/dL 17  Creatinine 0.50 - 1.05 mg/dL 0.85  Sodium 135 - 146 mmol/L 139  Potassium 3.5 - 5.3 mmol/L 3.9  Chloride 98 - 110 mmol/L 103  CO2 20 - 31 mmol/L 30  Calcium 8.6 - 10.4 mg/dL 9.6  Total Protein 6.1 - 8.1 g/dL 6.9  Total Bilirubin 0.2 - 1.2 mg/dL 0.5  Alkaline Phos 33 - 130 U/L 72  AST 10 - 35 U/L 15  ALT 6 - 29 U/L 15     Imaging: No results found.  Speciality Comments: No specialty comments available.    Procedures:  No procedures performed Allergies: Cephalexin; Ciprofloxacin; and Sulfa antibiotics   Assessment / Plan:     Visit Diagnoses: Positive ANA (antinuclear antibody) - ANA 1:80 Homogeneous, hx of recurrent oral and nasal ulcers, work up by [facility] in 2015 negative AVISE labs showed index of -1.0.  She does not meet criteria for lupus.  We had detailed discussion regarding this.  She will notify me if she develops any new symptoms in future.  Fatigue-is a  major concern.  She states she gets tired very easily.  She also gets short of breath with exertion.  I have advised her to contact her PCP and may be need evaluation by cardiologist.  Primary osteoarthritis of both feet-proper fitting shoes were discussed.  Hypermobility arthralgia-she will benefit from isometric exercises.  Myalgia - component of possible Fibromyalgia .  I will refer her to integrative therapies.  Gilles de la Tourette disorder  Chronic nonintractable headache, unspecified headache type - referred to neurology.  Patient has appointment next month.  Paresthesias - Lower extermity cramps with increased activity and possible vascular claudications, we have a scheduled studies at the [facility] vascular center.  Gilles de la Tourette's syndrome  Pudendal neuralgia  History of IBS    Orders: No orders of the defined types were placed in this encounter.  No orders of the defined types were placed in this  encounter.   Face-to-face time spent with patient was 30 minutes. Greater than 50% of time was spent in counseling and coordination of care.  Follow-Up Instructions: Return if symptoms worsen or fail to improve.   Bo Merino, MD  Note - This record has been created using Editor, commissioning.  Chart creation errors have been sought, but may not always  have been located. Such creation errors do not reflect on  the standard of medical care.

## 2017-11-17 ENCOUNTER — Other Ambulatory Visit: Payer: Self-pay

## 2017-11-17 ENCOUNTER — Ambulatory Visit (INDEPENDENT_AMBULATORY_CARE_PROVIDER_SITE_OTHER): Payer: BC Managed Care – PPO | Admitting: Obstetrics & Gynecology

## 2017-11-17 ENCOUNTER — Encounter: Payer: Self-pay | Admitting: Obstetrics & Gynecology

## 2017-11-17 ENCOUNTER — Telehealth: Payer: Self-pay | Admitting: Obstetrics & Gynecology

## 2017-11-17 VITALS — BP 110/70 | HR 80 | Temp 97.4°F | Resp 16 | Ht 70.5 in | Wt 182.0 lb

## 2017-11-17 DIAGNOSIS — R300 Dysuria: Secondary | ICD-10-CM | POA: Diagnosis not present

## 2017-11-17 LAB — POCT URINALYSIS DIPSTICK
Bilirubin, UA: NEGATIVE
Glucose, UA: NEGATIVE
Ketones, UA: NEGATIVE
Nitrite, UA: POSITIVE
Protein, UA: NEGATIVE
Urobilinogen, UA: 0.2 E.U./dL
pH, UA: 5 (ref 5.0–8.0)

## 2017-11-17 MED ORDER — AMPICILLIN 500 MG PO CAPS
500.0000 mg | ORAL_CAPSULE | Freq: Three times a day (TID) | ORAL | 0 refills | Status: DC
Start: 2017-11-17 — End: 2017-12-19

## 2017-11-17 MED ORDER — SOLIFENACIN SUCCINATE 5 MG PO TABS: 5.0000 mg | ORAL_TABLET | ORAL | 4 refills | Status: DC | PRN

## 2017-11-17 NOTE — Progress Notes (Signed)
GYNECOLOGY  VISIT  CC:   Possible UTI  HPI: 62 y.o. G73P2002 Married Caucasian female here for dysuria and urgency x 2 weeks.  This is the fourth time that she's has urinary tract like symptoms since April, 2018.  She was seen at urgent care as well as in my office for this.  Denies hematuria.  Urine does look darker and has some odor. No back pain, pelvic pain or fever.  Does have GI appt next Friday.  Also seeing neurology in August.    GYNECOLOGIC HISTORY: Patient's last menstrual period was 05/13/2005. Contraception: post menopausal  Menopausal hormone therapy: none  Patient Active Problem List   Diagnosis Date Noted  . 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 Urinary frequency 07/07/2015  . Rectal pressure 07/07/2015  . Arthralgia of hip 03/22/2014  . Arthralgia, sacroiliac 03/22/2014  . ANA positive 03/10/2014  . 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 Uterovaginal prolapse 09/17/2012    Past Medical History:  Diagnosis Date  . Abnormal Pap smear of cervix 12/2009   ASCUS cannot exclude HGSIL.  [name] Arthralgia of right hip 2015   Hip replacement   . Cystocele or rectocele with uterine prolapse   . IBS (irritable bowel syndrome)   . Osteoarthritis   . Rectocele   . 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 From [facility]     Past Surgical History:  Procedure Laterality Date  . BUNIONECTOMY Right   . COLPOSCOPY  12/2009   negative  . FOOT SURGERY Right    bone removed   . hardward removal from right foot  12/15   Dr. Wardell Honour  . JOINT REPLACEMENT Right 2001   Hip replacement   . Posterior vaginal repair  10/21/2012   Procedure: VAGINAL REPAIR POSTERIOR; Surgeon: Daneil Dolin, MD; Location: [facility] MAIN OR; Service: Gynecology;;  . UTERINE SUSPENSION  10/21/12   Procedure: UTEROSACRAL SUSPENSION; Surgeon: Daneil Dolin, MD; Location: [facility] MAIN OR; Service: Gynecology;;  . VAGINAL HYSTERECTOMY  10/21/12   Procedure: HYSTERECTOMY VAGINAL; Surgeon: Daneil Dolin, MD; Location[place]; Service: Gynecology; Laterality: N/A; bilateral salpingectomy    MEDS:   Current Outpatient Medications on File Prior to Visit  Medication Sig Dispense Refill  . aspirin-acetaminophen-caffeine (EXCEDRIN MIGRAINE) 250-250-65 MG tablet Take by mouth as needed.     [name] ibuprofen (ADVIL,MOTRIN) 200 MG tablet Take 200 mg by mouth every 6 (six) hours as needed.    . lidocaine (XYLOCAINE) 5 % ointment Apply small amt to area no more than twice daily 1.25 g 1  . omeprazole (PRILOSEC) 20 MG capsule TAKE 1 CAPSULE BY MOUTH EVERY DAY (Patient taking differently: TAKE 1 CAPSULE BY MOUTH EVERY DAY, as needed) 30 capsule 1  . solifenacin (VESICARE) 5 MG tablet Take 5 mg by mouth as needed.     . Estradiol (VAGIFEM) 10 MCG TABS vaginal tablet Place 1 tablet (10 mcg total) vaginally 2 (two) times a week. (Patient not taking: Reported on 11/17/2017) 24 tablet 3   No current facility-administered medications on file prior to visit.     ALLERGIES: Cephalexin; Ciprofloxacin; and Sulfa antibiotics  Family History  Problem Relation Age of Onset  . Cancer Father   . Osteoarthritis Sister   . Osteoarthritis Brother   . Crohn's disease Son     SH:  Married, non smoker  Review of Systems  Constitutional: Positive for chills.  Genitourinary: Positive for dysuria and urgency.  Vulvar itching   All other systems reviewed and are negative.   PHYSICAL EXAMINATION:    BP 110/70 (BP Location: Right Arm, Patient Position: Sitting, Cuff Size: Large)   Pulse 80   Temp (!) 97.4 F (36.3 C) (Oral)   Resp 16   Ht 5' 10.5" (1.791 m)   Wt 182 lb (82.6 kg)   LMP 05/13/2005   BMI 25.75 kg/m     General appearance: alert, cooperative and appears stated age Flank:  No CVA tenderness Abdomen: soft, non-tender; bowel sounds normal; no masses,  no organomegaly  Pelvic: External genitalia:  no lesions               Urethra:  normal appearing urethra with no masses, tenderness or lesions              Bartholins and Skenes: normal                 Vagina: normal appearing vagina with normal color and discharge, no lesions              Cervix: absent              Bimanual Exam:  Uterus:  uterus absent              Adnexa: no mass, fullness, tenderness   Chaperone was present for exam.  Assessment: Recurrent UTI  Plan: Urine culture and micro pending. Needed vesicare 5mg  daily refilled.  Rx to pharmacy. Ampicillin 500mg  tid x 5 days to pharmacy. (started this specific antibiotic based on prior urine cultures)

## 2017-11-17 NOTE — Telephone Encounter (Signed)
Patient is asking to come in to give a urine sample. Patient was treated recently for a UTI and is now having symptoms again. To triage to assist with scheduling with Dr.Miller.

## 2017-11-17 NOTE — Telephone Encounter (Signed)
Call to patient. Reports return of pain with urination, chills and back cramping for one week. Took Azo last Thursday. Denies fever. Advised needs office visit for evaluation.  Appointment scheduled for 1015 with Dr Sabra Heck.

## 2017-11-18 LAB — URINALYSIS, MICROSCOPIC ONLY
Casts: NONE SEEN /lpf
WBC, UA: >30 /hpf — AB (ref 0–5)

## 2017-11-20 ENCOUNTER — Encounter: Payer: Self-pay | Admitting: Rheumatology

## 2017-11-20 ENCOUNTER — Other Ambulatory Visit: Payer: Self-pay | Admitting: *Deleted

## 2017-11-20 ENCOUNTER — Other Ambulatory Visit: Payer: Self-pay | Admitting: Obstetrics & Gynecology

## 2017-11-20 ENCOUNTER — Ambulatory Visit (INDEPENDENT_AMBULATORY_CARE_PROVIDER_SITE_OTHER): Payer: BC Managed Care – PPO | Admitting: Rheumatology

## 2017-11-20 VITALS — BP 112/68 | HR 85 | Resp 15 | Ht 70.5 in | Wt 184.0 lb

## 2017-11-20 DIAGNOSIS — G8929 Other chronic pain: Secondary | ICD-10-CM

## 2017-11-20 DIAGNOSIS — R5383 Other fatigue: Secondary | ICD-10-CM

## 2017-11-20 DIAGNOSIS — R768 Other specified abnormal immunological findings in serum: Secondary | ICD-10-CM | POA: Diagnosis not present

## 2017-11-20 DIAGNOSIS — M19071 Primary osteoarthritis, right ankle and foot: Secondary | ICD-10-CM

## 2017-11-20 DIAGNOSIS — R252 Cramp and spasm: Secondary | ICD-10-CM

## 2017-11-20 DIAGNOSIS — G588 Other specified mononeuropathies: Secondary | ICD-10-CM

## 2017-11-20 DIAGNOSIS — R202 Paresthesia of skin: Secondary | ICD-10-CM

## 2017-11-20 DIAGNOSIS — M19072 Primary osteoarthritis, left ankle and foot: Secondary | ICD-10-CM | POA: Diagnosis not present

## 2017-11-20 DIAGNOSIS — R519 Headache, unspecified: Secondary | ICD-10-CM

## 2017-11-20 DIAGNOSIS — R51 Headache: Secondary | ICD-10-CM

## 2017-11-20 DIAGNOSIS — F952 Tourette's disorder: Secondary | ICD-10-CM | POA: Diagnosis not present

## 2017-11-20 DIAGNOSIS — M791 Myalgia, unspecified site: Secondary | ICD-10-CM | POA: Diagnosis not present

## 2017-11-20 DIAGNOSIS — Z8719 Personal history of other diseases of the digestive system: Secondary | ICD-10-CM

## 2017-11-20 DIAGNOSIS — M2550 Pain in unspecified joint: Secondary | ICD-10-CM

## 2017-11-20 LAB — URINE CULTURE

## 2017-11-20 MED ORDER — NITROFURANTOIN MONOHYD MACRO 100 MG PO CAPS
100.0000 mg | ORAL_CAPSULE | Freq: Two times a day (BID) | ORAL | 0 refills | Status: DC
Start: 2017-11-20 — End: 2017-12-19

## 2017-11-21 ENCOUNTER — Telehealth: Payer: Self-pay | Admitting: Physician Assistant

## 2017-11-21 ENCOUNTER — Ambulatory Visit (HOSPITAL_COMMUNITY)
Admission: RE | Admit: 2017-11-21 | Discharge: 2017-11-21 | Disposition: A | Discharge status: Home / Self Care | Payer: BC Managed Care – PPO | Source: Ambulatory Visit | Attending: Rheumatology | Admitting: Rheumatology

## 2017-11-21 ENCOUNTER — Telehealth: Payer: Self-pay | Admitting: Rheumatology

## 2017-11-21 DIAGNOSIS — R252 Cramp and spasm: Secondary | ICD-10-CM | POA: Insufficient documentation

## 2017-11-21 DIAGNOSIS — M79604 Pain in right leg: Secondary | ICD-10-CM | POA: Diagnosis not present

## 2017-11-21 DIAGNOSIS — M79605 Pain in left leg: Secondary | ICD-10-CM | POA: Insufficient documentation

## 2017-11-21 NOTE — Telephone Encounter (Signed)
I spoke to the patient about the results of the vascular US arterial ABI study.  No evidence of arterial disease was found in either lower extremity.

## 2017-11-21 NOTE — Progress Notes (Signed)
VASCULAR LAB PRELIMINARY  ARTERIAL  ABI completed:    RIGHT    LEFT    PRESSURE WAVEFORM  PRESSURE WAVEFORM  BRACHIAL 115 Triphasic BRACHIAL 122 Triphasic  DP 144 Biphasic DP 131 Biphasic  PT 153 Triphasic PT 143 Triphasic    RIGHT LEFT  ABI 1.25 1.17   ABIs and Doppler waveforms are within normal limits bilaterally at rest.  SLAUGHTER, [place], RVS 11/21/2017, 10:27 AM

## 2017-11-21 NOTE — Telephone Encounter (Signed)
Patient called requesting a return call to discuss her labwork that needs to be sent to [place].

## 2017-11-24 NOTE — Telephone Encounter (Signed)
Patient states she has had the AVISE labs done. Patient states she was advised it would only be $45 to have it done. Patient state her insurance did not cover it and now she has a bill for the labs.

## 2017-11-24 NOTE — Telephone Encounter (Signed)
Patient received a EOB not a bill. Patient advised that per the AVISE rep she will only receive a bill for $45.

## 2017-11-27 ENCOUNTER — Ambulatory Visit: Payer: BC Managed Care – PPO | Admitting: Rheumatology

## 2017-11-28 DIAGNOSIS — K219 Gastro-esophageal reflux disease without esophagitis: Secondary | ICD-10-CM | POA: Insufficient documentation

## 2017-11-28 DIAGNOSIS — K6289 Other specified diseases of anus and rectum: Secondary | ICD-10-CM | POA: Diagnosis not present

## 2017-11-28 DIAGNOSIS — R1310 Dysphagia, unspecified: Secondary | ICD-10-CM | POA: Diagnosis not present

## 2017-12-08 DIAGNOSIS — K3189 Other diseases of stomach and duodenum: Secondary | ICD-10-CM | POA: Diagnosis not present

## 2017-12-08 DIAGNOSIS — R1310 Dysphagia, unspecified: Secondary | ICD-10-CM | POA: Diagnosis not present

## 2017-12-18 DIAGNOSIS — Z9889 Other specified postprocedural states: Secondary | ICD-10-CM | POA: Diagnosis not present

## 2017-12-18 DIAGNOSIS — H4423 Degenerative myopia, bilateral: Secondary | ICD-10-CM | POA: Diagnosis not present

## 2017-12-18 DIAGNOSIS — Z961 Presence of intraocular lens: Secondary | ICD-10-CM | POA: Diagnosis not present

## 2017-12-19 ENCOUNTER — Encounter: Payer: Self-pay | Admitting: Neurology

## 2017-12-19 ENCOUNTER — Ambulatory Visit (INDEPENDENT_AMBULATORY_CARE_PROVIDER_SITE_OTHER): Payer: BC Managed Care – PPO | Admitting: Neurology

## 2017-12-19 VITALS — BP 108/73 | HR 73 | Ht 71.0 in | Wt 179.0 lb

## 2017-12-19 DIAGNOSIS — R29898 Other symptoms and signs involving the musculoskeletal system: Secondary | ICD-10-CM

## 2017-12-19 DIAGNOSIS — G834 Cauda equina syndrome: Secondary | ICD-10-CM | POA: Diagnosis not present

## 2017-12-19 DIAGNOSIS — R2689 Other abnormalities of gait and mobility: Secondary | ICD-10-CM

## 2017-12-19 DIAGNOSIS — M48062 Spinal stenosis, lumbar region with neurogenic claudication: Secondary | ICD-10-CM | POA: Diagnosis not present

## 2017-12-19 DIAGNOSIS — R339 Retention of urine, unspecified: Secondary | ICD-10-CM

## 2017-12-19 DIAGNOSIS — R150 Incomplete defecation: Secondary | ICD-10-CM

## 2017-12-19 NOTE — Progress Notes (Signed)
[id number]    Provider:  Dr Jaynee Eagles Referring Provider: [name], MD Primary Care Physician:  [name], MD  CC:  "my legs are not functioning correctly"  HPI:  Lauren Osborne is a 62 y.o. female here as a referral from Dr. Carlota Raspberry for leg weakness.  Her legs feel heavy, she cant stand or walk for too long. She feel pressure, cramping in her feet. Worsening in the last year, years ago, slowly progressive. She has back pain, she feels like there is something in her low back and if she needs to have a bowel movement she has difficulty, she has to lean over the kitchen counter. Holding onto the cart helps. She needs a walking stick and her balance is lost. No falls, but she tripping, needs a walking stick. Weakness below the knees, difficulty climbing steps, she fatigues. She has been to rheumatology multiple times. Family history of autoimmune disorders, RA and JRA in mother and sister. She feels there is a general decreased sensation, mostly in the calfs. Just standing makes it worse, elevating legs makes it better. Putting legs on the dashboard helps, laying down is not good and she wake with symptoms. Symmetric. No upper body involvement. If she walks she has to sit.   Reviewed notes, labs and imaging from outside physicians, which showed:   01/2001: MR LE:  CLINICAL DATA:  62 YEAR OLD WITH FOOT PAIN AND SWELLING. MRI  OF THE LEFT FOOT WITHOUT CONTRAST: MULTIPLANAR MULTISEQUENCE IMAGING WAS PERFORMED.  STUDY IS CORRELATED WITH PLAIN FILMS FROM [facility] DATED 01/15/01. PLAIN FILMS DEMONSTRATE NORMAL MINERALIZATION.  JOINT SPACES ARE MAINTAINED.  THERE IS SOME SLIGHT IRREGULARITY AT THE DISTAL LATERAL ASPECT OF THE CUBOID NEAR THE CUBOID FIFTH METATARSAL ARTICULATION.  IT IS POSSIBLE THIS COULD BE AN EROSION OR A SMALL OSTEOCHONDRAL LESION. EXAMINATION OF THE MR IMAGES DEMONSTRATES EDEMA LIKE CHANGES AT THE TARSAL METATARSAL JOINT.  THERE IS MILD NONSPECIFIC  EDEMA LIKE CHANGES AROUND THIS AREA.  I DON'T SEE ANY DEFINITE DISCRETE EROSIONS.  THERE IS A SMALL JOINT EFFUSION AT THE FIRST TARSAL METATARSAL JOINT.  THESE FINDINGS ARE NOT SPECIFIC.  IF THIS PATIENT IS A DIABETIC, THIS COULD CERTAINLY REPRESENT EARLY CHANGES OF A CHARCOT LIKE PROCESS.  I THINK AN INFLAMMATORY ARTHROPATHY WOULD BE A POSSIBILITY GIVEN THE SMALL JOINT EFFUSIONS.  NO DEFINITE STRESS FRACTURES ARE SEEN.  AT THE CUBOID FIFTH METATARSAL JOINT THERE ARE SIMILAR LIKE EDEMA CHANGES AND I DON'T SEE A DISCRETE BONY LESION BUT I THINK THERE IS CYSTIC CHANGE IN THE JOINT AS NOTED ON THE PLAIN FILMS. THE REMAINDER OF THE FOOT DEMONSTRATES NO SIGNIFICANT ABNORMALITIES.  THE ACHILLES TENDON AND PLANTAR FASCIA APPEAR NORMAL.  THERE IS A SMALL AMOUNT OF FLUID SURROUNDING THE FLEXOR DIGITORUM LONGUS AND FLEXOR HALLUCIS LONGUS TENDONS WHICH SUGGEST MILD TENOSYNOVITIS.  THIS CERTAINLY COULD BE SEEN WITH INFLAMMATORY ARTHROPATHY ALSO. IMPRESSION TARSAL METATARSAL JOINT DEGENERATIVE TYPE CHANGES.  AS DISCUSSED ABOVE, THIS COULD BE A CHARCOT LIKE PROCESS IF THIS PATIENT IS DIABETIC.  INFLAMMATORY ARTHROPATHY WOULD BE ANOTHER POSSIBILITY. MILD TENOSYNOVITIS INVOLVING THE FLEXOR DIGITORUM LONGUS AND FLEXOR HALLUCIS LONGUS TENDONS.  Review of Systems: Patient complains of symptoms per HPI as well as the following symptoms: cramps, aching, ringing in ears, itching. Pertinent negatives and positives per HPI. All others negative.   Social History   Socioeconomic History  . Marital status: Married    Spouse name: Not on file  . Number of children: Not on file  .  Years of education: Not on file  . Highest education level: Not on file  Occupational History  . Not on file  Social Needs  . Financial resource strain: Not on file  . Food insecurity:    Worry: Not on file    Inability: Not on file  . Transportation needs:    Medical: Not on file    Non-medical: Not on file  Tobacco Use  .  Smoking status: Never Smoker  . Smokeless tobacco: Never Used  Substance and Sexual Activity  . Alcohol use: Yes    Alcohol/week: 4.0 standard drinks    Types: 4 Glasses of wine per week    Comment: occ  . Drug use: Never  . Sexual activity: Yes    Comment: Hysterectomy   Lifestyle  . Physical activity:    Days per week: Not on file    Minutes per session: Not on file  . Stress: Not on file  Relationships  . Social connections:    Talks on phone: Not on file    Gets together: Not on file    Attends religious service: Not on file    Active member of club or organization: Not on file    Attends meetings of clubs or organizations: Not on file    Relationship status: Not on file  . Intimate partner violence:    Fear of current or ex partner: Not on file    Emotionally abused: Not on file    Physically abused: Not on file    Forced sexual activity: Not on file  Other Topics Concern  . Not on file  Social History Narrative  . Not on file    Family History  Problem Relation Age of Onset  . Rheum arthritis Mother   . Cancer Father   . Osteoarthritis Sister   . Osteoarthritis Brother   . Crohn's disease Son     Past Medical History:  Diagnosis Date  . Abnormal Pap smear of cervix 12/2009   ASCUS cannot exclude HGSIL.  [name] Arthralgia of right hip 2015   Hip replacement   . Cystocele or rectocele with uterine prolapse   . Headache   . IBS (irritable bowel syndrome)   . Osteoarthritis   . Rectocele   . 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 From [facility]     Past Surgical History:  Procedure Laterality Date  . BUNIONECTOMY Right   . COLPOSCOPY  12/2009   negative  . FOOT SURGERY Right    bone removed   . hardward removal from right foot  12/15   Dr. Wardell Honour  . JOINT REPLACEMENT Right 2001   Hip replacement   . Posterior vaginal repair  10/21/2012   Procedure: VAGINAL REPAIR POSTERIOR; Surgeon: Daneil Dolin, MD; Location: [facility] MAIN OR; Service: Gynecology;;  . UTERINE SUSPENSION  10/21/12   Procedure: UTEROSACRAL SUSPENSION; Surgeon: Daneil Dolin, MD; Location: [facility] MAIN OR; Service: Gynecology;;  . VAGINAL HYSTERECTOMY  10/21/12   Procedure: HYSTERECTOMY VAGINAL; Surgeon: Daneil Dolin, MD; Location: [place]; Service: Gynecology; Laterality: N/A; bilateral salpingectomy    Current Outpatient Medications  Medication Sig Dispense Refill  . aspirin-acetaminophen-caffeine (EXCEDRIN MIGRAINE) 250-250-65 MG tablet Take by mouth as needed.     . Estradiol (VAGIFEM) 10 MCG TABS vaginal tablet Place 1 tablet (10 mcg total) vaginally 2 (two) times a week. 24 tablet 3  . fexofenadine (ALLEGRA) 180 MG tablet Take 180 mg  by mouth daily.    [name] ibuprofen (ADVIL,MOTRIN) 200 MG tablet Take 200 mg by mouth every 6 (six) hours as needed.    . lidocaine (XYLOCAINE) 5 % ointment Apply small amt to area no more than twice daily 1.25 g 1  . omeprazole (PRILOSEC) 20 MG capsule TAKE 1 CAPSULE BY MOUTH EVERY DAY (Patient taking differently: TAKE 1 CAPSULE BY MOUTH EVERY DAY, as needed) 30 capsule 1  . solifenacin (VESICARE) 5 MG tablet Take 1 tablet (5 mg total) by mouth as needed. 90 tablet 4   No current facility-administered medications for this visit.     Allergies as of 12/19/2017 - Review Complete 12/19/2017  Allergen Reaction Noted  . Cephalexin Diarrhea 07/06/2015  . Ciprofloxacin Diarrhea 07/06/2015  . Sulfa antibiotics Other (See Comments) 04/06/2013    Vitals: BP 108/73   Pulse 73   Ht 5' 11"  (1.803 m)   Wt 179 lb (81.2 kg)   LMP 05/13/2005   BMI 24.97 kg/m  Last Weight:  Wt Readings from Last 1 Encounters:  12/19/17 179 lb (81.2 kg)   Last Height:   Ht Readings from Last 1 Encounters:  12/19/17 5' 11"  (1.803 m)   Physical exam: Exam: Gen: NAD, conversant, well nourised, well groomed                     CV: RRR, no MRG. No Carotid Bruits. No  peripheral edema, warm, nontender Eyes: Conjunctivae clear without exudates or hemorrhage  Neuro: Detailed Neurologic Exam  Speech:    Speech is normal; fluent and spontaneous with normal comprehension.  Cognition:    The patient is oriented to person, place, and time;     recent and remote memory intact;     language fluent;     normal attention, concentration,     fund of knowledge Cranial Nerves:    The pupils are equal, round, and reactive to light.attempted fundoscopic exam could not visualize. Decreased vision in left eye appears more peripheral. Extraocular movements are intact. Trigeminal sensation is intact and the muscles of mastication are normal. The face is symmetric. The palate elevates in the midline. Hearing intact. Voice is normal. Shoulder shrug is normal. The tongue has normal motion without fasciculations.   Coordination:    Normal finger to nose and heel to shin. Normal rapid alternating movements.   Gait:    Heel-toe and tandem gait with imbalance, antalgic  Motor Observation: charcot joint feet, otherwise n o asymmetry, no atrophy, and no involuntary movements noted. Tone:    Normal muscle tone.    Posture:    Posture is normal. normal erect    Strength: right hip flexion 3/5, left 5-, 4/5 bilat leg flexion, intact extension, otherwise strength is V/V in the remaining muscles of the upper and lower limbs.      Sensation: intact to LT, pin prick, vibration, proprioception     Reflex Exam:  DTR's:    Deep tendon reflexes in the upper and lower extremities are slightly brisk but symmetric bilaterally.   Toes:    The toes are downgoing bilaterally.   Clonus:    Clonus is absent.       Assessment/Plan:  62 year old with multiple symptoms that sound like SPinal stenosis, concerning symptoms of urinary and bowel retension needs imaging asap.   Will extensively test with labwork and MRI lumbar spine appears to sound like spinal stenosis with neurogenic  claudication  If negative, emg/ncs and further imaging of  the thoracic and cervical spine. Not likely in the brain but wont rule out MRI brain in the future either.   Orders Placed This Encounter  Procedures  . MR LUMBAR SPINE WO CONTRAST  . DG Lumb Spine Flex&Ext Only  . B12 and Folate Panel  . Methylmalonic acid, serum  . CK  . Magnesium  . RPR  . Tissue transglutaminase, IgA  . Gliadin antibodies, serum  . Heavy metals, blood  . Vitamin E  . Vitamin B6  . Multiple Myeloma Panel (SPEP&IFE w/QIG)  . Basic Metabolic Panel  . B. burgdorfi Antibody  . NCV with EMG(electromyography)      Sarina Ill, MD  [facility] [address] [place], [place] [address]  Phone [phone] Fax [phone]

## 2017-12-19 NOTE — Patient Instructions (Signed)
MRI lumbar spine Labwork XR flex/ext Emg/ncs   Spinal Stenosis Spinal stenosis occurs when the open space (spinal canal) between the bones of your spine (vertebrae) narrows, putting pressure on the spinal cord or nerves. What are the causes? This condition is caused by areas of bone pushing into the central canals of your vertebrae. This condition may be present at birth (congenital), or it may be caused by:  Arthritic deterioration of your vertebrae (spinal degeneration). This usually starts around age 64.  Injury or trauma to the spine.  Tumors in the spine.  Calcium deposits in the spine.  What are the signs or symptoms? Symptoms of this condition include:  Pain in the neck or back that is generally worse with activities, particularly when standing and walking.  Numbness, tingling, hot or cold sensations, weakness, or weariness in your legs.  Pain going up and down the leg (sciatica).  Frequent episodes of falling.  A foot-slapping gait that leads to muscle weakness.  In more serious cases, you may develop:  Problemspassing stool or passing urine.  Difficulty having sex.  Loss of feeling in part or all of your leg.  Symptoms may come on slowly and get worse over time. How is this diagnosed? This condition is diagnosed based on your medical history and a physical exam. Tests will also be done, such as:  MRI.  CT scan.  X-ray.  How is this treated? Treatment for this condition often focuses on managing your pain and any other symptoms. Treatment may include:  Practicing good posture to lessen pressure on your nerves.  Exercising to strengthen muscles, build endurance, improve balance, and maintain good joint movement (range of motion).  Losing weight, if needed.  Taking medicines to reduce swelling, inflammation, or pain.  Assistive devices, such as a corset or brace.  In some cases, surgery may be needed. The most common procedure is decompression  laminectomy. This is done to remove excess bone that puts pressure on your nerve roots. Follow these instructions at home: Managing pain, stiffness, and swelling  Do all exercises and stretches as told by your health care provider.  Practice good posture. If you were given a brace or a corset, wear it as told by your health care provider.  Do not do any activities that cause pain. Ask your health care provider what activities are safe for you.  Do not lift anything that is heavier than 10 lb (4.5 kg) or the limit that your health care provider tells you.  Maintain a healthy weight. Talk with your health care provider if you need help losing weight.  If directed, apply heat to the affected area as often as told by your health care provider. Use the heat source that your health care provider recommends, such as a moist heat pack or a heating pad. ? Place a towel between your skin and the heat source. ? Leave the heat on for 20-30 minutes. ? Remove the heat if your skin turns bright red. This is especially important if you are not able to feel pain, heat, or cold. You may have a greater risk of getting burned. General instructions  Take over-the-counter and prescription medicines only as told by your health care provider.  Do not use any products that contain nicotine or tobacco, such as cigarettes and e-cigarettes. If you need help quitting, ask your health care provider.  Eat a healthy diet. This includes plenty of fruits and vegetables, whole grains, and low-fat (lean) protein.  Keep all  follow-up visits as told by your health care provider. This is important. Contact a health care provider if:  Your symptoms do not get better or they get worse.  You have a fever. Get help right away if:  You have new or worse pain in your neck or upper back.  You have severe pain that cannot be controlled with medicines.  You are dizzy.  You have vision problems, blurred vision, or double  vision.  You have a severe headache that is worse when you stand.  You have nausea or you vomit.  You develop new or worse numbness or tingling in your back or legs.  You have pain, redness, swelling, or warmth in your arm or leg. Summary  Spinal stenosis occurs when the open space (spinal canal) between the bones of your spine (vertebrae) narrows. This narrowing puts pressure on the spinal cord or nerves.  Spinal stenosis can cause numbness, weakness, or pain in the neck, back, and legs.  This condition may be caused by a birth defect, arthritic deterioration of your vertebrae, injury, tumors, or calcium deposits.  This condition is usually diagnosed with MRIs, CT scans, and X-rays. This information is not intended to replace advice given to you by your health care provider. Make sure you discuss any questions you have with your health care provider. Document Released: 07/20/2003 Document Revised: 04/03/2016 Document Reviewed: 04/03/2016 Elsevier Interactive Patient Education  [employer].

## 2017-12-20 LAB — B12 AND FOLATE PANEL
Folate: 5.3 ng/mL (ref >3.0)
Vitamin B-12: 323 pg/mL (ref 232–1245)

## 2017-12-22 ENCOUNTER — Encounter: Payer: Self-pay | Admitting: Neurology

## 2017-12-22 ENCOUNTER — Telehealth: Payer: Self-pay | Admitting: Neurology

## 2017-12-22 NOTE — Telephone Encounter (Signed)
lvm for pt to call back to schedule [facility] Auth: [id number] (exp. 12/22/17 to 01/20/18) & [employer] Auth: [place] via uhc website

## 2017-12-22 NOTE — Telephone Encounter (Signed)
Patient returned my call she is scheduled for 12/24/17 at [facility].

## 2017-12-24 ENCOUNTER — Ambulatory Visit (INDEPENDENT_AMBULATORY_CARE_PROVIDER_SITE_OTHER): Payer: BC Managed Care – PPO

## 2017-12-24 DIAGNOSIS — G834 Cauda equina syndrome: Secondary | ICD-10-CM | POA: Diagnosis not present

## 2017-12-24 DIAGNOSIS — R339 Retention of urine, unspecified: Secondary | ICD-10-CM | POA: Diagnosis not present

## 2017-12-24 DIAGNOSIS — M48062 Spinal stenosis, lumbar region with neurogenic claudication: Secondary | ICD-10-CM | POA: Diagnosis not present

## 2017-12-24 DIAGNOSIS — R2689 Other abnormalities of gait and mobility: Secondary | ICD-10-CM

## 2017-12-24 DIAGNOSIS — R150 Incomplete defecation: Secondary | ICD-10-CM | POA: Diagnosis not present

## 2017-12-24 DIAGNOSIS — R29898 Other symptoms and signs involving the musculoskeletal system: Secondary | ICD-10-CM

## 2017-12-24 LAB — MULTIPLE MYELOMA PANEL, SERUM
Albumin SerPl Elph-Mcnc: 3.6 g/dL (ref 2.9–4.4)
Albumin/Glob SerPl: 1.3 (ref 0.7–1.7)
Alpha 1: 0.3 g/dL (ref 0.0–0.4)
Alpha2 Glob SerPl Elph-Mcnc: 0.7 g/dL (ref 0.4–1.0)
B-Globulin SerPl Elph-Mcnc: 1 g/dL (ref 0.7–1.3)
Gamma Glob SerPl Elph-Mcnc: 0.9 g/dL (ref 0.4–1.8)
Globulin, Total: 2.8 g/dL (ref 2.2–3.9)
IgA/Immunoglobulin A, Serum: 106 mg/dL (ref 87–352)
IgG (Immunoglobin G), Serum: 903 mg/dL (ref 700–1600)
IgM (Immunoglobulin M), Srm: 76 mg/dL (ref 26–217)
Total Protein: 6.4 g/dL (ref 6.0–8.5)

## 2017-12-24 LAB — BASIC METABOLIC PANEL
BUN/Creatinine Ratio: 16 (ref 12–28)
BUN: 14 mg/dL (ref 8–27)
CO2: 25 mmol/L (ref 20–29)
Calcium: 9.8 mg/dL (ref 8.7–10.3)
Chloride: 102 mmol/L (ref 96–106)
Creatinine, Ser: 0.9 mg/dL (ref 0.57–1.00)
GFR calc Af Amer: 79 mL/min/{1.73_m2} (ref >59)
GFR calc non Af Amer: 69 mL/min/{1.73_m2} (ref >59)
Glucose: 90 mg/dL (ref 65–99)
Potassium: 4.3 mmol/L (ref 3.5–5.2)
Sodium: 141 mmol/L (ref 134–144)

## 2017-12-24 LAB — CK: Total CK: 62 U/L (ref 24–173)

## 2017-12-24 LAB — METHYLMALONIC ACID, SERUM: Methylmalonic Acid: 188 nmol/L (ref 0–378)

## 2017-12-24 LAB — VITAMIN E
Vitamin E (Alpha Tocopherol): 13.8 mg/L (ref 9.0–29.0)
Vitamin E(Gamma Tocopherol): 1.5 mg/L (ref 0.5–4.9)

## 2017-12-24 LAB — HEAVY METALS, BLOOD
Arsenic: 6 ug/L (ref 2–23)
Lead, Blood: 1 ug/dL (ref 0–4)
Mercury: 1.8 ug/L (ref 0.0–14.9)

## 2017-12-24 LAB — RPR: RPR Ser Ql: NONREACTIVE

## 2017-12-24 LAB — GLIADIN ANTIBODIES, SERUM
Antigliadin Abs, IgA: 4 units (ref 0–19)
Gliadin IgG: 4 units (ref 0–19)

## 2017-12-24 LAB — TISSUE TRANSGLUTAMINASE, IGA: Transglutaminase IgA: <2 U/mL (ref 0–3)

## 2017-12-24 LAB — VITAMIN B6: Vitamin B6: 4.3 ug/L (ref 2.0–32.8)

## 2017-12-24 LAB — B. BURGDORFI ANTIBODIES: Lyme IgG/IgM Ab: <0.91 {ISR} (ref 0.00–0.90)

## 2017-12-24 LAB — MAGNESIUM: Magnesium: 2.3 mg/dL (ref 1.6–2.3)

## 2018-01-08 ENCOUNTER — Ambulatory Visit: Payer: BC Managed Care – PPO | Admitting: Neurology

## 2018-01-13 ENCOUNTER — Ambulatory Visit (INDEPENDENT_AMBULATORY_CARE_PROVIDER_SITE_OTHER): Payer: BC Managed Care – PPO | Admitting: Obstetrics & Gynecology

## 2018-01-13 ENCOUNTER — Encounter: Payer: Self-pay | Admitting: Obstetrics & Gynecology

## 2018-01-13 VITALS — BP 116/80 | HR 76 | Resp 18 | Ht 71.0 in | Wt 178.8 lb

## 2018-01-13 DIAGNOSIS — N898 Other specified noninflammatory disorders of vagina: Secondary | ICD-10-CM

## 2018-01-13 DIAGNOSIS — R300 Dysuria: Secondary | ICD-10-CM

## 2018-01-13 LAB — POCT URINALYSIS DIPSTICK
Bilirubin, UA: NEGATIVE
Glucose, UA: NEGATIVE
Ketones, UA: NEGATIVE
Nitrite, UA: POSITIVE
Protein, UA: NEGATIVE
Urobilinogen, UA: 0.2 E.U./dL
pH, UA: 5 (ref 5.0–8.0)

## 2018-01-13 MED ORDER — NITROFURANTOIN MONOHYD MACRO 100 MG PO CAPS
100.0000 mg | ORAL_CAPSULE | Freq: Two times a day (BID) | ORAL | 0 refills | Status: DC
Start: 2018-01-13 — End: 2018-01-27

## 2018-01-13 NOTE — Progress Notes (Signed)
GYNECOLOGY  VISIT  CC:   Painful urination  HPI: 62 y.o. G48P2002 Married Caucasian female here for UTI symptoms.  Feels like she's had symptoms almost since her last infection in July.  Denies fever.  Denies back pain.  Having urgency, dysuria, and urinary frequency.  States it feels like "everything is on fire".  Reports the presence of "slimey" vaginal discharge that does not have any odor present.  Denies vaginal bleeding.    GYNECOLOGIC HISTORY: Patient's last menstrual period was 05/13/2005. Contraception: post menopausal  Menopausal hormone therapy: Vagifem   Patient Active Problem List   Diagnosis Date Noted  . 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 Urinary frequency 07/07/2015  . Rectal pressure 07/07/2015  . Arthralgia of hip 03/22/2014  . Arthralgia, sacroiliac 03/22/2014  . ANA positive 03/10/2014  . 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 Uterovaginal prolapse 09/17/2012    Past Medical History:  Diagnosis Date  . Abnormal Pap smear of cervix 12/2009   ASCUS cannot exclude HGSIL.  [name] Arthralgia of right hip 2015   Hip replacement   . Cystocele or rectocele with uterine prolapse   . Headache   . IBS (irritable bowel syndrome)   . Osteoarthritis   . Rectocele   . 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 From [facility]     Past Surgical History:  Procedure Laterality Date  . BUNIONECTOMY Right   . COLPOSCOPY  12/2009   negative  . FOOT SURGERY Right    bone removed   . hardward removal from right foot  12/15   Dr. Wardell Honour  . JOINT REPLACEMENT Right 2001   Hip replacement   . Posterior vaginal repair  10/21/2012   Procedure: VAGINAL REPAIR POSTERIOR; Surgeon: Daneil Dolin, MD; Location[facility] MAIN OR; Service: Gynecology;;  . UTERINE SUSPENSION  10/21/12   Procedure: UTEROSACRAL SUSPENSION; Surgeon: Daneil Dolin, MD; Location: [facility] MAIN OR; Service: Gynecology;;  . VAGINAL HYSTERECTOMY   10/21/12   Procedure: HYSTERECTOMY VAGINAL; Surgeon: Daneil Dolin, MD; Location: [place]; Service: Gynecology; Laterality: N/A; bilateral salpingectomy    MEDS:   Current Outpatient Medications on File Prior to Visit  Medication Sig Dispense Refill  . aspirin-acetaminophen-caffeine (EXCEDRIN MIGRAINE) 250-250-65 MG tablet Take by mouth as needed.     . Estradiol (VAGIFEM) 10 MCG TABS vaginal tablet Place 1 tablet (10 mcg total) vaginally 2 (two) times a week. 24 tablet 3  . fexofenadine (ALLEGRA) 180 MG tablet Take 180 mg by mouth daily.    [name] ibuprofen (ADVIL,MOTRIN) 200 MG tablet Take 200 mg by mouth every 6 (six) hours as needed.    . lidocaine (XYLOCAINE) 5 % ointment Apply small amt to area no more than twice daily 1.25 g 1  . omeprazole (PRILOSEC) 20 MG capsule TAKE 1 CAPSULE BY MOUTH EVERY DAY (Patient taking differently: TAKE 1 CAPSULE BY MOUTH EVERY DAY, as needed) 30 capsule 1  . solifenacin (VESICARE) 5 MG tablet Take 1 tablet (5 mg total) by mouth as needed. 90 tablet 4   No current facility-administered medications on file prior to visit.     ALLERGIES: Cephalexin; Ciprofloxacin; and Sulfa antibiotics  Family History  Problem Relation Age of Onset  . Rheum arthritis Mother   . Cancer Father   . Osteoarthritis Sister   . Osteoarthritis Brother   . Crohn's disease Son     SH:  Married, non smoker  Review of Systems  Genitourinary: Positive for  dysuria, frequency and urgency.       Night urination   All other systems reviewed and are negative.   PHYSICAL EXAMINATION:    BP 116/80 (BP Location: Right Arm, Patient Position: Sitting, Cuff Size: Large)   Pulse 76   Resp 18   Ht 5\' 11"  (1.803 m)   Wt 178 lb 12.8 oz (81.1 kg)   LMP 05/13/2005   BMI 24.94 kg/m     General appearance: alert, cooperative and appears stated age Flank:  No CVA tenderness Abdomen: soft, non-tender; bowel sounds normal; no masses,  no organomegaly Lymph:  no inguinal LAD  noted  Pelvic: External genitalia:  no lesions              Urethra:  normal appearing urethra with no masses, tenderness or lesions              Bartholins and Skenes: normal                 Vagina: normal appearing vagina with normal color, yellowish discharge is present, no lesions              Cervix: absent              Bimanual Exam:  Uterus:  uterus absent              Adnexa: no mass, fullness, tenderness  Chaperone was present for exam.  Assessment: Recurrent cystitis Vaginal discharger  Plan: Macrobid 100mg  bid x 5 days. Urine culture pending Affirm obtained Will plan to start estrogen vaginal cream after TOC is completed Probiotic use and increased water discussed as well

## 2018-01-13 NOTE — Patient Instructions (Signed)
Femdiphilus probiotic.  $24.  [place].

## 2018-01-14 ENCOUNTER — Other Ambulatory Visit: Payer: Self-pay | Admitting: Obstetrics & Gynecology

## 2018-01-14 DIAGNOSIS — N3090 Cystitis, unspecified without hematuria: Secondary | ICD-10-CM

## 2018-01-14 LAB — URINALYSIS, MICROSCOPIC ONLY: Casts: NONE SEEN /lpf

## 2018-01-14 LAB — VAGINITIS/VAGINOSIS, DNA PROBE
Candida Species: NEGATIVE
Gardnerella vaginalis: NEGATIVE
Trichomonas vaginosis: NEGATIVE

## 2018-01-14 NOTE — Progress Notes (Signed)
Urine culture and micro placed as future orders for follow-up.

## 2018-01-15 LAB — URINE CULTURE

## 2018-01-22 ENCOUNTER — Telehealth: Payer: Self-pay | Admitting: Neurology

## 2018-01-22 ENCOUNTER — Ambulatory Visit (INDEPENDENT_AMBULATORY_CARE_PROVIDER_SITE_OTHER): Payer: BC Managed Care – PPO | Admitting: Neurology

## 2018-01-22 DIAGNOSIS — R270 Ataxia, unspecified: Secondary | ICD-10-CM

## 2018-01-22 DIAGNOSIS — G600 Hereditary motor and sensory neuropathy: Secondary | ICD-10-CM

## 2018-01-22 DIAGNOSIS — R150 Incomplete defecation: Secondary | ICD-10-CM

## 2018-01-22 DIAGNOSIS — R339 Retention of urine, unspecified: Secondary | ICD-10-CM

## 2018-01-22 DIAGNOSIS — W19XXXD Unspecified fall, subsequent encounter: Secondary | ICD-10-CM

## 2018-01-22 DIAGNOSIS — E559 Vitamin D deficiency, unspecified: Secondary | ICD-10-CM

## 2018-01-22 DIAGNOSIS — Z0289 Encounter for other administrative examinations: Secondary | ICD-10-CM

## 2018-01-22 DIAGNOSIS — G3281 Cerebellar ataxia in diseases classified elsewhere: Secondary | ICD-10-CM

## 2018-01-22 DIAGNOSIS — R29898 Other symptoms and signs involving the musculoskeletal system: Secondary | ICD-10-CM

## 2018-01-22 DIAGNOSIS — G1229 Other motor neuron disease: Secondary | ICD-10-CM

## 2018-01-22 DIAGNOSIS — R2689 Other abnormalities of gait and mobility: Secondary | ICD-10-CM

## 2018-01-22 DIAGNOSIS — G1221 Amyotrophic lateral sclerosis: Secondary | ICD-10-CM

## 2018-01-22 NOTE — Progress Notes (Signed)
History: Patient with years of progressive leg weakness,ataxia, imbalance, numbness, slowly progressive. Also generalized sensation mostly in the calfs. Reviewed workup with patient today:  - MRI of the lumbar spine was unremarkabe without spinal stenosis or foraminal narrowing but some facet changes.  -  Extensive lab testing including Lyme, multiple myeloma panel, vitamin B6, vitamin E, heavy metals, gliadin antibodies, celiac's antibodies, RPR, magnesium, CK, methylmalonic acid, B12, folate were all normal.   - Patient has been tested in the past for ANA which was positive, RF and CCP negative, TSH normal, CK, sed rate, uric acid normal in 2017 but due to positive ANA she was referred to rheumatology and evaluated there as well.  She had a negative hepatitis C antibody May 2018.  Her vitamin D was in the normal range in 2017 but low 30 , recommended supplementation.  She was seen by [facility] rheumatology see scanned document December 10, 2017 in epic. 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  Will retest vitamin D and also image cervical spine and thoracic spine, MRI brain.   -Patient with years of progressive leg weakness,ataxia, imbalance, numbness, slowly progressive, distal atrophy in feet, high arches, hammer toes, extensive workup negative from multiple physicians. Need MRI brain, MRI cervical spine and MRI thoracic spine to evaluate for upper motor neuron lesion, multiple sclerosis, motor neuron disease or other etiology. May have Charcot Marie Tooth with send a genetic panel from [facility] and refer to [facility] clinic.  MRI lumbar spine: reviewed images and agree with the following: On axial views:  L1-2: disc bulging and facet hypertrophy with no spinal stenosis or foraminal narrowing  L2-3: disc bulging and facet hypertrophy with no spinal  stenosis or foraminal narrowing  L3-4: disc bulging and facet hypertrophy with no spinal stenosis or foraminal narrowing  L4-5: no spinal stenosis or foraminal narrowing  L5-S1: no spinal stenosis or foraminal narrowing   A total of 40 minutes was spent face-to-face with this patient. Over half this time was spent on counseling patient on the  1. Ataxia   2. Weakness of both lower extremities   3. Incomplete defecation   4. Urinary retention   5. 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 Fall, subsequent encounter   9. 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 MR BRAIN W WO CONTRAST  . MR CERVICAL SPINE WO CONTRAST  . MR THORACIC SPINE WO CONTRAST  . Vitamin D, 25-hydroxy  . Basic Metabolic Panel      diagnosis and different diagnostic and therapeutic options, counseling and coordination of care, risks ans benefits of management, compliance, or risk factor reduction and education.  This does not include time spent on emg/ncs.

## 2018-01-22 NOTE — Telephone Encounter (Signed)
MR Brain w/wo contrast, MR Cervical spine wo contrast & MR Thoracic spine wo contrast Dr. Ihor Dow Auth: [id number] (exp. 01/22/18 to 02/20/18) & [employer] Auth: [place] via uhc website. Patient is scheduled for 01/28/18 at [facility].

## 2018-01-22 NOTE — Progress Notes (Signed)
See procedure note.

## 2018-01-26 NOTE — Progress Notes (Signed)
Full Name: Lauren Osborne Gender: Female MRN #: [id number] Date of Birth: December 11, 1955    Visit Date: 01/22/2018 08:46 Age: 62 Years 6 Months Old Examining Physician: Sarina Ill, MD  Referring Physician: Jaynee Eagles MD  History: Patient with years of progressive leg weakness,ataxia, imbalance, numbness, slowly progressive, distal atrophy in feet, high arches, hammer toes  Summary: EMG/NCS was performed on the bilateral lower extremities.  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   Sarina Ill, M.D.  [facility] [place], [place] [address] Tel: [phone] Fax: [phone]        [facility]    Nerve / Sites Muscle Latency Ref. Amplitude Ref. Rel Amp Segments Distance Velocity Ref. Area    ms ms mV mV %  cm m/s m/s mVms  R Peroneal - EDB     Ankle EDB 4.7 ?6.5 2.4 ?2.0 100 Ankle - EDB 9   11.6     Fib head EDB 11.8  2.4  100 Fib head - Ankle 33 47 ?44 10.2     Pop fossa EDB 13.8  2.5  102 Pop fossa - Fib head 10 51 ?44 9.9         Pop fossa - Ankle      L Peroneal - EDB     Ankle EDB 5.4 ?6.5 3.1 ?2.0 100 Ankle - EDB 9   11.7     Fib head EDB 14.5  1.8  59.1 Fib head - Ankle 33 36 ?44 5.9     Pop fossa EDB 16.7  2.3  122 Pop fossa - Fib head 10 47 ?44 8.8         Pop fossa - Ankle      R Tibial - AH     Ankle AH 3.9 ?5.8 10.3 ?4.0 100 Ankle - AH 9   22.8     Pop fossa AH 13.2  7.1  68.7 Pop fossa - Ankle 42 45 ?41 20.9  L Tibial - AH     Ankle AH 4.3 ?5.8 6.6 ?4.0 100 Ankle - AH 9   17.1     Pop fossa AH 13.2  3.6  55.4 Pop fossa - Ankle 42 47 ?41 10.5             SNC    Nerve / Sites Rec. Site Peak Lat Ref.  Amp Ref. Segments Distance     ms ms V V  cm  R Sural - Ankle (Calf)     Calf Ankle 4.0 ?4.4 8 ?6 Calf - Ankle 14  L Sural - Ankle (Calf)     Calf Ankle 4.2 ?4.4 8 ?6 Calf - Ankle 14  R Superficial peroneal - Ankle     Lat leg Ankle 3.7 ?4.4 7 ?6 Lat leg - Ankle 14  L Superficial peroneal - Ankle     Lat leg Ankle 4.2 ?4.4 6 ?6 Lat leg - Ankle 14             F  Wave    Nerve F Lat Ref.   ms ms  R Tibial - AH 55.9 ?56.0  L Tibial - AH 55.2 ?56.0         EMG full  EMG Summary Table    Spontaneous MUAP Recruitment  Muscle IA Fib PSW Fasc Other Amp Dur. Poly Pattern  L. Abductor hallucis  decreased Present None None _______ Normal Normal Normal Normal  L. Iliopsoas Normal None None None _______ Normal Normal Normal Normal  R. Iliopsoas Normal None None None _______ Normal Normal Normal Normal  L. Vastus medialis Normal None None None _______ Normal Normal Normal Normal  R. Vastus medialis Normal None None None _______ Normal Normal Normal Normal  L. Gastrocnemius (Medial head) Normal None None None _______ Normal Normal Normal Normal  R. Gastrocnemius (Medial head) Normal None None None _______ Normal Normal Normal Normal  L. Tibialis anterior Normal None None None _______ Normal Normal Normal Normal  R. Tibialis anterior Normal None None None _______ Normal Normal Normal Normal  R. Abductor hallucis decreased present None None _______ Normal Normal Normal Normal  L. Extensor hallucis longus Normal None None None _______ Normal Normal Normal Normal  R. Extensor hallucis longus Normal None None None _______ Normal Normal Normal Normal  L. Biceps femoris (long head) Normal None None None _______ Normal Normal Normal Normal  R. Biceps femoris (long head) Normal None None None _______ Normal Normal Normal Normal  L. Gluteus maximus Normal None None None _______ Normal Normal Normal Normal  R. Gluteus maximus Normal None None None _______ Normal Normal Normal Normal  L. Lumbar paraspinals (low) Normal None  None None _______ Normal Normal Normal Normal  R. Lumbar paraspinals (low) Normal None None None _______ Normal Normal Normal Normal

## 2018-01-26 NOTE — Procedures (Signed)
Full Name: Lauren Osborne Gender: Female MRN #: [id number] Date of Birth: 08-04-1955    Visit Date: 01/22/2018 08:46 Age: 62 Years 55 Months Old Examining Physician: Sarina Ill, MD  Referring Physician: Jaynee Eagles MD  History: Patient with years of progressive leg weakness,ataxia, imbalance, numbness, slowly progressive, distal atrophy in feet, high arches, hammer toes  Summary: EMG/NCS was performed on the bilateral lower extremities.  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   Sarina Ill, M.D.  [facility] [place], [place] [address] Tel: [phone] Fax: [phone]        [facility]    Nerve / Sites Muscle Latency Ref. Amplitude Ref. Rel Amp Segments Distance Velocity Ref. Area    ms ms mV mV %  cm m/s m/s mVms  R Peroneal - EDB     Ankle EDB 4.7 ?6.5 2.4 ?2.0 100 Ankle - EDB 9   11.6     Fib head EDB 11.8  2.4  100 Fib head - Ankle 33 47 ?44 10.2     Pop fossa EDB 13.8  2.5  102 Pop fossa - Fib head 10 51 ?44 9.9         Pop fossa - Ankle      L Peroneal - EDB     Ankle EDB 5.4 ?6.5 3.1 ?2.0 100 Ankle - EDB 9   11.7     Fib head EDB 14.5  1.8  59.1 Fib head - Ankle 33 36 ?44 5.9     Pop fossa EDB 16.7  2.3  122 Pop fossa - Fib head 10 47 ?44 8.8         Pop fossa - Ankle      R Tibial - AH     Ankle AH 3.9 ?5.8 10.3 ?4.0 100 Ankle - AH 9   22.8     Pop fossa AH 13.2  7.1  68.7 Pop fossa - Ankle 42 45 ?41 20.9  L Tibial - AH     Ankle AH 4.3 ?5.8 6.6 ?4.0 100 Ankle - AH 9   17.1     Pop fossa AH 13.2  3.6  55.4 Pop fossa - Ankle 42 47 ?41 10.5             SNC    Nerve / Sites Rec. Site Peak Lat Ref.  Amp Ref. Segments Distance     ms ms V V  cm  R Sural - Ankle (Calf)     Calf Ankle 4.0 ?4.4 8 ?6 Calf - Ankle 14  L Sural - Ankle (Calf)     Calf Ankle 4.2 ?4.4 8 ?6 Calf - Ankle 14  R Superficial peroneal - Ankle     Lat leg Ankle 3.7 ?4.4 7 ?6 Lat leg - Ankle 14  L Superficial peroneal - Ankle     Lat leg Ankle 4.2 ?4.4 6 ?6 Lat leg - Ankle 14             F  Wave    Nerve F Lat Ref.   ms ms  R Tibial - AH 55.9 ?56.0  L Tibial - AH 55.2 ?56.0         EMG full  EMG Summary Table    Spontaneous MUAP Recruitment  Muscle IA Fib PSW Fasc Other Amp Dur. Poly Pattern  L. Abductor hallucis  decreased Present None None _______ Normal Normal Normal Normal  L. Iliopsoas Normal None None None _______ Normal Normal Normal Normal  R. Iliopsoas Normal None None None _______ Normal Normal Normal Normal  L. Vastus medialis Normal None None None _______ Normal Normal Normal Normal  R. Vastus medialis Normal None None None _______ Normal Normal Normal Normal  L. Gastrocnemius (Medial head) Normal None None None _______ Normal Normal Normal Normal  R. Gastrocnemius (Medial head) Normal None None None _______ Normal Normal Normal Normal  L. Tibialis anterior Normal None None None _______ Normal Normal Normal Normal  R. Tibialis anterior Normal None None None _______ Normal Normal Normal Normal  R. Abductor hallucis decreased present None None _______ Normal Normal Normal Normal  L. Extensor hallucis longus Normal None None None _______ Normal Normal Normal Normal  R. Extensor hallucis longus Normal None None None _______ Normal Normal Normal Normal  L. Biceps femoris (long head) Normal None None None _______ Normal Normal Normal Normal  R. Biceps femoris (long head) Normal None None None _______ Normal Normal Normal Normal  L. Gluteus maximus Normal None None None _______ Normal Normal Normal Normal  R. Gluteus maximus Normal None None None _______ Normal Normal Normal Normal  L. Lumbar paraspinals (low) Normal None  None None _______ Normal Normal Normal Normal  R. Lumbar paraspinals (low) Normal None None None _______ Normal Normal Normal Normal

## 2018-01-27 ENCOUNTER — Ambulatory Visit (INDEPENDENT_AMBULATORY_CARE_PROVIDER_SITE_OTHER): Payer: BC Managed Care – PPO | Admitting: Obstetrics & Gynecology

## 2018-01-27 ENCOUNTER — Encounter: Payer: Self-pay | Admitting: Obstetrics & Gynecology

## 2018-01-27 DIAGNOSIS — N3090 Cystitis, unspecified without hematuria: Secondary | ICD-10-CM

## 2018-01-27 LAB — POCT URINALYSIS DIPSTICK
Bilirubin, UA: NEGATIVE
Glucose, UA: NEGATIVE
Ketones, UA: NEGATIVE
Leukocytes, UA: NEGATIVE
Nitrite, UA: POSITIVE
Protein, UA: NEGATIVE
Urobilinogen, UA: 0.2 E.U./dL
pH, UA: 5 (ref 5.0–8.0)

## 2018-01-27 MED ORDER — AMOXICILLIN-POT CLAVULANATE 875-125 MG PO TABS: 1.0000 | ORAL_TABLET | Freq: Two times a day (BID) | ORAL | 0 refills | Status: DC

## 2018-01-27 MED ORDER — URIBEL 118 MG PO CAPS: 1.0000 | ORAL_CAPSULE | Freq: Four times a day (QID) | ORAL | 0 refills | Status: DC

## 2018-01-27 NOTE — Progress Notes (Signed)
GYNECOLOGY  VISIT  CC:   dysuria  HPI: 62 y.o. G14P2002 Married White or Caucasian female here for follow up UTI.  She did complete the antibiotics but doesn't feel any different.  Continues to have urinary urgency and pelvic pressure.  Has no fever.  She has no back pain.  Has not tried anything else for symptoms including OTC products.  She's had two positive urine cultures with E coli.  There are multiple drug resistances with the two cultures but they've both been sensitive to Macrobid.  Has not started the vaginal estrogen cream.  GYNECOLOGIC HISTORY: Patient's last menstrual period was 05/13/2005. Contraception: post menopausal  Menopausal hormone therapy: none  Patient Active Problem List   Diagnosis Date Noted  . GERD (gastroesophageal reflux disease) 11/28/2017  . 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 Urinary frequency 07/07/2015  . Rectal pressure 07/07/2015  . Arthralgia of hip 03/22/2014  . Arthralgia, sacroiliac 03/22/2014  . ANA positive 03/10/2014  . 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 Uterovaginal prolapse 09/17/2012    Past Medical History:  Diagnosis Date  . Abnormal Pap smear of cervix 12/2009   ASCUS cannot exclude HGSIL.  [name] Arthralgia of right hip 2015   Hip replacement   . Cystocele or rectocele with uterine prolapse   . Headache   . IBS (irritable bowel syndrome)   . Osteoarthritis   . Rectocele   . 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 From [facility]     Past Surgical History:  Procedure Laterality Date  . BUNIONECTOMY Right   . COLPOSCOPY  12/2009   negative  . FOOT SURGERY Right    bone removed   . hardward removal from right foot  12/15   Dr. Wardell Honour  . JOINT REPLACEMENT Right 2001   Hip replacement   . Posterior vaginal repair  10/21/2012   Procedure: VAGINAL REPAIR POSTERIOR; Surgeon: Daneil Dolin, MD; Location: [facility] MAIN OR; Service: Gynecology;;  . UTERINE SUSPENSION   10/21/12   Procedure: UTEROSACRAL SUSPENSION; Surgeon: Daneil Dolin, MD; Location: [facility] MAIN OR; Service: Gynecology;;  . VAGINAL HYSTERECTOMY  10/21/12   Procedure: HYSTERECTOMY VAGINAL; Surgeon: Daneil Dolin, MD; Location: [facility]; Service: Gynecology; Laterality: N/A; bilateral salpingectomy    MEDS:   Current Outpatient Medications on File Prior to Visit  Medication Sig Dispense Refill  . aspirin-acetaminophen-caffeine (EXCEDRIN MIGRAINE) 250-250-65 MG tablet Take by mouth as needed.     . Estradiol (VAGIFEM) 10 MCG TABS vaginal tablet Place 1 tablet (10 mcg total) vaginally 2 (two) times a week. 24 tablet 3  . fexofenadine (ALLEGRA) 180 MG tablet Take 180 mg by mouth daily.    [name] ibuprofen (ADVIL,MOTRIN) 200 MG tablet Take 200 mg by mouth every 6 (six) hours as needed.    . lidocaine (XYLOCAINE) 5 % ointment Apply small amt to area no more than twice daily 1.25 g 1  . omeprazole (PRILOSEC) 20 MG capsule TAKE 1 CAPSULE BY MOUTH EVERY DAY (Patient taking differently: TAKE 1 CAPSULE BY MOUTH EVERY DAY, as needed) 30 capsule 1  . solifenacin (VESICARE) 5 MG tablet Take 1 tablet (5 mg total) by mouth as needed. 90 tablet 4   No current facility-administered medications on file prior to visit.     ALLERGIES: Cephalexin; Ciprofloxacin; and Sulfa antibiotics  Family History  Problem Relation Age of Onset  . Rheum arthritis Mother   . Cancer Father   . Osteoarthritis Sister   .  Osteoarthritis Brother   . Crohn's disease Son     SH:  Married, non smoker  Review of Systems  Genitourinary: Positive for frequency and urgency.       Night urination   All other systems reviewed and are negative.   PHYSICAL EXAMINATION:    BP 104/68 (BP Location: Right Arm, Patient Position: Sitting, Cuff Size: Large)   Pulse 80   Resp 16   Ht 5\' 11"  (1.803 m)   Wt 180 lb 3.2 oz (81.7 kg)   LMP 05/13/2005   BMI 25.13 kg/m     General appearance: alert, cooperative and  appears stated age Flank:  No CVA tenderness Abdomen: soft, non-tender; bowel sounds normal; no masses,  no organomegaly Lymph:  no inguinal LAD noted  Pelvic: Performed last visit  Assessment: Cystitis, chronic at this point Multiple drug sensitivities/allergies  Plan: Urine culture pending Will treat with Augmentin 875 BID x 5 day Uribel rx sent to help with symptoms

## 2018-01-28 ENCOUNTER — Ambulatory Visit (INDEPENDENT_AMBULATORY_CARE_PROVIDER_SITE_OTHER): Payer: BC Managed Care – PPO

## 2018-01-28 DIAGNOSIS — R270 Ataxia, unspecified: Secondary | ICD-10-CM

## 2018-01-28 DIAGNOSIS — R150 Incomplete defecation: Secondary | ICD-10-CM

## 2018-01-28 DIAGNOSIS — G1221 Amyotrophic lateral sclerosis: Secondary | ICD-10-CM

## 2018-01-28 DIAGNOSIS — W19XXXD Unspecified fall, subsequent encounter: Secondary | ICD-10-CM

## 2018-01-28 DIAGNOSIS — R29898 Other symptoms and signs involving the musculoskeletal system: Secondary | ICD-10-CM

## 2018-01-28 DIAGNOSIS — G1229 Other motor neuron disease: Secondary | ICD-10-CM

## 2018-01-28 DIAGNOSIS — R339 Retention of urine, unspecified: Secondary | ICD-10-CM

## 2018-01-28 DIAGNOSIS — R2689 Other abnormalities of gait and mobility: Secondary | ICD-10-CM

## 2018-01-28 DIAGNOSIS — G3281 Cerebellar ataxia in diseases classified elsewhere: Secondary | ICD-10-CM | POA: Diagnosis not present

## 2018-01-28 LAB — URINALYSIS, MICROSCOPIC ONLY: Casts: NONE SEEN /lpf

## 2018-01-28 MED ORDER — GADOPENTETATE DIMEGLUMINE 469.01 MG/ML IV SOLN
15.0000 mL | Freq: Once | INTRAVENOUS | Status: AC | PRN
  Administered 2018-01-28: 15 mL via INTRAVENOUS

## 2018-01-29 ENCOUNTER — Telehealth: Payer: Self-pay | Admitting: Obstetrics & Gynecology

## 2018-01-29 ENCOUNTER — Telehealth: Payer: Self-pay | Admitting: *Deleted

## 2018-01-29 NOTE — Telephone Encounter (Signed)
Patient want to give the nurse a report on how she is doing.

## 2018-01-29 NOTE — Telephone Encounter (Signed)
Yes it is ok to take the uribel.  She can take is several times a day if needed as per the directions.

## 2018-01-29 NOTE — Telephone Encounter (Signed)
Detect neuromuscular disorders genetic testing form completed for [facility] and signed by Dr. Jaynee Eagles. Faxed to [facility]. Received a receipt of confirmation.

## 2018-01-29 NOTE — Telephone Encounter (Signed)
Spoke with patient, advised as seen below per Dr. Miller. Patient verbalizes understanding and is agreeable.   Encounter closed.  

## 2018-01-29 NOTE — Telephone Encounter (Signed)
Spoke with patient, calling with update from 9/17 OV. Started Augmentin 9/17, has taken1 dose of uribel. Symptoms improved with uribel. Did not take uribel today, reports "constant fluttery feeling in vaginal/pelvic area". Asking if ok to take uribel again?   No change in frequency and urgency.  No new symptoms. Denies lower back pain, fever/chills.   Advised patient urine culture not back yet, takes at least 3 days, continue abx. Will review with Dr. Sabra Heck and return call to advise on uribel.   Dr. Sabra Heck -please advise.

## 2018-01-30 LAB — URINE CULTURE

## 2018-02-03 ENCOUNTER — Ambulatory Visit (INDEPENDENT_AMBULATORY_CARE_PROVIDER_SITE_OTHER): Payer: BC Managed Care – PPO | Admitting: *Deleted

## 2018-02-03 VITALS — BP 118/70 | HR 80 | Resp 16 | Wt 179.0 lb

## 2018-02-03 DIAGNOSIS — N390 Urinary tract infection, site not specified: Secondary | ICD-10-CM | POA: Diagnosis not present

## 2018-02-03 NOTE — Progress Notes (Signed)
Patient here to leave urine for a urine culture. Will send to lab and call patient with results -eh

## 2018-02-04 ENCOUNTER — Ambulatory Visit: Payer: BC Managed Care – PPO

## 2018-02-06 ENCOUNTER — Other Ambulatory Visit: Payer: Self-pay | Admitting: Obstetrics & Gynecology

## 2018-02-06 ENCOUNTER — Other Ambulatory Visit: Payer: Self-pay | Admitting: *Deleted

## 2018-02-06 DIAGNOSIS — N390 Urinary tract infection, site not specified: Secondary | ICD-10-CM

## 2018-02-06 LAB — URINE CULTURE

## 2018-02-06 MED ORDER — AMOXICILLIN-POT CLAVULANATE 875-125 MG PO TABS: 1.0000 | ORAL_TABLET | Freq: Two times a day (BID) | ORAL | 0 refills | Status: DC

## 2018-02-06 NOTE — Progress Notes (Signed)
Referral to urology placed

## 2018-02-12 DIAGNOSIS — N3020 Other chronic cystitis without hematuria: Secondary | ICD-10-CM | POA: Diagnosis not present

## 2018-02-12 DIAGNOSIS — N139 Obstructive and reflux uropathy, unspecified: Secondary | ICD-10-CM | POA: Diagnosis not present

## 2018-02-12 DIAGNOSIS — N952 Postmenopausal atrophic vaginitis: Secondary | ICD-10-CM | POA: Diagnosis not present

## 2018-03-09 ENCOUNTER — Telehealth: Payer: Self-pay | Admitting: Obstetrics & Gynecology

## 2018-03-09 DIAGNOSIS — R311 Benign essential microscopic hematuria: Secondary | ICD-10-CM | POA: Diagnosis not present

## 2018-03-09 NOTE — Telephone Encounter (Signed)
Patient is calling to request a referral from Dr. Sabra Heck so that she can re-establish care with her prior cardiologist Dr. Mertie Moores. She contacted their office directly but was unable to schedule without a referral.  She states she was hiking this weekend [place] and developed some shortness of breath that has resolved.  Does not have any symptoms at this time.  She states she has discussed these issues with Dr. Sabra Heck previously about her GERD and chest pain and she has seen GI and now thinks she should see Cardiology. States that she does not want to "get another doctor involved with needing a referral."   Advised will send her request to Dr. Sabra Heck to review. Pt agreeable.

## 2018-03-09 NOTE — Telephone Encounter (Signed)
Patient would like a referral to Dr. Mertie Moores, cardiologist. His office number is [phone].

## 2018-03-11 NOTE — Telephone Encounter (Signed)
Attempt to call Dr Cathie Olden ( now part of [facility]) at [phone]. Caller number [phone] in que. Unable to hold.

## 2018-03-11 NOTE — Telephone Encounter (Signed)
I don't seen any cardiology notes.  Please check with Dr. Julious Payer office to see how long it's been since she was a patient there.  If it's a long as I think, she should start with her PCP.  I'm sorry.

## 2018-03-12 NOTE — Telephone Encounter (Signed)
Called [facility]. They reviewed chart. It is has been longer than 4 years since last visit. Will need referral.   Patient saw PCP Dr. Carlota Raspberry 08/28/17 for musculoskeletal chest pain. Will advise pt needs referral from PCP.   Call to patient and advised.  She will call PCP for cardiology referral.   Encounter closed.

## 2018-04-13 ENCOUNTER — Encounter: Payer: Self-pay | Admitting: Allergy and Immunology

## 2018-04-13 ENCOUNTER — Ambulatory Visit (INDEPENDENT_AMBULATORY_CARE_PROVIDER_SITE_OTHER): Payer: BC Managed Care – PPO | Admitting: Allergy and Immunology

## 2018-04-13 VITALS — BP 126/82 | HR 77 | Temp 98.2°F | Resp 20 | Ht 70.0 in | Wt 176.0 lb

## 2018-04-13 DIAGNOSIS — J3089 Other allergic rhinitis: Secondary | ICD-10-CM

## 2018-04-13 DIAGNOSIS — H1013 Acute atopic conjunctivitis, bilateral: Secondary | ICD-10-CM | POA: Diagnosis not present

## 2018-04-13 DIAGNOSIS — R0609 Other forms of dyspnea: Secondary | ICD-10-CM | POA: Diagnosis not present

## 2018-04-13 DIAGNOSIS — R0600 Dyspnea, unspecified: Secondary | ICD-10-CM | POA: Insufficient documentation

## 2018-04-13 DIAGNOSIS — H1010 Acute atopic conjunctivitis, unspecified eye: Secondary | ICD-10-CM | POA: Insufficient documentation

## 2018-04-13 MED ORDER — ALBUTEROL SULFATE HFA 108 (90 BASE) MCG/ACT IN AERS: 1.0000 | INHALATION_SPRAY | RESPIRATORY_TRACT | 2 refills | Status: DC | PRN

## 2018-04-13 MED ORDER — CARBINOXAMINE MALEATE 4 MG PO TABS: 4.0000 mg | ORAL_TABLET | Freq: Three times a day (TID) | ORAL | 1 refills | Status: DC | PRN

## 2018-04-13 MED ORDER — AZELASTINE HCL 0.15 % NA SOLN: NASAL | 5 refills | Status: DC

## 2018-04-13 MED ORDER — AZELASTINE HCL 0.05 % OP SOLN: 1.0000 [drp] | Freq: Two times a day (BID) | OPHTHALMIC | 5 refills | Status: AC

## 2018-04-13 NOTE — Assessment & Plan Note (Signed)
The patients sensation of air-hunger, not being able to get a full breath on inspiration, which may eventually be relieved by a yawn suggests sighing dyspnea. Other less likely etiologies include vocal cord dysfunction and asthma. Spirometry today was normal.  Diaphragmatic breathing, or belly breathing, has been discussed with the patient as this technique often times relieves sighing dyspnea.  As a therapeutic trial, a prescription has been provided for albuterol HFA, 1 to 2 inhalations every 4-6 hours if needed.  The patient is to monitor for symptom relief, or lack thereof, from this medication.  The patients subjective and objective measures a pulmonary function will be followed and the treatment plan will be adjusted accordingly. We will recheck spirometry on the next visit.

## 2018-04-13 NOTE — Progress Notes (Signed)
New Patient Note  RE: Lauren Osborne MRN: [id number] DOB: March 02, 1956 Date of Office Visit: 04/13/2018  Referring provider: [name], MD Primary care provider: [name], MD  Chief Complaint: Breathing Problem and Allergic Rhinitis    History of present illness: Lauren Osborne is a 62 y.o. female seen today in consultation requested by [name], MD.  She experiences nasal congestion, rhinorrhea, and occasional lacrimation.  She reports that her nasal symptoms seem to be triggered by rapid weather changes, rainy weather, chemicals, and strong aromas such as certain perfumes and colognes.  Otherwise, no significant seasonal symptom variation has been noted nor have specific environmental triggers been identified.  Over-the-counter second-generation antihistamines have failed to alleviate the symptoms.  She has found symptom relief with diphenhydramine, however this medication causes somnolence. She also complains that over the past few years she has experienced difficulty getting a full breath of air.  She states that at times she starts "yawning like crazy" but is unable to attain a deep satisfying breath.  Eventually, a yawn brings relief.  She does not experience coughing, chest tightness, or wheezing.  She is unable to identify any specific triggers for the dyspnea.  Assessment and plan: Perennial allergic rhinitis with a predominantly nonallergic component  Aeroallergen avoidance measures have been discussed and provided in written form.  A prescription has been provided for azelastine nasal spray, 1-2 sprays per nostril 2 times daily as needed. Proper nasal spray technique has been discussed and demonstrated.   Nasal saline spray (i.e., Simply Saline) or nasal saline lavage (i.e., NeilMed) is recommended as needed and prior to medicated nasal sprays.  A prescription has been provided for carbinoxamine 4 mg every 8 hours if needed  Dyspnea The patients  sensation of air-hunger, not being able to get a full breath on inspiration, which may eventually be relieved by a yawn suggests sighing dyspnea. Other less likely etiologies include vocal cord dysfunction and asthma. Spirometry today was normal.  Diaphragmatic breathing, or belly breathing, has been discussed with the patient as this technique often times relieves sighing dyspnea.  As a therapeutic trial, a prescription has been provided for albuterol HFA, 1 to 2 inhalations every 4-6 hours if needed.  The patient is to monitor for symptom relief, or lack thereof, from this medication.  The patients subjective and objective measures a pulmonary function will be followed and the treatment plan will be adjusted accordingly. We will recheck spirometry on the next visit.  Allergic conjunctivitis  Treatment plan as outlined above for allergic rhinitis.  A prescription has been provided for Optivar eyedrops, 1 drop per eye twice daily if needed.   Meds ordered this encounter  Medications  . Azelastine HCl 0.15 % SOLN    Sig: 1-2 sprays per nostril 2 times daily as needed    Dispense:  30 mL    Refill:  5  . Carbinoxamine Maleate 4 MG TABS    Sig: Take 1 tablet (4 mg total) by mouth every 8 (eight) hours as needed.    Dispense:  30 each    Refill:  1  . albuterol (PROVENTIL HFA;VENTOLIN HFA) 108 (90 Base) MCG/ACT inhaler    Sig: Inhale 1-2 puffs into the lungs every 4 (four) hours as needed for wheezing or shortness of breath.    Dispense:  18 g    Refill:  2  . azelastine (OPTIVAR) 0.05 % ophthalmic solution    Sig: Place 1 drop into both eyes 2 (  two) times daily.    Dispense:  6 mL    Refill:  5    Diagnostics: Spirometry: Normal with an FEV1 of 114% predicted.  Epicutaneous testing: Negative despite a positive histamine control. Intradermal testing: Positive to major mold mix #3. Food allergen skin testing: Negative despite a positive histamine control.    Physical  examination: Blood pressure 126/82, pulse 77, temperature 98.2 F (36.8 C), temperature source Oral, resp. rate 20, height 5\' 10"  (1.778 m), weight 176 lb (79.8 kg), last menstrual period 05/13/2005, SpO2 95 %.  General: Alert, interactive, in no acute distress. HEENT: TMs pearly gray, turbinates moderately edematous without discharge, post-pharynx moderately erythematous. Neck: Supple without lymphadenopathy. Lungs: Clear to auscultation without wheezing, rhonchi or rales. CV: Normal S1, S2 without murmurs. Abdomen: Nondistended, nontender. Skin: Warm and dry, without lesions or rashes. Extremities:  No clubbing, cyanosis or edema. Neuro:   Grossly intact.  Review of systems:  Review of systems negative except as noted in HPI / PMHx or noted below: Review of Systems  Constitutional: Negative.   HENT: Negative.   Eyes: Negative.   Respiratory: Negative.   Cardiovascular: Negative.   Gastrointestinal: Negative.   Genitourinary: Negative.   Musculoskeletal: Negative.   Skin: Negative.   Neurological: Negative.   Endo/Heme/Allergies: Negative.   Psychiatric/Behavioral: Negative.     Past medical history:  Past Medical History:  Diagnosis Date  . Abnormal Pap smear of cervix 12/2009   ASCUS cannot exclude HGSIL.  [name] Arthralgia of right hip 2015   Hip replacement   . Cystocele or rectocele with uterine prolapse   . Headache   . IBS (irritable bowel syndrome)   . Osteoarthritis   . Rectocele   . 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 From [facility]     Past surgical history:  Past Surgical History:  Procedure Laterality Date  . BUNIONECTOMY Right   . COLPOSCOPY  12/2009   negative  . FOOT SURGERY Right    bone removed   . hardward removal from right foot  12/15   Dr. Wardell Honour  . JOINT REPLACEMENT Right 2001   Hip replacement   . Posterior vaginal repair  10/21/2012   Procedure: VAGINAL REPAIR POSTERIOR; Surgeon: Daneil Dolin, MD; Location: [place]; Service: Gynecology;;  . SINOSCOPY    . TONSILLECTOMY    . UTERINE SUSPENSION  10/21/12   Procedure: UTEROSACRAL SUSPENSION; Surgeon: Daneil Dolin, MD; Location: [facility] MAIN OR; Service: Gynecology;;  . VAGINAL HYSTERECTOMY  10/21/12   Procedure: HYSTERECTOMY VAGINAL; Surgeon: Daneil Dolin, MD; Location: [place]; Service: Gynecology; Laterality: N/A; bilateral salpingectomy    Family history: Family History  Problem Relation Age of Onset  . Rheum arthritis Mother   . Cancer Father   . Osteoarthritis Sister   . Osteoarthritis Brother   . Crohn's disease Son   . Allergic rhinitis Neg Hx   . Asthma Neg Hx   . Eczema Neg Hx   . Urticaria Neg Hx     Social history: Social History   Socioeconomic History  . Marital status: Married    Spouse name: Not on file  . Number of children: Not on file  . Years of education: Not on file  . Highest education level: Not on file  Occupational History  . Not on file  Social Needs  . Financial resource strain: Not on file  . Food insecurity:    Worry: Not on  file    Inability: Not on file  . Transportation needs:    Medical: Not on file    Non-medical: Not on file  Tobacco Use  . Smoking status: Never Smoker  . Smokeless tobacco: Never Used  Substance and Sexual Activity  . Alcohol use: Yes    Alcohol/week: 4.0 standard drinks    Types: 4 Glasses of wine per week    Comment: occ  . Drug use: Never  . Sexual activity: Yes    Comment: Hysterectomy   Lifestyle  . Physical activity:    Days per week: Not on file    Minutes per session: Not on file  . Stress: Not on file  Relationships  . Social connections:    Talks on phone: Not on file    Gets together: Not on file    Attends religious service: Not on file    Active member of club or organization: Not on file    Attends meetings of clubs or organizations: Not on file    Relationship status: Not on file   . Intimate partner violence:    Fear of current or ex partner: Not on file    Emotionally abused: Not on file    Physically abused: Not on file    Forced sexual activity: Not on file  Other Topics Concern  . Not on file  Social History Narrative  . Not on file   Environmental History: The patient lives in a house built in 1997 with carpeting in the bedroom and central air/heat.  She is a non-smoker without pets.  There is no known mold/water damage in the home.  Allergies as of 04/13/2018      Reactions   Cephalexin Diarrhea   Ciprofloxacin Diarrhea   Sulfa Antibiotics Other (See Comments)   Stomach cramps and diarrhea      Medication List        Accurate as of 04/13/18  7:39 PM. Always use your most recent med list.          albuterol 108 (90 Base) MCG/ACT inhaler Commonly known as:  PROVENTIL HFA;VENTOLIN HFA Inhale 1-2 puffs into the lungs every 4 (four) hours as needed for wheezing or shortness of breath.   aspirin-acetaminophen-caffeine 588-502-77 MG tablet Commonly known as:  EXCEDRIN MIGRAINE Take by mouth as needed.   azelastine 0.05 % ophthalmic solution Commonly known as:  OPTIVAR Place 1 drop into both eyes 2 (two) times daily.   Azelastine HCl 0.15 % Soln 1-2 sprays per nostril 2 times daily as needed   Carbinoxamine Maleate 4 MG Tabs Take 1 tablet (4 mg total) by mouth every 8 (eight) hours as needed.   Estradiol 10 MCG Tabs vaginal tablet Place 1 tablet (10 mcg total) vaginally 2 (two) times a week.   fexofenadine 180 MG tablet Commonly known as:  ALLEGRA Take 180 mg by mouth daily.   ibuprofen 200 MG tablet Commonly known as:  ADVIL,MOTRIN Take 200 mg by mouth every 6 (six) hours as needed.   lidocaine 5 % ointment Commonly known as:  XYLOCAINE Apply small amt to area no more than twice daily   omeprazole 20 MG capsule Commonly known as:  PRILOSEC TAKE 1 CAPSULE BY MOUTH EVERY DAY   solifenacin 5 MG tablet Commonly known as:   VESICARE Take 1 tablet (5 mg total) by mouth as needed.   URIBEL 118 MG Caps Take 1 capsule (118 mg total) by mouth 4 (four) times daily.  Known medication allergies: Allergies  Allergen Reactions  . Cephalexin Diarrhea  . Ciprofloxacin Diarrhea  . Sulfa Antibiotics Other (See Comments)    Stomach cramps and diarrhea    I appreciate the opportunity to take part in Itali's care. Please do not hesitate to contact me with questions.  Sincerely,   R. Edgar Frisk, MD

## 2018-04-13 NOTE — Assessment & Plan Note (Signed)
   Aeroallergen avoidance measures have been discussed and provided in written form.  A prescription has been provided for azelastine nasal spray, 1-2 sprays per nostril 2 times daily as needed. Proper nasal spray technique has been discussed and demonstrated.   Nasal saline spray (i.e., Simply Saline) or nasal saline lavage (i.e., NeilMed) is recommended as needed and prior to medicated nasal sprays.  A prescription has been provided for carbinoxamine 4 mg every 8 hours if needed

## 2018-04-13 NOTE — Assessment & Plan Note (Signed)
   Treatment plan as outlined above for allergic rhinitis.  A prescription has been provided for Optivar eyedrops, 1 drop per eye twice daily if needed.

## 2018-04-13 NOTE — Patient Instructions (Addendum)
Perennial allergic rhinitis with a predominantly nonallergic component  Aeroallergen avoidance measures have been discussed and provided in written form.  A prescription has been provided for azelastine nasal spray, 1-2 sprays per nostril 2 times daily as needed. Proper nasal spray technique has been discussed and demonstrated.   Nasal saline spray (i.e., Simply Saline) or nasal saline lavage (i.e., NeilMed) is recommended as needed and prior to medicated nasal sprays.  A prescription has been provided for carbinoxamine 4 mg every 8 hours if needed  Dyspnea The patients sensation of air-hunger, not being able to get a full breath on inspiration, which may eventually be relieved by a yawn suggests sighing dyspnea. Other less likely etiologies include vocal cord dysfunction and asthma. Spirometry today was normal.  Diaphragmatic breathing, or belly breathing, has been discussed with the patient as this technique often times relieves sighing dyspnea.  As a therapeutic trial, a prescription has been provided for albuterol HFA, 1 to 2 inhalations every 4-6 hours if needed.  The patient is to monitor for symptom relief, or lack thereof, from this medication.  The patients subjective and objective measures a pulmonary function will be followed and the treatment plan will be adjusted accordingly. We will recheck spirometry on the next visit.  Allergic conjunctivitis  Treatment plan as outlined above for allergic rhinitis.  A prescription has been provided for Optivar eyedrops, 1 drop per eye twice daily if needed.   Return in about 3 months (around 07/13/2018), or if symptoms worsen or fail to improve.  Control of Mold Allergen  Mold and fungi can grow on a variety of surfaces provided certain temperature and moisture conditions exist.  Outdoor molds grow on plants, decaying vegetation and soil.  The major outdoor mold, Alternaria and Cladosporium, are found in very high numbers during hot and  dry conditions.  Generally, a late Summer - Fall peak is seen for common outdoor fungal spores.  Rain will temporarily lower outdoor mold spore count, but counts rise rapidly when the rainy period ends.  The most important indoor molds are Aspergillus and Penicillium.  Dark, humid and poorly ventilated basements are ideal sites for mold growth.  The next most common sites of mold growth are the bathroom and the kitchen.  Outdoor [employer] 1. Use air conditioning and keep windows closed 2. Avoid exposure to decaying vegetation. 3. Avoid leaf raking. 4. Avoid grain handling. 5. Consider wearing a face mask if working in moldy areas.  Indoor Mold Control 1. Maintain humidity below 50%. 2. Clean washable surfaces with 5% bleach solution. 3. Remove sources e.g. Contaminated carpets.

## 2018-04-21 ENCOUNTER — Encounter: Payer: Self-pay | Admitting: *Deleted

## 2018-04-21 NOTE — Telephone Encounter (Signed)
Received results from genetic testing. Results sent to medical records for scanning and copy ready for Dr. Cathren Laine review.   RESULT: NEGATIVE

## 2018-04-21 NOTE — Telephone Encounter (Signed)
Already done, thanks

## 2018-04-21 NOTE — Telephone Encounter (Signed)
Please let patient know her genetic testing results were negative

## 2018-04-23 DIAGNOSIS — H442A3 Degenerative myopia with choroidal neovascularization, bilateral eye: Secondary | ICD-10-CM | POA: Diagnosis not present

## 2018-04-23 DIAGNOSIS — Z9889 Other specified postprocedural states: Secondary | ICD-10-CM | POA: Diagnosis not present

## 2018-04-23 DIAGNOSIS — Z961 Presence of intraocular lens: Secondary | ICD-10-CM | POA: Diagnosis not present

## 2018-04-28 ENCOUNTER — Telehealth: Payer: Self-pay | Admitting: Neurology

## 2018-04-28 NOTE — Telephone Encounter (Signed)
[facility] genetic testing for 109 genes associated with genetic disorders was negative/normal. Lauren Osborne news, thanks

## 2018-04-30 DIAGNOSIS — N390 Urinary tract infection, site not specified: Secondary | ICD-10-CM | POA: Diagnosis not present

## 2018-05-01 DIAGNOSIS — M62838 Other muscle spasm: Secondary | ICD-10-CM | POA: Diagnosis not present

## 2018-05-01 DIAGNOSIS — M6289 Other specified disorders of muscle: Secondary | ICD-10-CM | POA: Diagnosis not present

## 2018-05-01 DIAGNOSIS — N3020 Other chronic cystitis without hematuria: Secondary | ICD-10-CM | POA: Diagnosis not present

## 2018-05-04 DIAGNOSIS — M62838 Other muscle spasm: Secondary | ICD-10-CM | POA: Diagnosis not present

## 2018-05-04 DIAGNOSIS — M6289 Other specified disorders of muscle: Secondary | ICD-10-CM | POA: Diagnosis not present

## 2018-05-04 DIAGNOSIS — R300 Dysuria: Secondary | ICD-10-CM | POA: Diagnosis not present

## 2018-06-13 ENCOUNTER — Other Ambulatory Visit: Payer: Self-pay | Admitting: Allergy and Immunology

## 2018-10-12 ENCOUNTER — Telehealth: Payer: Self-pay | Admitting: Neurology

## 2018-10-12 ENCOUNTER — Other Ambulatory Visit: Payer: Self-pay | Admitting: Neurology

## 2018-10-12 DIAGNOSIS — R29898 Other symptoms and signs involving the musculoskeletal system: Secondary | ICD-10-CM

## 2018-10-12 NOTE — Telephone Encounter (Signed)
That's fine, I placed the order thanks

## 2018-10-12 NOTE — Telephone Encounter (Signed)
Pt is calling in wanting to know if a referral can be placed for Physical Therapy for her legs, to regain balance and strength  CB# [phone] or [phone]

## 2018-10-12 NOTE — Progress Notes (Signed)
phy

## 2018-10-12 NOTE — Telephone Encounter (Signed)
Spoke with pt and advised referral was sent for PT at outpatient neuro rehab next door. Pt given number to call if she does not hear from them within 1 week. She verbalized appreciation.

## 2018-11-02 NOTE — Telephone Encounter (Signed)
Per pt's mychart message, she called PT after 2 weeks and was told they didn't have the referral. I advised pt in mychart that the referral was sent on 10/13/2018 and that we would look into this.

## 2018-11-02 NOTE — Telephone Encounter (Signed)
Called and spoke  To [facility] door and relayed that patient had not been scheduled yet. Lauren Osborne stated that she would call patient today and get her scheduled.   Called and told patient as well . Patient will call call me back if she is not scheduled buy end of business day tommorw Thanks [name]

## 2018-11-19 ENCOUNTER — Ambulatory Visit: Payer: BC Managed Care – PPO | Attending: Neurology | Admitting: Rehabilitative and Restorative Service Providers"

## 2018-11-19 ENCOUNTER — Encounter: Payer: Self-pay | Admitting: Rehabilitative and Restorative Service Providers"

## 2018-11-19 ENCOUNTER — Other Ambulatory Visit: Payer: Self-pay

## 2018-11-19 DIAGNOSIS — M6281 Muscle weakness (generalized): Secondary | ICD-10-CM | POA: Diagnosis present

## 2018-11-19 DIAGNOSIS — R2689 Other abnormalities of gait and mobility: Secondary | ICD-10-CM

## 2018-11-19 NOTE — Therapy (Signed)
[facility] [address] [place], [name], [address] Phone: [phone]   Fax:  [phone]  Physical Therapy Evaluation  Patient Details  Name: Lauren Osborne MRN: [id number] Date of Birth: [birth year] Referring Provider (PT): Heide Spark, MD  CLINIC OPERATION CHANGES: Outpatient [facility] is open at lower capacity following universal masking, social distancing, and patient screening.  The patient's COVID risk of complications score is 1.   Encounter Date: 11/19/2018  PT End of Session - 11/19/18 0909    Visit Number  1    Number of Visits  13    Date for PT Re-Evaluation  01/18/19    Authorization Type  state BCBS/ aquatics included    PT Start Time  0805    PT Stop Time  0850    PT Time Calculation (min)  45 min    Activity Tolerance  Patient tolerated treatment well    Behavior During Therapy  [facility] for tasks assessed/performed       Past Medical History:  Diagnosis Date  . Abnormal Pap smear of cervix 12/2009   ASCUS cannot exclude HGSIL.  [name] Arthralgia of right hip 2015   Hip replacement   . Cystocele or rectocele with uterine prolapse   . Headache   . IBS (irritable bowel syndrome)   . Osteoarthritis   . Rectocele   . 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 From [facility]     Past Surgical History:  Procedure Laterality Date  . BUNIONECTOMY Right   . COLPOSCOPY  12/2009   negative  . FOOT SURGERY Right    bone removed   . hardward removal from right foot  12/15   Dr. Wardell Honour  . JOINT REPLACEMENT Right 2001   Hip replacement   . Posterior vaginal repair  10/21/2012   Procedure: VAGINAL REPAIR POSTERIOR; Surgeon: Daneil Dolin, MD; Location: [place]; Service: Gynecology;;  . SINOSCOPY    . TONSILLECTOMY    . UTERINE SUSPENSION  10/21/12   Procedure: UTEROSACRAL SUSPENSION; Surgeon: Daneil Dolin, MD; Location: [facility] MAIN OR;  Service: Gynecology;;  . VAGINAL HYSTERECTOMY  10/21/12   Procedure: HYSTERECTOMY VAGINAL; Surgeon: Daneil Dolin, MD; Location: [place]; Service: Gynecology; Laterality: N/A; bilateral salpingectomy    There were no vitals filed for this visit.   Subjective Assessment - 11/19/18 0806    Subjective  The patient reports progressive weakness of legs with intermittent stinging in distal LEs.  She reports ankle instability with walking and drop foot that comes and goes.  She has been through testing for arthritis and had MRIs without understanding what is causing weakness.  She reports "sometimes my legs feel tight and full."  She is wanting to find better shoewear.    Pertinent History  rogressive leg weakness,ataxia, imbalance, numbness, slowly progressive, distal atrophy in feet, high arches, hammer toes    Patient Stated Goals  To get range of motion in feet and ankles to do squats and lunges.  Dream goals are to be able to jump rope. 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    Currently in Pain?  Yes    Pain Score  --   "it's not painful, it is heaviness."   Pain Radiating Towards  feels like legs are in cement blocks    Aggravating Factors   walking feels like I'm lifting cement blocks  Pain Relieving Factors  unsure    Effect of Pain on Daily Activities  Can be standing and get a squeezing sensation in her distal legs.         [facility] PT Assessment - 11/19/18 0816      Assessment   Medical Diagnosis  bilateral LE weakness    Referring Provider (PT)  Heide Spark, MD    Onset Date/Surgical Date  10/12/18    Prior Therapy  none      Precautions   Precautions  Fall      Restrictions   Weight Bearing Restrictions  No      Balance Screen   Has the patient fallen in the past 6 months  Yes    How many times?  1 x per year on average    Has the patient had a decrease in activity level because of a fear of falling?    No    Is the patient reluctant to leave their home because of a fear of falling?   No      Home Environment   Living Environment  Private residence    Living Arrangements  Spouse/significant other    Type of [place] to enter    Entrance Stairs-Number of Steps  2    [place]  Two level    Alternate Level Stairs-Rails  Can reach both      Prior Function   Level of Independence  Independent    Vocation  Full time employment   teaching online   Leisure  [place] 1/2 mile 2-3 days/week.      Cognition   Overall Cognitive Status  Within Functional Limits for tasks assessed      Sensation   Additional Comments  Notes visual decrease in L central vision.      Posture/Postural Control   Posture/Postural Control  Postural limitations    Postural Limitations  Rounded Shoulders;Forward head    Posture Comments  Observed foot posture without shoes/socks with high arches noted.  R great toe abducts to cross over 2nd toe.  Although the patient has high arches that remain in weight bearing, during gait with shoes donned, she has pronation at feet.  *Recommended she see [employer] Baxter Flattery, PT (owner) to help with proper shoewear.       ROM / Strength   AROM / PROM / Strength  AROM;Strength      AROM   Overall AROM Comments  During R ankle DF, (has h/o 3 surgeries on R foot bunionectomy, removal of pins and removal of sesmoid bones), she has R great toe abduction crossing over 2nd toe.  This feels like a "spasm" for her.      AROM Assessment Site  Ankle    Right/Left Ankle  Right;Left    Right Ankle Dorsiflexion  6    Left Ankle Dorsiflexion  4      Strength   Overall Strength Comments  5/5 UE strength for shoulder flexion/abduction, elbow flexion/extension.  5/5 hip flexion, 5/5 knee flexion/extension and ankle DF.    Bilateral hip abduction is 3/5 *tested after observing trendelenberg gait*, bilat hip extension is 4/5.        Palpation   Palpation comment   soreness in lateral ankles wiht palpation "tenderness like fibromyalgia pain"      Transfers   Transfers  Sit to Stand;Stand to Sit    Sit to Stand  7: Independent  Stand to Sit  7: Independent      Ambulation/Gait   Ambulation/Gait  Yes    Ambulation/Gait Assistance  7: Independent    Ambulation Distance (Feet)  200 Feet    Assistive device  None    Gait Pattern  Trendelenburg;Lateral hip instability   dec'd eccentric control ant tib/ foot slap bilat   Gait velocity  3.91 ft/sec    Stairs  Yes    Stairs Assistance  6: Modified independent (Device/Increase time)    Stair Management Technique  One rail Right;Alternating pattern    Number of Stairs  8      Standardized Balance Assessment   Standardized Balance Assessment  Berg Balance Test      Berg Balance Test   Sit to Stand  Able to stand without using hands and stabilize independently    Standing Unsupported  Able to stand safely 2 minutes    Sitting with Back Unsupported but Feet Supported on Floor or Stool  Able to sit safely and securely 2 minutes    Stand to Sit  Sits safely with minimal use of hands    Transfers  Able to transfer safely, minor use of hands    Standing Unsupported with Eyes Closed  Able to stand 10 seconds safely    Standing Unsupported with Feet Together  Able to place feet together independently and stand 1 minute safely    From Standing, Reach Forward with Outstretched Arm  Can reach confidently >25 cm (10")    From Standing Position, Pick up Object from Floor  Able to pick up shoe safely and easily    From Standing Position, Turn to Look Behind Over each Shoulder  Looks behind from both sides and weight shifts well    Turn 360 Degrees  Able to turn 360 degrees safely in 4 seconds or less    Standing Unsupported, Alternately Place Feet on Step/Stool  Able to stand independently and safely and complete 8 steps in 20 seconds    Standing Unsupported, One Foot in Front  Able to take small step independently  and hold 30 seconds    Standing on One Leg  Able to lift leg independently and hold equal to or more than 3 seconds    Total Score  52    Berg comment:  52/56                Objective measurements completed on examination: See above findings.              PT Education - 11/19/18 0849    Education Details  HEP    Person(s) Educated  Patient    Methods  Explanation;Demonstration;Handout   by email   Comprehension  Verbalized understanding;Returned demonstration       PT Short Term Goals - 11/19/18 0914      PT SHORT TERM GOAL #1   Title  The patient will be independent with initial HEP for ankle strengthening, hip strengthening, postural strengthening/core and LE stretching.    Time  4    Period  Weeks    Target Date  12/19/18      PT SHORT TERM GOAL #2   Title  The patient will improve ankle DF up to 10 degrees AROM.    Baseline  4 degrees L ankle DF and 6 degrees R ankle DF.    Time  4    Period  Weeks    Target Date  12/19/18  PT SHORT TERM GOAL #3   Title  The patient will maintain single leg stance x 8 seconds bilaterally to demo improved LE motor control/ankle stability.    Time  4    Period  Weeks    Target Date  12/19/18      PT SHORT TERM GOAL #4   Title  The patient will have 4/5 bilateral hip abduction strength to contribute to improved lateral hip stability during gait.    Time  4    Period  Weeks    Target Date  12/19/18        PT Long Term Goals - 11/19/18 0916      PT LONG TERM GOAL #1   Title  The patient will return demo community exercise program for post d/c (to potentially include yoga with adriene foot emphasis, aquatics, gym routine depending on re-opening).    Time  8    Period  Weeks    Target Date  01/18/19      PT LONG TERM GOAL #2   Title  The patient will maintain tandem stance x 30 seconds with each foot posterior in order to demo improved lateral ankle and hip stability.    Time  8    Period  Weeks     Target Date  01/18/19      PT LONG TERM GOAL #3   Title  The patient will report walking 1 mile for exercise to demonstrate improved tolerance to extended gait.    Baseline  Walks 1/2 mile/ notes cramping and heaviness.    Time  8    Period  Weeks    Target Date  01/18/19             Plan - 11/19/18 0851    Clinical Impression Statement  The patient is a 63 yo female presenting to outpatient physical therapy with impairments including:  decreased hip abduction strength, decreased hip extension strength, decreased ankle DF AROM, decreased eccentric control of anterior tibialis musculature with gait, pronation of feet with ambulation, abnormal foot posture, weakness observed in core musculature during mobility, decreased balance, and an overall decrease in confidence with activity. The patient has also stopped participating in community exercise as she was doing prior to the past 2 years, which may be increasing her perception of weakness.  PT initiated HEP today for heel cord/gastrocs stretching and hip abduction strengthening.  Plan to further develop HEP, work on high level balance/strengthening in clinic and consider aquatics x 2 sessions as patient used to enjoy swimming and this would be an excellent option for community exercise (since gyms are currently not open, however offering swim lanes and [facility] open).    Personal Factors and Comorbidities  Comorbidity 1    Comorbidities  hip arthritis    Examination-Activity Limitations  Locomotion Level    Examination-Participation Restrictions  Community Activity    Stability/Clinical Decision Making  Stable/Uncomplicated    Clinical Decision Making  Low    Rehab Potential  Good    PT Frequency  2x / week   + eval   PT Duration  8 weeks    PT Treatment/Interventions  Aquatic Therapy;ADLs/Self Care Home Management;Neuromuscular re-education;Patient/family education;Gait training;Stair training;Functional mobility training;Therapeutic  activities;Therapeutic exercise;Balance training;Manual techniques    PT Next Visit Plan  Check initial HEP, further add HEP including hip extension, eccentric anterior tibialis exercise.  Add balance activities of tandem and single leg stance for HEP if able.  Work in clinic on further hip  abduction strengthening, foot stretching, foot strengthening activities.    Consulted and Agree with Plan of Care  Patient       Patient will benefit from skilled therapeutic intervention in order to improve the following deficits and impairments:  Decreased strength, Decreased mobility, Abnormal gait, Decreased activity tolerance, Impaired flexibility, Decreased range of motion  Visit Diagnosis: 1. Muscle weakness (generalized)   2. Other abnormalities of gait and mobility        Problem List Patient Active Problem List   Diagnosis Date Noted  . Perennial allergic rhinitis with a predominantly nonallergic component 04/13/2018  . Dyspnea 04/13/2018  . Allergic conjunctivitis 04/13/2018  . GERD (gastroesophageal reflux disease) 11/28/2017  . 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 Urinary frequency 07/07/2015  . Rectal pressure 07/07/2015  . Arthralgia of hip 03/22/2014  . Arthralgia, sacroiliac 03/22/2014  . ANA positive 03/10/2014  . 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 Uterovaginal prolapse 09/17/2012    WEAVER,CHRISTINA, PT 11/19/2018, 9:25 AM  [facility] [address] [place], [name], [address] Phone: [phone]   Fax:  [phone]  Name: Lauren Osborne MRN: [id number] Date of Birth: Jan 23, 1956

## 2018-11-19 NOTE — Patient Instructions (Signed)
Access Code: [email or web address]  URL: [email or web address]  Date: 11/19/2018  Prepared by: Rudell Cobb   Exercises Gastroc Stretch on Wall - 3 reps - 1 sets - 30 seconds hold - 2x daily - 7x weekly Sidelying Hip Abduction - 12-15 reps - 1 sets - 3 hold - 2x daily - 7x weekly

## 2018-12-04 ENCOUNTER — Ambulatory Visit: Payer: BC Managed Care – PPO | Admitting: Rehabilitative and Restorative Service Providers"

## 2018-12-04 ENCOUNTER — Other Ambulatory Visit: Payer: Self-pay

## 2018-12-04 ENCOUNTER — Encounter: Payer: Self-pay | Admitting: Rehabilitative and Restorative Service Providers"

## 2018-12-04 DIAGNOSIS — R2689 Other abnormalities of gait and mobility: Secondary | ICD-10-CM

## 2018-12-04 DIAGNOSIS — M6281 Muscle weakness (generalized): Secondary | ICD-10-CM | POA: Diagnosis not present

## 2018-12-04 NOTE — Patient Instructions (Signed)
Access Code: [email or web address]  URL: [email or web address]  Date: 12/04/2018  Prepared by: Rudell Cobb   Exercises Gastroc Stretch on Wall - 3 reps - 1 sets - 30 seconds hold - 2x daily - 7x weekly Sidelying Hip Abduction - 12-15 reps - 1 sets - 3 hold - 2x daily - 7x weekly Supine Dead Bug with Leg Extension - 10 reps - 1 sets - 2x daily - 7x weekly Ankle Eversion with Resistance - 10 reps - 1 sets - 2x daily - 7x weekly Supine ITB Stretch - 3 reps - 1 sets - 30 seconds hold - 2x daily - 7x weekly

## 2018-12-04 NOTE — Therapy (Signed)
[facility] [address] [place], [name], [address] Phone: [phone]   Fax:  [phone]  Physical Therapy Treatment  Patient Details  Name: Lauren Osborne MRN: [id number] Date of Birth: Mar 07, 1956 Referring Provider (PT): Heide Spark, MD  CLINIC OPERATION CHANGES: Outpatient Neuro Rehab is open at lower capacity following universal masking, social distancing, and patient screening.  The patient's COVID risk of complications score is 1.  Encounter Date: 12/04/2018  PT End of Session - 12/04/18 1003    Visit Number  2    Number of Visits  13    Date for PT Re-Evaluation  01/18/19    Authorization Type  state BCBS/ aquatics included    PT Start Time  1004    PT Stop Time  1050    PT Time Calculation (min)  46 min    Activity Tolerance  Patient tolerated treatment well    Behavior During Therapy  WFL for tasks assessed/performed       Past Medical History:  Diagnosis Date  . Abnormal Pap smear of cervix 12/2009   ASCUS cannot exclude HGSIL.  [name] Arthralgia of right hip 2015   Hip replacement   . Cystocele or rectocele with uterine prolapse   . Headache   . IBS (irritable bowel syndrome)   . Osteoarthritis   . Rectocele   . 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 From [facility]     Past Surgical History:  Procedure Laterality Date  . BUNIONECTOMY Right   . COLPOSCOPY  12/2009   negative  . FOOT SURGERY Right    bone removed   . hardward removal from right foot  12/15   Dr. Wardell Honour  . JOINT REPLACEMENT Right 2001   Hip replacement   . Posterior vaginal repair  10/21/2012   Procedure: VAGINAL REPAIR POSTERIOR; Surgeon: Daneil Dolin, MD; Location: [facility]; Service: Gynecology;;  . SINOSCOPY    . TONSILLECTOMY    . UTERINE SUSPENSION  10/21/12   Procedure: UTEROSACRAL SUSPENSION; Surgeon: Daneil Dolin, MD; Location: [facility] MA[facility] OR;  Service: Gynecology;;  . VAGINAL HYSTERECTOMY  10/21/12   Procedure: HYSTERECTOMY VAGINAL; Surgeon: Daneil Dolin, MD; Location: [facility]; Service: Gynecology; Laterality: N/A; bilateral salpingectomy    There were no vitals filed for this visit.  Subjective Assessment - 12/04/18 1007    Subjective  The patient is doing HEP regularly and feels the gastrocs stretch is helping the foot drop already.  She has some soreness in IT band region.    Pertinent History  Progressive leg weakness,ataxia, imbalance, numbness, slowly progressive, distal atrophy in feet, high arches, hammer toes    Patient Stated Goals  To get range of motion in feet and ankles to do squats and lunges.  Dream goals are to be able to jump rope. 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    Currently in Pain?  --   "just the usual                      [facility] Adult PT Treatment/Exercise - 12/04/18 1015      Self-Care   Self-Care  Other Self-Care Comments    Other Self-Care Comments   Discussed rolling IT band with pastry roller (demonstrated with foam roller in clinic) to reduce tightness.       Exercises   Exercises  Other Exercises    Other Exercises   Prone hip extension x 10 reps R and L sides, Quadriped attempting hip extension (after education for correct positioning in q-ped), attempted hip extension however painful for wrists.   Reviewed sidelying hip abduction x 10 reps, IT band stretch sidelying, prone PROm quad stretch, supine dead bug (marching with alternating LE/UEs), standing IT band stretch, ankle DF and eversion with red theraband.        Manual Therapy   Manual Therapy  Soft tissue mobilization    Manual therapy comments  sidelying for IT band    Soft tissue mobilization  gentle/superficial STM to reduce pain, improve circulation.             PT Education - 12/04/18 1402    Education Details  Added HEP for IT band  stretch,  supine dead bug, ankle eversion with resistance    Person(s) Educated  Patient    Methods  Explanation;Demonstration;Handout    Comprehension  Verbalized understanding;Returned demonstration       PT Short Term Goals - 11/19/18 0914      PT SHORT TERM GOAL #1   Title  The patient will be independent with initial HEP for ankle strengthening, hip strengthening, postural strengthening/core and LE stretching.    Time  4    Period  Weeks    Target Date  12/19/18      PT SHORT TERM GOAL #2   Title  The patient will improve ankle DF up to 10 degrees AROM.    Baseline  4 degrees L ankle DF and 6 degrees R ankle DF.    Time  4    Period  Weeks    Target Date  12/19/18      PT SHORT TERM GOAL #3   Title  The patient will maintain single leg stance x 8 seconds bilaterally to demo improved LE motor control/ankle stability.    Time  4    Period  Weeks    Target Date  12/19/18      PT SHORT TERM GOAL #4   Title  The patient will have 4/5 bilateral hip abduction strength to contribute to improved lateral hip stability during gait.    Time  4    Period  Weeks    Target Date  12/19/18        PT Long Term Goals - 11/19/18 0916      PT LONG TERM GOAL #1   Title  The patient will return demo community exercise program for post d/c (to potentially include yoga with adriene foot emphasis, aquatics, gym routine depending on re-opening).    Time  8    Period  Weeks    Target Date  01/18/19      PT LONG TERM GOAL #2   Title  The patient will maintain tandem stance x 30 seconds with each foot posterior in order to demo improved lateral ankle and hip stability.    Time  8    Period  Weeks    Target Date  01/18/19      PT LONG TERM GOAL #3   Title  The patient will report walking 1 mile for exercise to demonstrate improved tolerance to extended gait.    Baseline  Walks 1/2 mile/ notes cramping and heaviness.    Time  8    Period  Weeks    Target Date  01/18/19  Plan - 12/04/18 1409    Clinical Impression Statement  The patient tolerated LE strengthening today and additions to HEP to emphasize ankle strength, hip strength and IT band stretching.  PT to continue developing HEP and focusing on return to long term wellness routine.    PT Treatment/Interventions  Aquatic Therapy;ADLs/Self Care Home Management;Neuromuscular re-education;Patient/family education;Gait training;Stair training;Functional mobility training;Therapeutic activities;Therapeutic exercise;Balance training;Manual techniques    PT Next Visit Plan  Check HEP; add balance activities of tandem/SLS for HEP.  Strengthening hips, foot stretching, strenthening LEs and UE wall presses.    Consulted and Agree with Plan of Care  Patient       Patient will benefit from skilled therapeutic intervention in order to improve the following deficits and impairments:  Decreased strength, Decreased mobility, Abnormal gait, Decreased activity tolerance, Impaired flexibility, Decreased range of motion  Visit Diagnosis: 1. Muscle weakness (generalized)   2. Other abnormalities of gait and mobility        Problem List Patient Active Problem List   Diagnosis Date Noted  . Perennial allergic rhinitis with a predominantly nonallergic component 04/13/2018  . Dyspnea 04/13/2018  . Allergic conjunctivitis 04/13/2018  . GERD (gastroesophageal reflux disease) 11/28/2017  . 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 Urinary frequency 07/07/2015  . Rectal pressure 07/07/2015  . Arthralgia of hip 03/22/2014  . Arthralgia, sacroiliac 03/22/2014  . ANA positive 03/10/2014  . 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 Uterovaginal prolapse 09/17/2012    WEAVER,CHRISTINA, PT 12/04/2018, 2:10 PM  [facility] [address] [place], [name], [address] Phone: [phone]   Fax:  [phone]  Name: Lauren Osborne MRN: [id number] Date  of Birth: Nov 30, 1955

## 2018-12-09 ENCOUNTER — Ambulatory Visit: Payer: BC Managed Care – PPO | Admitting: Rehabilitative and Restorative Service Providers"

## 2018-12-09 ENCOUNTER — Encounter: Payer: Self-pay | Admitting: Rehabilitative and Restorative Service Providers"

## 2018-12-09 ENCOUNTER — Other Ambulatory Visit: Payer: Self-pay

## 2018-12-09 DIAGNOSIS — M6281 Muscle weakness (generalized): Secondary | ICD-10-CM

## 2018-12-09 DIAGNOSIS — R2689 Other abnormalities of gait and mobility: Secondary | ICD-10-CM

## 2018-12-09 NOTE — Patient Instructions (Signed)
Access Code: [email or web address]  URL: [email or web address]  Date: 12/09/2018  Prepared by: Rudell Cobb   Exercises Sidelying Hip Abduction - 12-15 reps - 1 sets - 3 hold - 2x daily - 7x weekly Supine Dead Bug with Leg Extension - 10 reps - 1 sets - 2x daily - 7x weekly Gastroc Stretch on Wall - 3 reps - 1 sets - 30 seconds hold - 2x daily - 7x weekly Standing ITB Stretch - 3 reps - 1 sets - 20-30 seconds hold - 2x daily - 7x weekly Tandem Stance with Support - 3 reps - 1 sets - 30 seconds hold - 2x daily - 7x weekly Standing Single Leg Stance with Unilateral Counter Support - 3 reps - 1 sets - 20 seconds hold - 2x daily - 7x weekly

## 2018-12-09 NOTE — Therapy (Signed)
[name], [name], [address] Phone: [phone]   Fax:  [phone]  Physical Therapy Treatment  Patient Details  Name: Lauren Osborne MRN: [id number] Date of Birth: 1956/02/05 Referring Provider (PT): Heide Spark, MD  CLINIC OPERATION CHANGES: [facility] is open at lower capacity following universal masking, social distancing, and patient screening.  The patient's COVID risk of complications score is 1.   Encounter Date: 12/09/2018  PT End of Session - 12/09/18 1105    Visit Number  3    Number of Visits  13    Date for PT Re-Evaluation  01/18/19    Authorization Type  state BCBS/ aquatics included    PT Start Time  1105    PT Stop Time  1145    PT Time Calculation (min)  40 min    Activity Tolerance  Patient tolerated treatment well    Behavior During Therapy  WFL for tasks assessed/performed       Past Medical History:  Diagnosis Date  . Abnormal Pap smear of cervix 12/2009   ASCUS cannot exclude HGSIL.  [name] Arthralgia of right hip 2015   Hip replacement   . Cystocele or rectocele with uterine prolapse   . Headache   . IBS (irritable bowel syndrome)   . Osteoarthritis   . Rectocele   . 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 From [facility]     Past Surgical History:  Procedure Laterality Date  . BUNIONECTOMY Right   . COLPOSCOPY  12/2009   negative  . FOOT SURGERY Right    bone removed   . hardward removal from right foot  12/15   Dr. Wardell Honour  . JOINT REPLACEMENT Right 2001   Hip replacement   . Posterior vaginal repair  10/21/2012   Procedure: VAGINAL REPAIR POSTERIOR; Surgeon: Daneil Dolin, MD; Location: [facility]; Service: Gynecology;;  . SINOSCOPY    . TONSILLECTOMY    . UTERINE SUSPENSION  10/21/12   Procedure: UTEROSACRAL SUSPENSION; Surgeon: Daneil Dolin, MD; Location: [facility] MAIN OR;  Service: Gynecology;;  . VAGINAL HYSTERECTOMY  10/21/12   Procedure: HYSTERECTOMY VAGINAL; Surgeon: Daneil Dolin, MD; Location: [facility]; Service: Gynecology; Laterality: N/A; bilateral salpingectomy    There were no vitals filed for this visit.  Subjective Assessment - 12/09/18 1106    Subjective  The patient has a cuple of questions about home exercises.  She notes imbalance on unlevel surfaces.    Pertinent History  Progressive leg weakness,ataxia, imbalance, numbness, slowly progressive, distal atrophy in feet, high arches, hammer toes    Patient Stated Goals  To get range of motion in feet and ankles to do squats and lunges.  Dream goals are to be able to jump rope. 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    Currently in Pain?  Yes    Pain Score  --   mild   Pain Location  Neck                       [facility] Adult PT Treatment/Exercise - 12/09/18 1156      Ambulation/Gait   Ambulation/Gait  Yes    Ambulation/Gait Assistance  7: Independent    Ambulation Distance (Feet)  400 Feet    Assistive device  None    Gait Pattern  Trendelenburg;Lateral hip instability  Gait Comments  Emphasized arm swing and larger amplitude movement.  Cues for upright posture and dec'd anterior leaning during gait.      Self-Care   Self-Care  Other Self-Care Comments    Other Self-Care Comments   Patient and PT discussed barriers to walking program and reasons her mobility has decreased.  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  She also notes if she can't exercise as she used to (ie. jogging), then a less intense version is not as good (ie. walking).    PT and patient discussed beginning activity at an appropriate level and recognizing it has been years since prior activity level.  Breaking her goals into steps will help her see her progress along the way.      Neuro Re-ed    Neuro Re-ed Details   Added HEP  for balance including tandem stance in corner with cues for visual fixation and single leg stance near support surface for balance.  Also performed corner foam + eyes closed x 30 seconds with minimal sway (did inot add this to HEP).      Exercises   Exercises  Other Exercises    Other Exercises   Reviewed prior HEP of sidelying hip abduction, standing heel cord stretch, supine "dead bug".  Patient did not feel IT band stretch, therefore modified after trying supine with strap and standing-- added standing to HEP.  Also d/c ankle exercise of seated eversion and added single leg stance and tandem stance.               PT Education - 12/09/18 1155    Education Details  Updated HEP-- see printout medbridge    Person(s) Educated  Patient    Methods  Explanation;Demonstration;Handout    Comprehension  Verbalized understanding;Returned demonstration;Verbal cues required       PT Short Term Goals - 11/19/18 0914      PT SHORT TERM GOAL #1   Title  The patient will be independent with initial HEP for ankle strengthening, hip strengthening, postural strengthening/core and LE stretching.    Time  4    Period  Weeks    Target Date  12/19/18      PT SHORT TERM GOAL #2   Title  The patient will improve ankle DF up to 10 degrees AROM.    Baseline  4 degrees L ankle DF and 6 degrees R ankle DF.    Time  4    Period  Weeks    Target Date  12/19/18      PT SHORT TERM GOAL #3   Title  The patient will maintain single leg stance x 8 seconds bilaterally to demo improved LE motor control/ankle stability.    Time  4    Period  Weeks    Target Date  12/19/18      PT SHORT TERM GOAL #4   Title  The patient will have 4/5 bilateral hip abduction strength to contribute to improved lateral hip stability during gait.    Time  4    Period  Weeks    Target Date  12/19/18        PT Long Term Goals - 11/19/18 0916      PT LONG TERM GOAL #1   Title  The patient will return demo community exercise  program for post d/c (to potentially include yoga with adriene foot emphasis, aquatics, gym routine depending on re-opening).    Time  8    Period  Weeks    Target Date  01/18/19      PT LONG TERM GOAL #2   Title  The patient will maintain tandem stance x 30 seconds with each foot posterior in order to demo improved lateral ankle and hip stability.    Time  8    Period  Weeks    Target Date  01/18/19      PT LONG TERM GOAL #3   Title  The patient will report walking 1 mile for exercise to demonstrate improved tolerance to extended gait.    Baseline  Walks 1/2 mile/ notes cramping and heaviness.    Time  8    Period  Weeks    Target Date  01/18/19            Plan - 12/09/18 1459    Clinical Impression Statement  PT condensed and updated HEP with two activities for stretching (heel cord and IT band), 2 for balance, and 2 for LE strengthening.  We discussed that balance activities also emphasize stability at ankles.  PT to continue working to [employer] increased activity to tolerance.    PT Treatment/Interventions  Aquatic Therapy;ADLs/Self Care Home Management;Neuromuscular re-education;Patient/family education;Gait training;Stair training;Functional mobility training;Therapeutic activities;Therapeutic exercise;Balance training;Manual techniques    PT Next Visit Plan  Inquire about HEP performance to ensure no questions.  Standing balance/ dynamic activities including rocker board, compliant surfaces and obstacle courses.  UE wall press and quadriped to engage UEs with postural cues.  Working on arm swing and posture with gait.    PT Home Exercise Plan  [id number]    Consulted and Agree with Plan of Care  Patient       Patient will benefit from skilled therapeutic intervention in order to improve the following deficits and impairments:  Decreased strength, Decreased mobility, Abnormal gait, Decreased activity tolerance, Impaired flexibility, Decreased range of motion  Visit  Diagnosis: 1. Other abnormalities of gait and mobility   2. Muscle weakness (generalized)        Problem List Patient Active Problem List   Diagnosis Date Noted  . Perennial allergic rhinitis with a predominantly nonallergic component 04/13/2018  . Dyspnea 04/13/2018  . Allergic conjunctivitis 04/13/2018  . GERD (gastroesophageal reflux disease) 11/28/2017  . 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 Urinary frequency 07/07/2015  . Rectal pressure 07/07/2015  . Arthralgia of hip 03/22/2014  . Arthralgia, sacroiliac 03/22/2014  . ANA positive 03/10/2014  . 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 Uterovaginal prolapse 09/17/2012    WEAVER,CHRISTINA, PT 12/09/2018, 3:01 PM  [facility] [address] [place], [name], [address] Phone: [phone]   Fax:  [phone]  Name: DANIQUE HARTSOUGH MRN: [id number] Date of Birth: 26-Feb-1956

## 2018-12-16 ENCOUNTER — Ambulatory Visit: Payer: BC Managed Care – PPO | Admitting: Physical Therapy

## 2018-12-21 ENCOUNTER — Other Ambulatory Visit: Payer: Self-pay

## 2018-12-21 ENCOUNTER — Encounter: Payer: Self-pay | Admitting: Rehabilitative and Restorative Service Providers"

## 2018-12-21 ENCOUNTER — Ambulatory Visit: Payer: BC Managed Care – PPO | Attending: Neurology | Admitting: Rehabilitative and Restorative Service Providers"

## 2018-12-21 DIAGNOSIS — R2689 Other abnormalities of gait and mobility: Secondary | ICD-10-CM | POA: Diagnosis present

## 2018-12-21 DIAGNOSIS — M6281 Muscle weakness (generalized): Secondary | ICD-10-CM | POA: Diagnosis present

## 2018-12-21 NOTE — Therapy (Signed)
[facility] [address] [place], [name], [address] Phone: [phone]   Fax:  [phone]  Physical Therapy Treatment  Patient Details  Name: Lauren Osborne MRN: [id number] Date of Birth: 08-28-1955 Referring Provider (PT): Heide Spark, MD  CLINIC OPERATION CHANGES: [facility] is open at lower capacity following universal masking, social distancing, and patient screening.  The patient's COVID risk of complications score is 1.   Encounter Date: 12/21/2018  PT End of Session - 12/21/18 1644    Visit Number  4    Number of Visits  13    Date for PT Re-Evaluation  01/18/19    Authorization Type  state BCBS/ aquatics included    PT Start Time  1233    PT Stop Time  1315    PT Time Calculation (min)  42 min    Activity Tolerance  Patient tolerated treatment well    Behavior During Therapy  WFL for tasks assessed/performed       Past Medical History:  Diagnosis Date  . Abnormal Pap smear of cervix 12/2009   ASCUS cannot exclude HGSIL.  [name] Arthralgia of right hip 2015   Hip replacement   . Cystocele or rectocele with uterine prolapse   . Headache   . IBS (irritable bowel syndrome)   . Osteoarthritis   . Rectocele   . 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 From [facility]     Past Surgical History:  Procedure Laterality Date  . BUNIONECTOMY Right   . COLPOSCOPY  12/2009   negative  . FOOT SURGERY Right    bone removed   . hardward removal from right foot  12/15   Dr. Wardell Honour  . JOINT REPLACEMENT Right 2001   Hip replacement   . Posterior vaginal repair  10/21/2012   Procedure: VAGINAL REPAIR POSTERIOR; Surgeon: Daneil Dolin, MD; Location: [facility]; Service: Gynecology;;  . SINOSCOPY    . TONSILLECTOMY    . UTERINE SUSPENSION  10/21/12   Procedure: UTEROSACRAL SUSPENSION; Surgeon: Daneil Dolin, MD; Location: [facility] MAIN OR;  Service: Gynecology;;  . VAGINAL HYSTERECTOMY  10/21/12   Procedure: HYSTERECTOMY VAGINAL; Surgeon: Daneil Dolin, MD; Location: [facility]; Service: Gynecology; Laterality: N/A; bilateral salpingectomy    There were no vitals filed for this visit.  Subjective Assessment - 12/21/18 1229    Subjective  "I've been a little lax."  She notes her motivation wasn't great.  She notes difficulty performing IT Band stretch.  She felt exercises were helping-- just needed to keep doing them.  Her vision is impaired on left side.    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    Patient Stated Goals  To get range of motion in feet and ankles to do squats and lunges.  Dream goals are to be able to jump rope. 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    Currently in Pain?  Yes    Pain Location  Neck    Pain Descriptors / Indicators  Sore    Pain Type  Acute pain    Pain Onset  1 to 4 weeks ago    Aggravating Factors   wonders if she is doing exercises correctly.    Pain Relieving Factors  unsure  [facility] Adult PT Treatment/Exercise - 12/21/18 1306      Ambulation/Gait   Ambulation/Gait  Yes    Ambulation/Gait Assistance  7: Independent    Ambulation Distance (Feet)  400 Feet    Assistive device  None    Ambulation Surface  Outdoor;Grass;Indoor;Level;Paved    Gait Comments  Community gait with patient demonstrating a crouched type gait position when ambulating through the grass.        Neuro Re-ed    Neuro Re-ed Details   Single leg stance activities, tandem stance for distal LE ankle stability, standing marching engaging core musculature.        Exercises   Exercises  Other Exercises    Other Exercises   hip extension x 10 reps, quadriped lifting knees x 3 reps holding 5  seconds each time,  standing IT band stretch reviewed, seated hamstring stretch added to HEP.  Rolling of L calf muscle.  Patient notes fatigue when performing standing exercises (in gastrocs).  Standing heel raises x 15 reps, toe raises x 10 reps, unilateral heel raises x 10 reps.                PT Short Term Goals - 12/21/18 1248      PT SHORT TERM GOAL #1   Title  The patient will be independent with initial HEP for ankle strengthening, hip strengthening, postural strengthening/core and LE stretching.    Time  4    Period  Weeks    Status  Achieved    Target Date  12/19/18      PT SHORT TERM GOAL #2   Title  The patient will improve ankle DF up to 10 degrees AROM.    Baseline  4 degrees L ankle DF and 6 degrees R ankle DF/ UPDATE:  11 degrees R and 11 degrees L    Time  4    Period  Weeks    Status  Achieved    Target Date  12/19/18      PT SHORT TERM GOAL #3   Title  The patient will maintain single leg stance x 8 seconds bilaterally to demo improved LE motor control/ankle stability.    Baseline  Patient is able to complete >15 seconds bilaterally.    Time  4    Period  Weeks    Status  Achieved    Target Date  12/19/18      PT SHORT TERM GOAL #4   Title  The patient will have 4/5 bilateral hip abduction strength to contribute to improved lateral hip stability during gait.    Baseline  5/5 bilaterally today.    Time  4    Period  Weeks    Status  Achieved    Target Date  12/19/18        PT Long Term Goals - 11/19/18 0916      PT LONG TERM GOAL #1   Title  The patient will return demo community exercise program for post d/c (to potentially include yoga with adriene foot emphasis, aquatics, gym routine depending on re-opening).    Time  8    Period  Weeks    Target Date  01/18/19      PT LONG TERM GOAL #2   Title  The patient will maintain tandem stance x 30 seconds with each foot posterior in order to demo improved lateral ankle and hip stability.    Time   8    Period  Weeks  Target Date  01/18/19      PT LONG TERM GOAL #3   Title  The patient will report walking 1 mile for exercise to demonstrate improved tolerance to extended gait.    Baseline  Walks 1/2 mile/ notes cramping and heaviness.    Time  8    Period  Weeks    Target Date  01/18/19            Plan - 12/21/18 1644    Clinical Impression Statement  The patient met all STGs.  PT encouraged greater participation in daily exercise.  She declined pool appointment due to "it is not the right time".    PT Treatment/Interventions  Aquatic Therapy;ADLs/Self Care Home Management;Neuromuscular re-education;Patient/family education;Gait training;Stair training;Functional mobility training;Therapeutic activities;Therapeutic exercise;Balance training;Manual techniques    PT Next Visit Plan  posture and core with gait, quadriped, arm swing with gait, standing/dynamic balance.    PT Home Exercise Plan  [id number]    Consulted and Agree with Plan of Care  Patient       Patient will benefit from skilled therapeutic intervention in order to improve the following deficits and impairments:  Decreased strength, Decreased mobility, Abnormal gait, Decreased activity tolerance, Impaired flexibility, Decreased range of motion  Visit Diagnosis: 1. Other abnormalities of gait and mobility   2. Muscle weakness (generalized)        Problem List Patient Active Problem List   Diagnosis Date Noted  . Perennial allergic rhinitis with a predominantly nonallergic component 04/13/2018  . Dyspnea 04/13/2018  . Allergic conjunctivitis 04/13/2018  . GERD (gastroesophageal reflux disease) 11/28/2017  . 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 Urinary frequency 07/07/2015  . Rectal pressure 07/07/2015  . Arthralgia of hip 03/22/2014  . Arthralgia, sacroiliac 03/22/2014  . ANA positive 03/10/2014  . 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 Uterovaginal prolapse 09/17/2012    WEAVER,CHRISTINA,  PT 12/21/2018, 4:50 PM  [facility] [address] [place], [name], [address] Phone: [phone]   Fax:  [phone]  Name: Lauren Osborne MRN: [id number] Date of Birth: 1955-06-21

## 2018-12-24 ENCOUNTER — Encounter: Payer: Self-pay | Admitting: Rehabilitative and Restorative Service Providers"

## 2018-12-24 ENCOUNTER — Ambulatory Visit: Payer: BC Managed Care – PPO | Admitting: Rehabilitative and Restorative Service Providers"

## 2018-12-24 ENCOUNTER — Other Ambulatory Visit: Payer: Self-pay

## 2018-12-24 DIAGNOSIS — M6281 Muscle weakness (generalized): Secondary | ICD-10-CM

## 2018-12-24 DIAGNOSIS — R2689 Other abnormalities of gait and mobility: Secondary | ICD-10-CM

## 2018-12-24 NOTE — Therapy (Signed)
[facility] [address] [place], [name], [address] Phone: [phone]   Fax:  [phone]  Physical Therapy Treatment  Patient Details  Name: Lauren Osborne MRN: [id number] Date of Birth: Apr 02, 1956 Referring Provider (PT): Heide Spark, MD  CLINIC OPERATION CHANGES: Outpatient [facility] is open at lower capacity following universal masking, social distancing, and patient screening.  The patient's COVID risk of complications score is 1.  Encounter Date: 12/24/2018  PT End of Session - 12/24/18 1339    Visit Number  5    Number of Visits  13    Date for PT Re-Evaluation  01/18/19    Authorization Type  state BCBS/ aquatics included    PT Start Time  2458    PT Stop Time  1316    PT Time Calculation (min)  41 min    Activity Tolerance  Patient tolerated treatment well    Behavior During Therapy  WFL for tasks assessed/performed       Past Medical History:  Diagnosis Date  . Abnormal Pap smear of cervix 12/2009   ASCUS cannot exclude HGSIL.  [name] Arthralgia of right hip 2015   Hip replacement   . Cystocele or rectocele with uterine prolapse   . Headache   . IBS (irritable bowel syndrome)   . Osteoarthritis   . Rectocele   . 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 From [facility]     Past Surgical History:  Procedure Laterality Date  . BUNIONECTOMY Right   . COLPOSCOPY  12/2009   negative  . FOOT SURGERY Right    bone removed   . hardward removal from right foot  12/15   Dr. Wardell Honour  . JOINT REPLACEMENT Right 2001   Hip replacement   . Posterior vaginal repair  10/21/2012   Procedure: VAGINAL REPAIR POSTERIOR; Surgeon: Daneil Dolin, MD; Location: [facility]; Service: Gynecology;;  . SINOSCOPY    . TONSILLECTOMY    . UTERINE SUSPENSION  10/21/12   Procedure: UTEROSACRAL SUSPENSION; Surgeon: Daneil Dolin, MD; Location: [facility] MAIN OR;  Service: Gynecology;;  . VAGINAL HYSTERECTOMY  10/21/12   Procedure: HYSTERECTOMY VAGINAL; Surgeon: Daneil Dolin, MD; Location: [facility]; Service: Gynecology; Laterality: N/A; bilateral salpingectomy    There were no vitals filed for this visit.  Subjective Assessment - 12/24/18 1235    Subjective  The patient reports she was tired after last session.  She was sore the day after exercise.    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    Patient Stated Goals  To get range of motion in feet and ankles to do squats and lunges.  Dream goals are to be able to jump rope. 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    Currently in Pain?  Yes    Pain Location  Neck    Pain Orientation  Right;Left    Pain Descriptors / Indicators  Aching    Pain Type  Acute pain    Pain Onset  1 to 4 weeks ago    Pain Frequency  Intermittent    Aggravating Factors   tightness    Pain Relieving Factors  stretching  [facility] Adult PT Treatment/Exercise - 12/24/18 1302      Ambulation/Gait   Ambulation/Gait  Yes    Ambulation/Gait Assistance  7: Independent    Assistive device  None    Gait Comments  Gait on level surfaces x 230 feet working on arm swing and longer stride length; treadmill x 10 minutes up to 2.4 mph with UE support and cues for longer stride and heel strike.       Self-Care   Self-Care  Other Self-Care Comments    Other Self-Care Comments   Patient expressed mild symptoms of calf tightness with walking.  She notes she has some fear of blood clotting.  We discussed other signs and sympotms of redness, warmth, resting pain.  She notes mild tightness with gait.  PT recommended slow progression of distance to build up muscular endurance for extended gait.      Exercises    Exercises  Other Exercises    Other Exercises   quadriped alternating UE/LE x 10 reps, seated physioball with marching and LE extension, seated marching with alternating UEs.                 PT Short Term Goals - 12/21/18 1248      PT SHORT TERM GOAL #1   Title  The patient will be independent with initial HEP for ankle strengthening, hip strengthening, postural strengthening/core and LE stretching.    Time  4    Period  Weeks    Status  Achieved    Target Date  12/19/18      PT SHORT TERM GOAL #2   Title  The patient will improve ankle DF up to 10 degrees AROM.    Baseline  4 degrees L ankle DF and 6 degrees R ankle DF/ UPDATE:  11 degrees R and 11 degrees L    Time  4    Period  Weeks    Status  Achieved    Target Date  12/19/18      PT SHORT TERM GOAL #3   Title  The patient will maintain single leg stance x 8 seconds bilaterally to demo improved LE motor control/ankle stability.    Baseline  Patient is able to complete >15 seconds bilaterally.    Time  4    Period  Weeks    Status  Achieved    Target Date  12/19/18      PT SHORT TERM GOAL #4   Title  The patient will have 4/5 bilateral hip abduction strength to contribute to improved lateral hip stability during gait.    Baseline  5/5 bilaterally today.    Time  4    Period  Weeks    Status  Achieved    Target Date  12/19/18        PT Long Term Goals - 11/19/18 0916      PT LONG TERM GOAL #1   Title  The patient will return demo community exercise program for post d/c (to potentially include yoga with adriene foot emphasis, aquatics, gym routine depending on re-opening).    Time  8    Period  Weeks    Target Date  01/18/19      PT LONG TERM GOAL #2   Title  The patient will maintain tandem stance x 30 seconds with each foot posterior in order to demo improved lateral ankle and hip stability.    Time  8    Period  Weeks  Target Date  01/18/19      PT LONG TERM GOAL #3   Title  The patient will  report walking 1 mile for exercise to demonstrate improved tolerance to extended gait.    Baseline  Walks 1/2 mile/ notes cramping and heaviness.    Time  8    Period  Weeks    Target Date  01/18/19            Plan - 12/24/18 1340    Clinical Impression Statement  Today's session emphasized greater endurance with walking, core strengthening, and discussion of concerns over muscle soreness.    PT Treatment/Interventions  Aquatic Therapy;ADLs/Self Care Home Management;Neuromuscular re-education;Patient/family education;Gait training;Stair training;Functional mobility training;Therapeutic activities;Therapeutic exercise;Balance training;Manual techniques    PT Next Visit Plan  Begin checking long term goals, pushing gait distance, increasing intensity of exercises (plank, more quadriped, etc)    Consulted and Agree with Plan of Care  Patient       Patient will benefit from skilled therapeutic intervention in order to improve the following deficits and impairments:  Decreased strength, Decreased mobility, Abnormal gait, Decreased activity tolerance, Impaired flexibility, Decreased range of motion  Visit Diagnosis: 1. Muscle weakness (generalized)   2. Other abnormalities of gait and mobility        Problem List Patient Active Problem List   Diagnosis Date Noted  . Perennial allergic rhinitis with a predominantly nonallergic component 04/13/2018  . Dyspnea 04/13/2018  . Allergic conjunctivitis 04/13/2018  . GERD (gastroesophageal reflux disease) 11/28/2017  . 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 Urinary frequency 07/07/2015  . Rectal pressure 07/07/2015  . Arthralgia of hip 03/22/2014  . Arthralgia, sacroiliac 03/22/2014  . ANA positive 03/10/2014  . 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 Uterovaginal prolapse 09/17/2012    WEAVER,CHRISTINA, PT 12/24/2018, 1:43 PM  [facility] [address] [place],  [name], [address] Phone: [phone]   Fax:  [phone]  Name: Lauren Osborne MRN: [id number] Date of Birth: 04/15/1956

## 2018-12-28 ENCOUNTER — Ambulatory Visit: Payer: BC Managed Care – PPO | Admitting: Rehabilitative and Restorative Service Providers"

## 2018-12-30 ENCOUNTER — Ambulatory Visit: Payer: BC Managed Care – PPO | Admitting: Rehabilitative and Restorative Service Providers"

## 2019-01-04 ENCOUNTER — Ambulatory Visit: Payer: BC Managed Care – PPO | Admitting: Rehabilitative and Restorative Service Providers"

## 2019-01-07 ENCOUNTER — Ambulatory Visit: Payer: BC Managed Care – PPO | Admitting: Rehabilitative and Restorative Service Providers"

## 2019-01-11 ENCOUNTER — Ambulatory Visit: Payer: BC Managed Care – PPO | Admitting: Rehabilitative and Restorative Service Providers"

## 2019-01-14 ENCOUNTER — Ambulatory Visit: Payer: BC Managed Care – PPO | Admitting: Rehabilitative and Restorative Service Providers"

## 2019-02-22 ENCOUNTER — Encounter: Payer: Self-pay | Admitting: Rehabilitative and Restorative Service Providers"

## 2019-02-22 NOTE — Therapy (Signed)
[facility] [address] [place], [name], [address] Phone: [phone]   Fax:  [phone]  Patient Details  Name: Lauren Osborne MRN: [id number] Date of Birth: Jun 30, 1955 Referring Provider:  No ref. provider found  Encounter Date: last visit 12/24/18  PHYSICAL THERAPY DISCHARGE SUMMARY  Visits from Start of Care: 6  Current functional level related to goals / functional outcomes: PT Short Term Goals - 12/21/18 1248      PT SHORT TERM GOAL #1   Title  The patient will be independent with initial HEP for ankle strengthening, hip strengthening, postural strengthening/core and LE stretching.    Time  4    Period  Weeks    Status  Achieved    Target Date  12/19/18      PT SHORT TERM GOAL #2   Title  The patient will improve ankle DF up to 10 degrees AROM.    Baseline  4 degrees L ankle DF and 6 degrees R ankle DF/ UPDATE:  11 degrees R and 11 degrees L    Time  4    Period  Weeks    Status  Achieved    Target Date  12/19/18      PT SHORT TERM GOAL #3   Title  The patient will maintain single leg stance x 8 seconds bilaterally to demo improved LE motor control/ankle stability.    Baseline  Patient is able to complete >15 seconds bilaterally.    Time  4    Period  Weeks    Status  Achieved    Target Date  12/19/18      PT SHORT TERM GOAL #4   Title  The patient will have 4/5 bilateral hip abduction strength to contribute to improved lateral hip stability during gait.    Baseline  5/5 bilaterally today.    Time  4    Period  Weeks    Status  Achieved    Target Date  12/19/18      PT Long Term Goals - 11/19/18 0916      PT LONG TERM GOAL #1   Title  The patient will return demo community exercise program for post d/c (to potentially include yoga with adriene foot emphasis, aquatics, gym routine depending on re-opening).    Time  8    Period  Weeks    Target Date  01/18/19      PT LONG TERM GOAL #2   Title  The patient will maintain tandem stance x 30 seconds with each foot posterior in order to demo improved lateral ankle and hip stability.    Time  8    Period  Weeks    Target Date  01/18/19      PT LONG TERM GOAL #3   Title  The patient will report walking 1 mile for exercise to demonstrate improved tolerance to extended gait.    Baseline  Walks 1/2 mile/ notes cramping and heaviness.    Time  8    Period  Weeks    Target Date  01/18/19      Goals not reassessed.  The patient called to cancel due to work conflict and then due to financial reasons.   Remaining deficits: See last note 12/24/18   Education / Equipment: HEP  Plan: Patient agrees to discharge.  Patient goals were partially met. Patient is being discharged due to financial reasons.  ?????      Thank you for the  referral of this patient. Rudell Cobb, MPT   WEAVER,CHRISTINA 02/22/2019, 11:42 AM  [facility] [address] [place], [name], [address] Phone: [phone]   Fax:  [phone]

## 2019-02-23 ENCOUNTER — Other Ambulatory Visit: Payer: Self-pay | Admitting: Obstetrics & Gynecology

## 2019-02-23 DIAGNOSIS — Z1231 Encounter for screening mammogram for malignant neoplasm of breast: Secondary | ICD-10-CM

## 2019-04-13 DIAGNOSIS — C801 Malignant (primary) neoplasm, unspecified: Secondary | ICD-10-CM

## 2019-04-13 HISTORY — DX: Malignant (primary) neoplasm, unspecified: C80.1

## 2019-04-14 ENCOUNTER — Other Ambulatory Visit: Payer: Self-pay

## 2019-04-14 ENCOUNTER — Ambulatory Visit
Admission: RE | Admit: 2019-04-14 | Discharge: 2019-04-14 | Disposition: A | Discharge status: Home / Self Care | Payer: BC Managed Care – PPO | Source: Ambulatory Visit | Attending: Obstetrics & Gynecology | Admitting: Obstetrics & Gynecology

## 2019-04-14 DIAGNOSIS — Z1231 Encounter for screening mammogram for malignant neoplasm of breast: Secondary | ICD-10-CM

## 2019-04-15 ENCOUNTER — Other Ambulatory Visit: Payer: Self-pay | Admitting: Obstetrics & Gynecology

## 2019-04-15 DIAGNOSIS — R928 Other abnormal and inconclusive findings on diagnostic imaging of breast: Secondary | ICD-10-CM

## 2019-04-20 ENCOUNTER — Ambulatory Visit
Admission: RE | Admit: 2019-04-20 | Discharge: 2019-04-20 | Disposition: A | Discharge status: Home / Self Care | Payer: BC Managed Care – PPO | Source: Ambulatory Visit | Attending: Obstetrics & Gynecology | Admitting: Obstetrics & Gynecology

## 2019-04-20 ENCOUNTER — Other Ambulatory Visit: Payer: Self-pay

## 2019-04-20 ENCOUNTER — Other Ambulatory Visit: Payer: Self-pay | Admitting: Obstetrics & Gynecology

## 2019-04-20 DIAGNOSIS — N6489 Other specified disorders of breast: Secondary | ICD-10-CM

## 2019-04-20 DIAGNOSIS — R928 Other abnormal and inconclusive findings on diagnostic imaging of breast: Secondary | ICD-10-CM

## 2019-04-22 ENCOUNTER — Ambulatory Visit
Admission: RE | Admit: 2019-04-22 | Discharge: 2019-04-22 | Disposition: A | Discharge status: Home / Self Care | Payer: BC Managed Care – PPO | Source: Ambulatory Visit | Attending: Obstetrics & Gynecology | Admitting: Obstetrics & Gynecology

## 2019-04-22 ENCOUNTER — Other Ambulatory Visit: Payer: Self-pay | Admitting: General Practice

## 2019-04-22 ENCOUNTER — Other Ambulatory Visit: Payer: Self-pay | Admitting: Obstetrics & Gynecology

## 2019-04-22 ENCOUNTER — Other Ambulatory Visit: Payer: Self-pay

## 2019-04-22 DIAGNOSIS — N6489 Other specified disorders of breast: Secondary | ICD-10-CM

## 2019-04-23 ENCOUNTER — Telehealth: Payer: Self-pay | Admitting: *Deleted

## 2019-04-23 ENCOUNTER — Telehealth: Payer: Self-pay | Admitting: Obstetrics & Gynecology

## 2019-04-23 ENCOUNTER — Telehealth (INDEPENDENT_AMBULATORY_CARE_PROVIDER_SITE_OTHER): Payer: BC Managed Care – PPO | Admitting: Obstetrics & Gynecology

## 2019-04-23 DIAGNOSIS — C50912 Malignant neoplasm of unspecified site of left female breast: Secondary | ICD-10-CM

## 2019-04-23 NOTE — Telephone Encounter (Signed)
Spoke with patient. Patient is requesting to proceed with appt previously offered at [facility] for the multidisciplinary clinic on 04/28/19. Patient states first available appt at [facility] is 05/21/19.   Advised patient I will have to return call to [facility] and speak with the Nurse Navigator to confirm appt still available and return call to notify. Patient verbalizes understanding and is agreeable.    Call placed to [facility], spoke with Manuela Schwartz, RN. Was advised she will forward message to [facility] Breast Navigator for return call, she will further assist with scheduling options.

## 2019-04-23 NOTE — Telephone Encounter (Signed)
Patient is asking to talk with Sharee Pimple again. She said "I was talking to her earlier and may want a referral".

## 2019-04-23 NOTE — Telephone Encounter (Signed)
Spoke with patient. 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   Patient request to discuss referral to [facility] or [facility] with Dr. Sabra Heck. MyChart consult scheduled for today at 12:45pm.   Confirmed with patient that she has stopped estrogen.   Routing to Dr. Sabra Heck.

## 2019-04-23 NOTE — Telephone Encounter (Signed)
Patient is calling to speak with Sharee Pimple.

## 2019-04-23 NOTE — Telephone Encounter (Signed)
Manuela Schwartz, RN, nurse navigator from [facility], called to provide results and update.   04/22/19 left breast biopsy results: Invasive ductal carcinoma and DCIS.   Patient has been notified of result by [facility]. Patient declined multidisciplinary appt at [facility] on 04/28/19. Patient discussed possible referral to [facility] or [facility]. Patient is going to call [facility] for recommendations. Patient will need a referral. Call with any assistance.

## 2019-04-23 NOTE — Telephone Encounter (Signed)
Spoke with patient. Patient is requesting another referral to [facility] for another opinion. (See previous telephone encounter dated 04/23/19).  Patient does not have a provider preference. Patient has contacted [facility] at [phone], was advised to fax referral to [phone]. Advised patient I will review request with Dr. Sabra Heck. RN will contact [facility] to determine what information is needed and f/u with her on 12/14. Patient verbalizes understanding and is agreeable to plan.   Routing to Dr. Sabra Heck to review.

## 2019-04-23 NOTE — Telephone Encounter (Signed)
Patient scheduled for Multidisciplinary Clinic at [facility] on 04/28/19 at 8:15am, patient should plan on appt being 3 hrs long. Confirmed with Bary Castilla, RN.   Call returned to patient, advised as seen above. Patient agreeable to date and time, address provided. Questions answered. Patient will contact office if any additional assistance needed.   Length of call 30 min.   Routing to provider for final review. Patient is agreeable to disposition. Will close encounter.

## 2019-04-25 ENCOUNTER — Encounter: Payer: Self-pay | Admitting: Obstetrics & Gynecology

## 2019-04-25 DIAGNOSIS — C50912 Malignant neoplasm of unspecified site of left female breast: Secondary | ICD-10-CM | POA: Insufficient documentation

## 2019-04-25 NOTE — Progress Notes (Signed)
Virtual Visit via Video Note  I connected with Lauren Osborne on 04/25/19 at 12:45 PM EST by a video enabled telemedicine application and verified that I am speaking with the correct person using two identifiers.  She was desirous of virtual visit including its limitations in lieu of in-person appt as this is a consultation visit only.    Location: Patient: home Provider: office  History of Present Illness: 63 yo G2P2 MWF with new diagnosis of breast cancer that she received today.  Pathology showed DCIS and invasive ductal ca, grade 2/3.  She has many questions about her pathology.  Pathology reviewed.  Grading discussed.  Pt aware staging is completed surgically but, at this point, this appears to be stage one.  Additional markers are not back.  We discussed the implications of these.  She specifically has some questions about triple negative breast cancer.  This was discussed as well.  Appt was made on Wednesday at the comprehensive breast clinic here but pt wants to consider going to academic center.  Options discussed.  She is going to pursue appt at [facility]  Information provided as she can make her own appointment.  she will call and let me know when she has the [facility] appt after it is made.   Observations/Objective: WNWD [facility], NAD  Assessment and Plan: 63 yo G2P2 MWF with new diagnosis of invasive ductal carcinoma, Grade2/3  Follow Up Instructions: She is going to call [facility] for appt and will let me know if needs any help with appointment.  Also, if this appointment is farther out than makes her comfortable, she will call and have [place] keep the Wednesday appt here in [place].    I discussed the assessment and treatment plan with the patient. The patient was provided an opportunity to ask questions and all were answered. The patient agreed with the plan and demonstrated an understanding of the instructions.   The patient was advised to call back or seek an in-person evaluation if the symptoms  worsen or if the condition fails to improve as anticipated.  I provided 20 minutes of non-face-to-face time during this encounter.   [name], MD

## 2019-04-26 ENCOUNTER — Telehealth: Payer: Self-pay | Admitting: *Deleted

## 2019-04-26 NOTE — Telephone Encounter (Signed)
Spoke with patient. Patient is requesting a referral to [place], Dr. Fraser Din. Advised patient I will call to schedule and return call. Patient states she is planning to keep her appt as scheduled for [facility] on 12/16.

## 2019-04-26 NOTE — Telephone Encounter (Signed)
Confirmed [facility] for 04/28/19 at 8:15am .  Instructions and contact information given.

## 2019-04-26 NOTE — Telephone Encounter (Signed)
Spoke with Nelle Don at [facility] [phone]. Was advised to return call with referral and name of surgeon, can then assist with scheduling. Fax # [phone].

## 2019-04-26 NOTE — Telephone Encounter (Signed)
Patient calling to check status of referral.

## 2019-04-26 NOTE — Telephone Encounter (Signed)
Patient notified of appt. Patient is agreeable to date and time. Patient will return call to provide update where she decides to receive care.  Patient removed from [facility] hold.   Routing to provider for final review. Patient is agreeable to disposition. Will close encounter.  Cc: Magdalene Patricia

## 2019-04-26 NOTE — Telephone Encounter (Signed)
Spoke with [employer]. Patient is scheduled for appt on 05/05/19 at 2pm with Dr. Genia Hotter. Arrive at 1:45pm. They will send out information to patient regarding appt.   [address] [place], [place] [address]  Fax OV notes and results to [phone]. They will assist patient with any additional records request.   Referral faxed. Order placed in [place].

## 2019-04-27 ENCOUNTER — Other Ambulatory Visit: Payer: Self-pay | Admitting: *Deleted

## 2019-04-27 DIAGNOSIS — C50212 Malignant neoplasm of upper-inner quadrant of left female breast: Secondary | ICD-10-CM | POA: Insufficient documentation

## 2019-04-27 DIAGNOSIS — Z170 Estrogen receptor positive status [ER+]: Secondary | ICD-10-CM | POA: Insufficient documentation

## 2019-04-27 NOTE — Progress Notes (Signed)
[facility]  Telephone:([phone] Fax:([phone]     ID: [name] DOB: [birth year]  MR#: [id number]  CHY#:[id number]  Patient Care Team: [name], MD as PCP - General (Family Medicine) Mauro Kaufmann, RN as Oncology Nurse Navigator Rockwell Germany, RN as Oncology Nurse Navigator Jovita Kussmaul, MD as Consulting Physician (General Surgery) Tunya Held, Virgie Dad, MD as Consulting Physician (Oncology) [name], MD as Consulting Physician (Radiation Oncology) [name], MD as Consulting Physician (Gynecology) [name], MD as Consulting Physician (Urology) [name], MD as Consulting Physician (Ophthalmology) Melvenia Beam, MD as Consulting Physician (Neurology) [name], MD OTHER MD:  CHIEF COMPLAINT: Estrogen receptor positive breast cancer  CURRENT TREATMENT: Awaiting definitive surgery   HISTORY OF CURRENT ILLNESS: Malary Aylesworth had routine screening mammography on 04/14/2019 showing a possible abnormality in the bilateral breasts. She underwent bilateral diagnostic mammography with tomography and left breast ultrasonography at [facility] on 04/20/2019 showing: breast density category B; right breast with fibroglandular tissue without persistent mass or distortion; left breast with 0.5 cm irregular mass at 11 o'clock; no abnormal left axillary lymph nodes.  Accordingly on 04/22/2019 she proceeded to biopsy of the left breast area in question. The pathology from this procedure ([id number]) showed: invasive ductal carcinoma, grade 2; ductal carcinoma in situ. Prognostic indicators significant for: estrogen receptor, 95% positive with strong staining intensity and progesterone receptor, 50% positive with moderate staining intensity. Proliferation marker Ki67 at 5%. HER2 negative by immunohistochemistry (1+).  The patient's subsequent history is as detailed below.   INTERVAL HISTORY: Lauren Osborne was  evaluated in the multidisciplinary breast cancer clinic on 04/28/2019. Her case was also presented at the multidisciplinary breast cancer conference on the same day. At that time a preliminary plan was proposed: Breast conserving surgery with sentinel lymph node sampling, adjuvant radiation, antiestrogens   REVIEW OF SYSTEMS: Xavier reports fatigue that affects her activities, wearing glasses, change in vision, wearing hearing aids, tinnitus, runny nose/sinus problems, hoarse voice, mouth sores, constant shortness of breath, sleeping with 2 pillows, dry cough, heartburn, UTI/kidney infections, easy bruising, joint pain/arthritis with difficulties walking, and headaches. There were no specific symptoms leading to the original mammogram, which was routinely scheduled. The patient denies unusual nausea, vomiting, stiff neck, dizziness, or gait imbalance. There has been no chest pain or pressure, and no change in bowel or bladder habits. The patient denies fever, rash, bleeding, or unexplained weight loss. A detailed review of systems was otherwise entirely negative.   PAST MEDICAL HISTORY: Past Medical History:  Diagnosis Date   Abnormal Pap smear of cervix 12/2009   ASCUS cannot exclude HGSIL.   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 From [facility]     PAST SURGICAL HISTORY: Past Surgical History:  Procedure Laterality Date   BUNIONECTOMY Right    COLPOSCOPY  12/2009   negative   FOOT SURGERY Right    bone removed    hardward removal from right foot  12/15   Dr. Wardell Honour   JOINT REPLACEMENT Right 2001   Hip replacement    Posterior vaginal repair  10/21/2012   Procedure: VAGINAL REPAIR POSTERIOR; Surgeon: Daneil Dolin, MD; Location: [facility] MAIN  OR; Service: Gynecology;;   SINOSCOPY     TONSILLECTOMY     UTERINE SUSPENSION  10/21/12   Procedure: UTEROSACRAL SUSPENSION; Surgeon: Daneil Dolin, MD; Location: [facility] MAIN OR; Service: Gynecology;;   VAGINAL HYSTERECTOMY  10/21/12   Procedure: HYSTERECTOMY VAGINAL; Surgeon: Daneil Dolin, MD; Location: [place]; Service: Gynecology; Laterality: N/A; bilateral salpingectomy    FAMILY HISTORY: Family History  Problem Relation Age of Onset   Rheum arthritis Mother    Cancer Father    Osteoarthritis Sister    Osteoarthritis Brother    Crohn's disease Son    Allergic rhinitis Neg Hx    Asthma Neg Hx    Eczema Neg Hx    Urticaria Neg Hx    Patient's father was 4 years old when he died from cancer, the patient does not know what type. The patient has little information on her father's family. Patient's mother died at age 37. The patient denies a family hx of breast or ovarian cancer. She has 3 maternal half-siblings, 2 half-brothers and 1 half-sister.   GYNECOLOGIC HISTORY:  Patient's last menstrual period was 05/13/2005. Menarche: 63 years old Age at first live birth: 63 years old [place] P 2 LMP age 63 Contraceptive: used for 6 months HRT no, but used a trial of local cream  Hysterectomy? Yes, 10/2012 BSO? 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 She is married. Husband [name] is retired from working in Engineer, technical sales. At home is just the two of them. Daughter [name], age 63, works in administration in [place]. Son [name], age 57, works in Engineer, technical sales in [place], [name]. Gita has no grandchildren. She is not a Designer, fashion/clothing.    ADVANCED DIRECTIVES: in place   HEALTH MAINTENANCE: Social History   Tobacco Use   Smoking status: Never Smoker   Smokeless tobacco: Never Used  Substance Use Topics   Alcohol use: Yes    Alcohol/week: 4.0 standard drinks    Types: 4 Glasses of wine per  week    Comment: occ   Drug use: Never     Colonoscopy: 06/2014 (Dr. Carlton Adam), repeat in 10 years  PAP: 09/2016, negative  Bone density: yes, date unsure   Allergies  Allergen Reactions   Cephalexin Diarrhea   Ciprofloxacin Diarrhea   Sulfa Antibiotics Other (See Comments)    Stomach cramps and diarrhea    Current Outpatient Medications  Medication Sig Dispense Refill   diazepam (VALIUM) 10 MG tablet 10 mg at bedtime.     omeprazole (PRILOSEC) 20 MG capsule TAKE 1 CAPSULE BY MOUTH EVERY DAY (Patient taking differently: TAKE 1 CAPSULE BY MOUTH EVERY DAY, as needed) 30 capsule 1   solifenacin (VESICARE) 5 MG tablet Take 1 tablet (5 mg total) by mouth as needed. 90 tablet 4   aspirin-acetaminophen-caffeine (EXCEDRIN MIGRAINE) 250-250-65 MG tablet Take by mouth as needed.      azelastine (OPTIVAR) 0.05 % ophthalmic solution Place 1 drop into both eyes 2 (two) times daily. (Patient not taking: Reported on 04/28/2019) 6 mL 5   Estradiol (VAGIFEM) 10 MCG TABS vaginal tablet Place 1 tablet (10 mcg total) vaginally 2 (two) times a week. (Patient not taking: Reported on 04/28/2019) 24 tablet 3   fexofenadine (ALLEGRA) 180 MG tablet Take 180 mg by mouth daily.     ibuprofen (ADVIL,MOTRIN) 200 MG tablet Take 200 mg by mouth every 6 (six) hours as needed.     Meth-Hyo-M Bl-Na Phos-Ph  Sal (URIBEL) 118 MG CAPS Take 1 capsule (118 mg total) by mouth 4 (four) times daily. 30 capsule 0   No current facility-administered medications for this visit.    OBJECTIVE: Middle-aged white woman in no acute distress  Vitals:   04/28/19 0900  BP: 135/69  Pulse: 75  Resp: 20  Temp: 98.2 F (36.8 C)  SpO2: 100%     Body mass index is 28.75 kg/m.   Wt Readings from Last 3 Encounters:  04/28/19 200 lb 6.4 oz (90.9 kg)  04/13/18 176 lb (79.8 kg)  02/03/18 179 lb (81.2 kg)      ECOG FS:1 - Symptomatic but completely ambulatory  Ocular: Sclerae unicteric Ear-nose-throat: Wearing a  mask Lymphatic: No cervical or supraclavicular adenopathy Lungs no rales or rhonchi Heart regular rate and rhythm Abd soft, nontender, positive bowel sounds MSK no focal spinal tenderness, no joint edema Neuro: non-focal, well-oriented, appropriate affect Breasts: The right breast is status post recent biopsy.  There is moderate ecchymosis.  Otherwise the breast is unremarkable.  The left breast is benign.  Both axillae are benign.   LAB RESULTS:  CMP     Component Value Date/Time   NA 141 04/28/2019 0842   NA 141 12/19/2017 0855   K 3.7 04/28/2019 0842   CL 104 04/28/2019 0842   CO2 29 04/28/2019 0842   GLUCOSE 72 04/28/2019 0842   BUN 17 04/28/2019 0842   BUN 14 12/19/2017 0855   CREATININE 0.86 04/28/2019 0842   CREATININE 0.85 07/06/2015 1108   CALCIUM 9.0 04/28/2019 0842   PROT 6.7 04/28/2019 0842   PROT 6.4 12/19/2017 0855   ALBUMIN 3.9 04/28/2019 0842   AST 17 04/28/2019 0842   ALT 17 04/28/2019 0842   ALKPHOS 81 04/28/2019 0842   BILITOT 0.4 04/28/2019 0842   GFRNONAA >60 04/28/2019 0842   GFRAA >60 04/28/2019 0842    No results found for: TOTALPROTELP, ALBUMINELP, A1GS, A2GS, BETS, BETA2SER, GAMS, MSPIKE, SPEI  No results found for: KPAFRELGTCHN, LAMBDASER, KAPLAMBRATIO  Lab Results  Component Value Date   WBC 6.8 04/28/2019   NEUTROABS 4.5 04/28/2019   HGB 13.9 04/28/2019   HCT 42.5 04/28/2019   MCV 90.6 04/28/2019   PLT 299 04/28/2019    No results found for: LABCA2  No components found for: DXIPJA250  No results for input(s): INR in the last 168 hours.  No results found for: LABCA2  No results found for: NLZ767  No results found for: HAL937  No results found for: TKW409  No results found for: CA2729  No components found for: HGQUANT  No results found for: CEA1 / No results found for: CEA1   No results found for: AFPTUMOR  No results found for: CHROMOGRNA  No results found for: KPAFRELGTCHN, LAMBDASER, KAPLAMBRATIO (kappa/lambda  light chains)  No results found for: HGBA, HGBA2QUANT, HGBFQUANT, HGBSQUAN (Hemoglobinopathy evaluation)   No results found for: LDH  No results found for: IRON, TIBC, IRONPCTSAT (Iron and TIBC)  No results found for: FERRITIN  Urinalysis    Component Value Date/Time   BILIRUBINUR N 01/27/2018 1555   PROTEINUR Negative 01/27/2018 1555   UROBILINOGEN 0.2 01/27/2018 1555   NITRITE POSITIVE 01/27/2018 1555   LEUKOCYTESUR Negative 01/27/2018 1555     STUDIES: US BREAST LTD UNI LEFT INC AXILLA  Result Date: 04/20/2019 CLINICAL DATA:  Patient presents as a recall from screen for possible right breast asymmetry as well as possible left breast mass and separate left breast asymmetry. EXAM: DIGITAL DIAGNOSTIC  BILATERAL MAMMOGRAM WITH TOMO ULTRASOUND LEFT BREAST COMPARISON:  Previous exam(s). ACR Breast Density Category b: There are scattered areas of fibroglandular density. FINDINGS: Mammogram: Right breast: Additional spot compression tomosynthesis views were performed for the questioned asymmetry in the outer right breast. On the additional imaging there is fibroglandular tissue without a persistent mass or distortion. Left breast: Additional spot compression tomosynthesis views were performed for the questioned mass in the upper slightly medial left breast. On the additional imaging there is persistence of a 0.5 cm spiculated mass. Additional imaging of this separately questioned asymmetry in the more medial aspect of the left breast demonstrates resolution of the asymmetry. Ultrasound: Targeted ultrasound is performed in the left breast at 11 o'clock 4 cm from the nipple demonstrating an irregular hypoechoic mass with posterior shadowing measuring 0.4 x 0.5 x 0.5 cm. This corresponds to the mammographic finding. Targeted ultrasound of the left axilla demonstrates no abnormal appearing lymph nodes. IMPRESSION: Left breast mass at 11 o'clock measuring 0.5 cm is suspicious. RECOMMENDATION:  Stereotactic core needle biopsy of the left breast mass at 11 o'clock. Given the small size of the mass and difficulty in visualizing sonographically without harmonics, stereotactic biopsy is recommended for accurate sampling. I have discussed the findings and recommendations with the patient. If applicable, a reminder letter will be sent to the patient regarding the next appointment. BI-RADS CATEGORY  4: Suspicious. Electronically Signed   By: Audie Pinto M.D.   On: 04/20/2019 12:08   MM DIAG BREAST TOMO BILATERAL  Result Date: 04/20/2019 CLINICAL DATA:  Patient presents as a recall from screen for possible right breast asymmetry as well as possible left breast mass and separate left breast asymmetry. EXAM: DIGITAL DIAGNOSTIC BILATERAL MAMMOGRAM WITH TOMO ULTRASOUND LEFT BREAST COMPARISON:  Previous exam(s). ACR Breast Density Category b: There are scattered areas of fibroglandular density. FINDINGS: Mammogram: Right breast: Additional spot compression tomosynthesis views were performed for the questioned asymmetry in the outer right breast. On the additional imaging there is fibroglandular tissue without a persistent mass or distortion. Left breast: Additional spot compression tomosynthesis views were performed for the questioned mass in the upper slightly medial left breast. On the additional imaging there is persistence of a 0.5 cm spiculated mass. Additional imaging of this separately questioned asymmetry in the more medial aspect of the left breast demonstrates resolution of the asymmetry. Ultrasound: Targeted ultrasound is performed in the left breast at 11 o'clock 4 cm from the nipple demonstrating an irregular hypoechoic mass with posterior shadowing measuring 0.4 x 0.5 x 0.5 cm. This corresponds to the mammographic finding. Targeted ultrasound of the left axilla demonstrates no abnormal appearing lymph nodes. IMPRESSION: Left breast mass at 11 o'clock measuring 0.5 cm is suspicious.  RECOMMENDATION: Stereotactic core needle biopsy of the left breast mass at 11 o'clock. Given the small size of the mass and difficulty in visualizing sonographically without harmonics, stereotactic biopsy is recommended for accurate sampling. I have discussed the findings and recommendations with the patient. If applicable, a reminder letter will be sent to the patient regarding the next appointment. BI-RADS CATEGORY  4: Suspicious. Electronically Signed   By: Audie Pinto M.D.   On: 04/20/2019 12:08   MM 3D SCREEN BREAST BILATERAL  Result Date: 04/14/2019 CLINICAL DATA:  Screening. EXAM: DIGITAL SCREENING BILATERAL MAMMOGRAM WITH TOMO AND CAD COMPARISON:  Previous exam(s). ACR Breast Density Category b: There are scattered areas of fibroglandular density. FINDINGS: In the right breast , a possible asymmetry requires further evaluation. In the left  breast , possible distortion and asymmetry requires further evaluation. Images were processed with CAD. IMPRESSION: Further evaluation is suggested for possible asymmetry in the right breast. Further evaluation is suggested for possible distortion and asymmetry in the left breast. RECOMMENDATION: Diagnostic mammogram and possibly ultrasound of both breasts. (Code:[id number]) The patient will be contacted regarding the findings, and additional imaging will be scheduled. BI-RADS CATEGORY  0: Incomplete. Need additional imaging evaluation and/or prior mammograms for comparison. Electronically Signed   By: Kristopher Oppenheim M.D.   On: 04/14/2019 12:11   MM CLIP PLACEMENT LEFT  Result Date: 04/22/2019 CLINICAL DATA:  Patient underwent stereotactic biopsy of a left breast mass. EXAM: DIAGNOSTIC LEFT MAMMOGRAM POST STEREOTACTIC BIOPSY COMPARISON:  Previous exam(s). FINDINGS: Mammographic images were obtained following stereotactic guided biopsy of a left breast mass at 11 o'clock. The biopsy marking clip is in expected position at the site of biopsy. IMPRESSION:  Appropriate positioning of the coil shaped biopsy marking clip at the site of biopsy in the left upper inner breast. Final Assessment: Post Procedure Mammograms for Marker Placement Electronically Signed   By: Audie Pinto M.D.   On: 04/22/2019 10:01   MM LT BREAST BX W LOC DEV 1ST LESION IMAGE BX SPEC STEREO GUIDE  Addendum Date: 04/23/2019   ADDENDUM REPORT: 04/23/2019 15:03 ADDENDUM: Upon reconsideration , the patient decided she would like to be referred to [facility] on April 28, 2019. Stacie Acres, RN on 04/23/2019. Electronically Signed   By: Audie Pinto M.D.   On: 04/23/2019 15:03   Addendum Date: 04/23/2019   ADDENDUM REPORT: 04/23/2019 13:48 ADDENDUM: Pathology revealed GRADE II INVASIVE DUCTAL CARCINOMA, DUCTAL CARCINOMA IN SITU of the LEFT breast, 11 o'clock. This was found to be concordant by Dr. Audie Pinto. Pathology results were discussed with the patient by telephone. The patient reported doing well after the biopsy with tenderness at the site. Post biopsy instructions and care were reviewed and questions were answered. The patient was encouraged to call [facility] for any additional concerns. Dr. Hale Bogus of [facility] spoke with patient regarding pathology results and surgical referral. At the request of the patient, her follow up care after biopsy will proceed at [facility]. [facility] was contacted by Glorianne Manchester RN on April 23, 2019 to facilitate process. Pathology results reported by Stacie Acres, RN on 04/23/2019. Electronically Signed   By: Audie Pinto M.D.   On: 04/23/2019 13:48   Result Date: 04/23/2019 CLINICAL DATA:  Patient presents for biopsy of a left breast mass. EXAM: LEFT BREAST STEREOTACTIC CORE NEEDLE BIOPSY COMPARISON:  Previous exams. FINDINGS: The patient and I discussed the procedure of  stereotactic-guided biopsy including benefits and alternatives. We discussed the high likelihood of a successful procedure. We discussed the risks of the procedure including infection, bleeding, tissue injury, clip migration, and inadequate sampling. Informed written consent was given. The usual time out protocol was performed immediately prior to the procedure. Using sterile technique and 1% Lidocaine as local anesthetic, under stereotactic guidance, a 9 gauge vacuum assisted device was used to perform core needle biopsy of a mass in the upper inner quadrant of the left breast using a superior approach. Lesion quadrant: Upper inner quadrant At the conclusion of the procedure, a coil tissue marker clip was deployed into the biopsy cavity. Follow-up 2-view mammogram was performed and dictated separately. IMPRESSION: Stereotactic-guided biopsy of a left breast mass  at 11 o'clock. There was a small post biopsy hematoma. Electronically Signed: By: Audie Pinto M.D. On: 04/22/2019 10:03    ELIGIBLE FOR AVAILABLE RESEARCH PROTOCOL: [facility] [place]  ASSESSMENT: 63 y.o. [place], [place] woman status post left breast upper inner quadrant biopsy 04/22/2019 for a clinical T1a N0, stage IA invasive ductal carcinoma, grade 2, estrogen and progesterone receptor positive, HER-2 not amplified, with an MIB-1 of 5%.  (1) breast conserving surgery pending  (2) adjuvant radiation to follow  (3) consider anastrozole  PLAN: I spent approximately 60 minutes face to face with Dwight with more than 50% of that time spent in counseling and coordination of care. Specifically we reviewed the biology of the patient's diagnosis and the specifics of her situation.    We discussed the difference between local and systemic therapy. In terms of loco-regional treatment, lumpectomy plus radiation is equivalent to mastectomy as far as survival is concerned. For this reason, and because the cosmetic results are generally superior, we  recommend breast conserving surgery. We also noted that in terms of sequencing of treatments, whether systemic therapy or surgery is done first does not affect the ultimate outcome.  We then discussed the rationale for systemic therapy. There is some risk that this cancer may have already spread to other parts of her body. Patients frequently ask at this point about bone scans, CAT scans and PET scans to find out if they have occult breast cancer somewhere else. The problem is that in early stage disease we are much more likely to find false positives then true cancers and this would expose the patient to unnecessary procedures as well as unnecessary radiation. Scans cannot answer the question the patient really would like to know, which is whether she has microscopic disease elsewhere in her body. For those reasons we do not recommend them.  Of course we would proceed to aggressive evaluation of any symptoms that might suggest metastatic disease, but that is not the case here.  Next we went over the options for systemic therapy which are anti-estrogens, anti-HER-2 immunotherapy, and chemotherapy. Wanette does not meet criteria for anti-HER-2 immunotherapy. She is a good candidate for anti-estrogens.  The question of chemotherapy is more complicated. Chemotherapy is most effective in rapidly growing, aggressive tumors. It is much less effective in slow growing cancers, like Chyanne 's.  In addition we generally do not recommend chemotherapy in tumors 5 mm or less.  Accordingly chemotherapy is not anticipated in this case.  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  A second concern is that she has had foot drops which have been temporary however with some antibiotics.  This is well documented of course for quinolones but she does not think it was a quinolone.  In any case she has some  concerns regarding antiestrogens.  She is going to be researching anastrozole in particular before her return visit here.  She understands that if her tumor proves to be 5 mm or less then antiestrogens are to be considered but are not strongly recommended.  In her case she might choose observation.  We will make that decision once we have the final path results and she has recovered from radiation.  She will see me in March to discuss further.  Yuliet has a good understanding of the overall plan. She agrees with it. She knows the goal of  treatment in her case is cure. She will call with any problems that may develop before her next visit here.   [name], MD   04/28/2019 11:03 AM Medical Oncology and Hematology [facility] [place], [place] [address] Tel. [phone]    Fax. [phone]   This document serves as a record of services personally performed by Lurline Del, MD. It was created on his behalf by Wilburn Mylar, a trained medical scribe. The creation of this record is based on the scribe's personal observations and the provider's statements to them.   I, Lurline Del MD, have reviewed the above documentation for accuracy and completeness, and I agree with the above.

## 2019-04-28 ENCOUNTER — Inpatient Hospital Stay: Payer: BC Managed Care – PPO | Attending: Oncology | Admitting: Oncology

## 2019-04-28 ENCOUNTER — Other Ambulatory Visit: Payer: Self-pay

## 2019-04-28 ENCOUNTER — Ambulatory Visit: Payer: BC Managed Care – PPO | Attending: General Surgery | Admitting: Physical Therapy

## 2019-04-28 ENCOUNTER — Inpatient Hospital Stay: Payer: BC Managed Care – PPO

## 2019-04-28 ENCOUNTER — Encounter: Payer: Self-pay | Admitting: Physical Therapy

## 2019-04-28 ENCOUNTER — Ambulatory Visit (HOSPITAL_COMMUNITY): Payer: Self-pay | Admitting: General Surgery

## 2019-04-28 ENCOUNTER — Ambulatory Visit
Admission: RE | Admit: 2019-04-28 | Discharge: 2019-04-28 | Disposition: A | Discharge status: Home / Self Care | Payer: BC Managed Care – PPO | Source: Ambulatory Visit | Attending: Radiation Oncology | Admitting: Radiation Oncology

## 2019-04-28 VITALS — BP 135/69 | HR 75 | Temp 98.2°F | Resp 20 | Ht 70.0 in | Wt 200.4 lb

## 2019-04-28 DIAGNOSIS — Z170 Estrogen receptor positive status [ER+]: Secondary | ICD-10-CM

## 2019-04-28 DIAGNOSIS — R293 Abnormal posture: Secondary | ICD-10-CM | POA: Insufficient documentation

## 2019-04-28 DIAGNOSIS — C50212 Malignant neoplasm of upper-inner quadrant of left female breast: Secondary | ICD-10-CM | POA: Insufficient documentation

## 2019-04-28 LAB — CBC WITH DIFFERENTIAL (CANCER CENTER ONLY)
Abs Immature Granulocytes: 0.01 10*3/uL (ref 0.00–0.07)
Basophils Absolute: 0 10*3/uL (ref 0.0–0.1)
Basophils Relative: 1 %
Eosinophils Absolute: 0.3 10*3/uL (ref 0.0–0.5)
Eosinophils Relative: 4 %
HCT: 42.5 % (ref 36.0–46.0)
Hemoglobin: 13.9 g/dL (ref 12.0–15.0)
Immature Granulocytes: 0 %
Lymphocytes Relative: 22 %
Lymphs Abs: 1.5 10*3/uL (ref 0.7–4.0)
MCH: 29.6 pg (ref 26.0–34.0)
MCHC: 32.7 g/dL (ref 30.0–36.0)
MCV: 90.6 fL (ref 80.0–100.0)
Monocytes Absolute: 0.5 10*3/uL (ref 0.1–1.0)
Monocytes Relative: 7 %
Neutro Abs: 4.5 10*3/uL (ref 1.7–7.7)
Neutrophils Relative %: 66 %
Platelet Count: 299 10*3/uL (ref 150–400)
RBC: 4.69 MIL/uL (ref 3.87–5.11)
RDW: 13.1 % (ref 11.5–15.5)
WBC Count: 6.8 10*3/uL (ref 4.0–10.5)
nRBC: 0 % (ref 0.0–0.2)

## 2019-04-28 LAB — CMP (CANCER CENTER ONLY)
ALT: 17 U/L (ref 0–44)
AST: 17 U/L (ref 15–41)
Albumin: 3.9 g/dL (ref 3.5–5.0)
Alkaline Phosphatase: 81 U/L (ref 38–126)
Anion gap: 8 (ref 5–15)
BUN: 17 mg/dL (ref 8–23)
CO2: 29 mmol/L (ref 22–32)
Calcium: 9 mg/dL (ref 8.9–10.3)
Chloride: 104 mmol/L (ref 98–111)
Creatinine: 0.86 mg/dL (ref 0.44–1.00)
GFR, Est AFR Am: >60 mL/min (ref >60)
GFR, Estimated: >60 mL/min (ref >60)
Glucose, Bld: 72 mg/dL (ref 70–99)
Potassium: 3.7 mmol/L (ref 3.5–5.1)
Sodium: 141 mmol/L (ref 135–145)
Total Bilirubin: 0.4 mg/dL (ref 0.3–1.2)
Total Protein: 6.7 g/dL (ref 6.5–8.1)

## 2019-04-28 LAB — GENETIC SCREENING ORDER

## 2019-04-28 NOTE — Patient Instructions (Signed)

## 2019-04-28 NOTE — Progress Notes (Signed)
Radiation Oncology         [phone] ________________________________  Multidisciplinary Breast Oncology Clinic [facility]) Initial Outpatient Consultation  Name: Lauren Osborne MRN: [id number]  Date: 04/28/2019  DOB: 1955-12-15  FH:[id number], Ranell Patrick, MD  Jovita Kussmaul, MD   REFERRING PHYSICIAN: Autumn Messing III, MD  DIAGNOSIS: The encounter diagnosis was Malignant neoplasm of upper-inner quadrant of left breast in female, estrogen receptor positive ([place]).  Stage IA Left Breast UOQ, Invasive Ductal Carcinoma with DCIS, ER+ / PR+ / Her2 -, Grade 2    ICD-10-CM   1. Malignant neoplasm of upper-inner quadrant of left breast in female, estrogen receptor positive ([place])  C50.212    Z17.0     HISTORY OF PRESENT ILLNESS::Lauren Osborne is a 63 y.o. female who is presenting to the office today for evaluation of her newly diagnosed breast cancer. She is accompanied by no one due to Covid 19 restrictions.   She had routine screening mammography on 04/14/2019 showing possible asymmetry in the right breast and possible distortion and asymmetry in the left breast. She underwent bilateral diagnostic mammography with tomography and left breast ultrasonography at [facility] on 04/20/2019 showing 0.5cm suspicious mass in left breast at 11 o'clock. Targeted ultrasound of the left axilla did not demonstrate any abnormal appearing lymph nodes. Fibroglandular tissue was seen in the right breast without persistent mass or distortion.  Biopsy on 04/22/2019 showed invasive ductal carcinoma with DCIS, grade 2. Prognostic indicators significant for: estrogen receptor, 95% positive with strong staining intensity and progesterone receptor, 50% positive with moderate staining intensity. Proliferation marker Ki67 at 5%. HER2 negative.  Menarche: 63 years old Age at first live birth: 63 years old GP: 2 LMP: Approximately 15 years ago Contraceptive: Yes, for approximately six months HRT: No    The patient was referred today for presentation in the multidisciplinary conference. Radiology studies and pathology slides were presented there for review and discussion of treatment options. A consensus was discussed regarding potential next steps.  PREVIOUS RADIATION THERAPY: No  PAST MEDICAL HISTORY:  Past Medical History:  Diagnosis Date   Abnormal Pap smear of cervix 12/2009   ASCUS cannot exclude HGSIL.   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 From [facility]     PAST SURGICAL HISTORY: Past Surgical History:  Procedure Laterality Date   BUNIONECTOMY Right    COLPOSCOPY  12/2009   negative   FOOT SURGERY Right    bone removed    hardward removal from right foot  12/15   Dr. Wardell Honour   JOINT REPLACEMENT Right 2001   Hip replacement    Posterior vaginal repair  10/21/2012   Procedure: VAGINAL REPAIR POSTERIOR; Surgeon: Daneil Dolin, MD; Location: [facility] MAIN OR; Service: Gynecology;;   SINOSCOPY     TONSILLECTOMY     UTERINE SUSPENSION  10/21/12   Procedure: UTEROSACRAL SUSPENSION; Surgeon: Daneil Dolin, MD; Location: [place]; Service: Gynecology;;   VAGINAL HYSTERECTOMY  10/21/12   Procedure: HYSTERECTOMY VAGINAL; Surgeon: Daneil Dolin, MD; Location: [place]; Service: Gynecology; Laterality: N/A; bilateral salpingectomy    FAMILY HISTORY:  Family History  Problem Relation Age of Onset   Rheum arthritis Mother    Cancer  Father    Osteoarthritis Sister    Osteoarthritis Brother    Crohn's disease Son    Allergic rhinitis Neg Hx    Asthma Neg Hx    Eczema Neg Hx    Urticaria Neg Hx     SOCIAL HISTORY:  Social History   Socioeconomic History   Marital status: Married     Spouse name: Not on file   Number of children: Not on file   Years of education: Not on file   Highest education level: Not on file  Occupational History   Not on file  Tobacco Use   Smoking status: Never Smoker   Smokeless tobacco: Never Used  Substance and Sexual Activity   Alcohol use: Yes    Alcohol/week: 4.0 standard drinks    Types: 4 Glasses of wine per week    Comment: occ   Drug use: Never   Sexual activity: Yes    Comment: Hysterectomy   Other Topics Concern   Not on file  Social History Narrative   Not on file   Social Determinants of Health   Financial Resource Strain:    Difficulty of Paying Living Expenses: Not on file  Food Insecurity:    Worried About [place] in the Last Year: Not on file   Ran Out of Food in the Last Year: Not on file  Transportation Needs:    Lack of Transportation (Medical): Not on file   Lack of Transportation (Non-Medical): Not on file  Physical Activity:    Days of Exercise per Week: Not on file   Minutes of Exercise per Session: Not on file  Stress:    Feeling of Stress : Not on file  Social Connections:    Frequency of Communication with Friends and Family: Not on file   Frequency of Social Gatherings with Friends and Family: Not on file   Attends Religious Services: Not on file   Active Member of Clubs or Organizations: Not on file   Attends Archivist Meetings: Not on file   Marital Status: Not on file    ALLERGIES:  Allergies  Allergen Reactions   Cephalexin Diarrhea   Ciprofloxacin Diarrhea   Sulfa Antibiotics Other (See Comments)    Stomach cramps and diarrhea    MEDICATIONS:  Current Outpatient Medications  Medication Sig Dispense Refill   aspirin-acetaminophen-caffeine (EXCEDRIN MIGRAINE) 250-250-65 MG tablet Take by mouth as needed.      azelastine (OPTIVAR) 0.05 % ophthalmic solution Place 1 drop into both eyes 2 (two) times daily. (Patient not taking:  Reported on 04/28/2019) 6 mL 5   diazepam (VALIUM) 10 MG tablet 10 mg at bedtime.     Estradiol (VAGIFEM) 10 MCG TABS vaginal tablet Place 1 tablet (10 mcg total) vaginally 2 (two) times a week. (Patient not taking: Reported on 04/28/2019) 24 tablet 3   fexofenadine (ALLEGRA) 180 MG tablet Take 180 mg by mouth daily.     ibuprofen (ADVIL,MOTRIN) 200 MG tablet Take 200 mg by mouth every 6 (six) hours as needed.     Meth-Hyo-M Bl-Na Phos-Ph Sal (URIBEL) 118 MG CAPS Take 1 capsule (118 mg total) by mouth 4 (four) times daily. 30 capsule 0   omeprazole (PRILOSEC) 20 MG capsule TAKE 1 CAPSULE BY MOUTH EVERY DAY (Patient taking differently: TAKE 1 CAPSULE BY MOUTH EVERY DAY, as needed) 30 capsule 1   solifenacin (VESICARE) 5 MG tablet Take 1 tablet (5 mg total) by mouth as needed. 90 tablet 4  No current facility-administered medications for this encounter.    REVIEW OF SYSTEMS: A 10+ POINT REVIEW OF SYSTEMS WAS OBTAINED including neurology, dermatology, psychiatry, cardiac, respiratory, lymph, extremities, GI, GU, musculoskeletal, constitutional, reproductive, HEENT. On the provided form, she reports fatigue, changes in vision, tinnitus, rhinorrhea, sinus problems, hoarse voice, mouth sores, shortness of breath both at rest and with exertion, dry cough, heartburn, difficulty walking secondary to arthritis and joint pain, and headaches. She denies chest pain and any other symptoms.    PHYSICAL EXAM:  Vitals with BMI 04/28/2019  Height _0   Weight 200 lbs 6 oz  BMI 35.32  Systolic 992  Diastolic 69  Pulse 75   Lungs are clear to auscultation bilaterally. Heart has regular rate and rhythm. No palpable cervical, supraclavicular, or axillary adenopathy. Abdomen soft, non-tender, normal bowel sounds. Right breast with no palpable mass, nipple discharge, or bleeding.  Left breast with significant bruising in the upper inner quadrant with biopsy induration noted. No obvious palpable mass,  nipple discharge, or bleeding.  KPS = 90  100 - Normal; no complaints; no evidence of disease. 90   - Able to carry on normal activity; minor signs or symptoms of disease. 80   - Normal activity with effort; some signs or symptoms of disease. 87   - Cares for self; unable to carry on normal activity or to do active work. 60   - Requires occasional assistance, but is able to care for most of his personal needs. 50   - Requires considerable assistance and frequent medical care. 61   - Disabled; requires special care and assistance. 39   - Severely disabled; hospital admission is indicated although death not imminent. 65   - Very sick; hospital admission necessary; active supportive treatment necessary. 10   - Moribund; fatal processes progressing rapidly. 0     - Dead  Karnofsky DA, Abelmann [place], Craver LS and Burchenal [facility] [phone]) The use of the nitrogen mustards in the palliative treatment of carcinoma: with particular reference to bronchogenic carcinoma Cancer 1 634-56  LABORATORY DATA:  Lab Results  Component Value Date   WBC 6.8 04/28/2019   HGB 13.9 04/28/2019   HCT 42.5 04/28/2019   MCV 90.6 04/28/2019   PLT 299 04/28/2019   Lab Results  Component Value Date   NA 141 04/28/2019   K 3.7 04/28/2019   CL 104 04/28/2019   CO2 29 04/28/2019   Lab Results  Component Value Date   ALT 17 04/28/2019   AST 17 04/28/2019   ALKPHOS 81 04/28/2019   BILITOT 0.4 04/28/2019    PULMONARY FUNCTION TEST:   Recent Review Flowsheet Data    There is no flowsheet data to display.      RADIOGRAPHY: US BREAST LTD UNI LEFT INC AXILLA  Result Date: 04/20/2019 CLINICAL DATA:  Patient presents as a recall from screen for possible right breast asymmetry as well as possible left breast mass and separate left breast asymmetry. EXAM: DIGITAL DIAGNOSTIC BILATERAL MAMMOGRAM WITH TOMO ULTRASOUND LEFT BREAST COMPARISON:  Previous exam(s). ACR Breast Density Category b: There are scattered areas of  fibroglandular density. FINDINGS: Mammogram: Right breast: Additional spot compression tomosynthesis views were performed for the questioned asymmetry in the outer right breast. On the additional imaging there is fibroglandular tissue without a persistent mass or distortion. Left breast: Additional spot compression tomosynthesis views were performed for the questioned mass in the upper slightly medial left breast. On the additional imaging there is persistence of a 0.5  cm spiculated mass. Additional imaging of this separately questioned asymmetry in the more medial aspect of the left breast demonstrates resolution of the asymmetry. Ultrasound: Targeted ultrasound is performed in the left breast at 11 o'clock 4 cm from the nipple demonstrating an irregular hypoechoic mass with posterior shadowing measuring 0.4 x 0.5 x 0.5 cm. This corresponds to the mammographic finding. Targeted ultrasound of the left axilla demonstrates no abnormal appearing lymph nodes. IMPRESSION: Left breast mass at 11 o'clock measuring 0.5 cm is suspicious. RECOMMENDATION: Stereotactic core needle biopsy of the left breast mass at 11 o'clock. Given the small size of the mass and difficulty in visualizing sonographically without harmonics, stereotactic biopsy is recommended for accurate sampling. I have discussed the findings and recommendations with the patient. If applicable, a reminder letter will be sent to the patient regarding the next appointment. BI-RADS CATEGORY  4: Suspicious. Electronically Signed   By: Audie Pinto M.D.   On: 04/20/2019 12:08   MM DIAG BREAST TOMO BILATERAL  Result Date: 04/20/2019 CLINICAL DATA:  Patient presents as a recall from screen for possible right breast asymmetry as well as possible left breast mass and separate left breast asymmetry. EXAM: DIGITAL DIAGNOSTIC BILATERAL MAMMOGRAM WITH TOMO ULTRASOUND LEFT BREAST COMPARISON:  Previous exam(s). ACR Breast Density Category b: There are scattered areas  of fibroglandular density. FINDINGS: Mammogram: Right breast: Additional spot compression tomosynthesis views were performed for the questioned asymmetry in the outer right breast. On the additional imaging there is fibroglandular tissue without a persistent mass or distortion. Left breast: Additional spot compression tomosynthesis views were performed for the questioned mass in the upper slightly medial left breast. On the additional imaging there is persistence of a 0.5 cm spiculated mass. Additional imaging of this separately questioned asymmetry in the more medial aspect of the left breast demonstrates resolution of the asymmetry. Ultrasound: Targeted ultrasound is performed in the left breast at 11 o'clock 4 cm from the nipple demonstrating an irregular hypoechoic mass with posterior shadowing measuring 0.4 x 0.5 x 0.5 cm. This corresponds to the mammographic finding. Targeted ultrasound of the left axilla demonstrates no abnormal appearing lymph nodes. IMPRESSION: Left breast mass at 11 o'clock measuring 0.5 cm is suspicious. RECOMMENDATION: Stereotactic core needle biopsy of the left breast mass at 11 o'clock. Given the small size of the mass and difficulty in visualizing sonographically without harmonics, stereotactic biopsy is recommended for accurate sampling. I have discussed the findings and recommendations with the patient. If applicable, a reminder letter will be sent to the patient regarding the next appointment. BI-RADS CATEGORY  4: Suspicious. Electronically Signed   By: Audie Pinto M.D.   On: 04/20/2019 12:08   MM 3D SCREEN BREAST BILATERAL  Result Date: 04/14/2019 CLINICAL DATA:  Screening. EXAM: DIGITAL SCREENING BILATERAL MAMMOGRAM WITH TOMO AND CAD COMPARISON:  Previous exam(s). ACR Breast Density Category b: There are scattered areas of fibroglandular density. FINDINGS: In the right breast , a possible asymmetry requires further evaluation. In the left breast , possible distortion  and asymmetry requires further evaluation. Images were processed with CAD. IMPRESSION: Further evaluation is suggested for possible asymmetry in the right breast. Further evaluation is suggested for possible distortion and asymmetry in the left breast. RECOMMENDATION: Diagnostic mammogram and possibly ultrasound of both breasts. (Code:[id number]) The patient will be contacted regarding the findings, and additional imaging will be scheduled. BI-RADS CATEGORY  0: Incomplete. Need additional imaging evaluation and/or prior mammograms for comparison. Electronically Signed   By: Shayne Alken.D.  On: 04/14/2019 12:11   MM CLIP PLACEMENT LEFT  Result Date: 04/22/2019 CLINICAL DATA:  Patient underwent stereotactic biopsy of a left breast mass. EXAM: DIAGNOSTIC LEFT MAMMOGRAM POST STEREOTACTIC BIOPSY COMPARISON:  Previous exam(s). FINDINGS: Mammographic images were obtained following stereotactic guided biopsy of a left breast mass at 11 o'clock. The biopsy marking clip is in expected position at the site of biopsy. IMPRESSION: Appropriate positioning of the coil shaped biopsy marking clip at the site of biopsy in the left upper inner breast. Final Assessment: Post Procedure Mammograms for Marker Placement Electronically Signed   By: Audie Pinto M.D.   On: 04/22/2019 10:01   MM LT BREAST BX W LOC DEV 1ST LESION IMAGE BX SPEC STEREO GUIDE  Addendum Date: 04/23/2019   ADDENDUM REPORT: 04/23/2019 15:03 ADDENDUM: Upon reconsideration , the patient decided she would like to be referred to [facility] on April 28, 2019. Stacie Acres, RN on 04/23/2019. Electronically Signed   By: Audie Pinto M.D.   On: 04/23/2019 15:03   Addendum Date: 04/23/2019   ADDENDUM REPORT: 04/23/2019 13:48 ADDENDUM: Pathology revealed GRADE II INVASIVE DUCTAL CARCINOMA, DUCTAL CARCINOMA IN SITU of the LEFT breast, 11 o'clock. This was found to be  concordant by Dr. Audie Pinto. Pathology results were discussed with the patient by telephone. The patient reported doing well after the biopsy with tenderness at the site. Post biopsy instructions and care were reviewed and questions were answered. The patient was encouraged to call [facility] for any additional concerns. Dr. Hale Bogus of [place] spoke with patient regarding pathology results and surgical referral. At the request of the patient, her follow up care after biopsy will proceed at [facility]. [place] was contacted by Glorianne Manchester RN on April 23, 2019 to facilitate process. Pathology results reported by Stacie Acres, RN on 04/23/2019. Electronically Signed   By: Audie Pinto M.D.   On: 04/23/2019 13:48   Result Date: 04/23/2019 CLINICAL DATA:  Patient presents for biopsy of a left breast mass. EXAM: LEFT BREAST STEREOTACTIC CORE NEEDLE BIOPSY COMPARISON:  Previous exams. FINDINGS: The patient and I discussed the procedure of stereotactic-guided biopsy including benefits and alternatives. We discussed the high likelihood of a successful procedure. We discussed the risks of the procedure including infection, bleeding, tissue injury, clip migration, and inadequate sampling. Informed written consent was given. The usual time out protocol was performed immediately prior to the procedure. Using sterile technique and 1% Lidocaine as local anesthetic, under stereotactic guidance, a 9 gauge vacuum assisted device was used to perform core needle biopsy of a mass in the upper inner quadrant of the left breast using a superior approach. Lesion quadrant: Upper inner quadrant At the conclusion of the procedure, a coil tissue marker clip was deployed into the biopsy cavity. Follow-up 2-view mammogram was performed and dictated separately. IMPRESSION: Stereotactic-guided biopsy of a left breast mass at 11 o'clock. There was a small post  biopsy hematoma. Electronically Signed: By: Audie Pinto M.D. On: 04/22/2019 10:03      IMPRESSION: Stage IA Left Breast UOQ, Invasive Ductal Carcinoma in Situ, ER+ / PR+ / Her2 -, Grade 2  Patient will be a good candidate for breast conservation with radiotherapy to left breast. We discussed the general course of radiation, potential side effects, and toxicities with radiation and the patient is interested in this approach.   PLAN:  1. Breast conservation  surgery; left lumpectomy with sentinel node biopsy 2. Adjuvant radiation therapy 3. Aromatase inhibitor   ------------------------------------------------  Blair Promise, PhD, MD  This document serves as a record of services personally performed by [name], MD. It was created on his behalf by Clerance Lav, a trained medical scribe. The creation of this record is based on the scribe's personal observations and the provider's statements to them. This document has been checked and approved by the attending provider.

## 2019-04-28 NOTE — Therapy (Signed)
[place], [name], [address] Phone: [phone]   Fax:  [phone]  Physical Therapy Evaluation  Patient Details  Name: Lauren Osborne MRN: [id number] Date of Birth: December 29, 1955 Referring Provider (PT): Dr. Autumn Messing   Encounter Date: 04/28/2019  PT End of Session - 04/28/19 1210    Visit Number  1    Number of Visits  2    Date for PT Re-Evaluation  06/23/19    PT Start Time  1497    PT Stop Time  1105   Also saw pt from 1136-1155 for a total of 42 minutes   PT Time Calculation (min)  23 min    Activity Tolerance  Patient tolerated treatment well    Behavior During Therapy  [facility] for tasks assessed/performed       Past Medical History:  Diagnosis Date  . Abnormal Pap smear of cervix 12/2009   ASCUS cannot exclude HGSIL.  [name] Arthralgia of right hip 2015   Hip replacement   . Cystocele or rectocele with uterine prolapse   . Headache   . IBS (irritable bowel syndrome)   . Osteoarthritis   . Rectocele   . 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 From [facility]     Past Surgical History:  Procedure Laterality Date  . BUNIONECTOMY Right   . COLPOSCOPY  12/2009   negative  . FOOT SURGERY Right    bone removed   . hardward removal from right foot  12/15   Dr. Wardell Honour  . JOINT REPLACEMENT Right 2001   Hip replacement   . Posterior vaginal repair  10/21/2012   Procedure: VAGINAL REPAIR POSTERIOR; Surgeon: Daneil Dolin, MD; Location: [place]; Service: Gynecology;;  . SINOSCOPY    . TONSILLECTOMY    . UTERINE SUSPENSION  10/21/12   Procedure: UTEROSACRAL SUSPENSION; Surgeon: Daneil Dolin, MD; Location: [facility] MAIN OR; Service: Gynecology;;  . VAGINAL HYSTERECTOMY  10/21/12   Procedure: HYSTERECTOMY VAGINAL; Surgeon: Daneil Dolin, MD; Location: [place]; Service: Gynecology; Laterality: N/A; bilateral  salpingectomy    There were no vitals filed for this visit.   Subjective Assessment - 04/28/19 0951    Subjective  Patient reports she is here today to be seen by her medical team for her newly diagnosed left breast cancer.    Pertinent History  Patient was diagnosed on 06/14/2018 with left grade II invasive ductal carcinoma breast cancer. It measures 5 mm and is located in the upper inner quadrant. It is ER/PR positive, HER2 negative with a Ki67 of 5%. She also reports a right total hip replacement in 2001 and hx of fibromyalgia and osteoarthritis.    Patient Stated Goals  Reduce lymphedema risk and learn post op shoulder ROM HEP    Currently in Pain?  Yes    Pain Score  4     Pain Location  --   "All over becuase I have fibromyalgia"   Pain Descriptors / Indicators  Aching;Sharp    Pain Type  Chronic pain    Pain Onset  1 to 4 weeks ago    Pain Frequency  Intermittent    Aggravating Factors   Overdoing it    Pain Relieving Factors  Unknown         [facility] PT Assessment - 04/28/19 0001      Assessment   Medical Diagnosis  Left breast cancer    Referring  Provider (PT)  Dr. Autumn Messing    Onset Date/Surgical Date  06/14/18    Hand Dominance  Right    Prior Therapy  none      Precautions   Precautions  Other (comment)    Precaution Comments  active cancer      Restrictions   Weight Bearing Restrictions  No      Balance Screen   Has the patient fallen in the past 6 months  No    Has the patient had a decrease in activity level because of a fear of falling?   No    Is the patient reluctant to leave their home because of a fear of falling?   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    Leisure  She does not exercise      Cognition    Overall Cognitive Status  Within Functional Limits for tasks assessed      Posture/Postural Control   Posture/Postural Control  Postural limitations    Postural Limitations  Rounded Shoulders;Forward head      ROM / Strength   AROM / PROM / Strength  AROM;Strength      AROM   Overall AROM Comments  Cervical AROM is all limited 25% except flexion is WNL    AROM Assessment Site  Shoulder    Right/Left Shoulder  Right;Left    Right Shoulder Extension  47 Degrees    Right Shoulder Flexion  147 Degrees    Right Shoulder ABduction  165 Degrees    Right Shoulder Internal Rotation  70 Degrees    Right Shoulder External Rotation  90 Degrees    Left Shoulder Extension  63 Degrees    Left Shoulder Flexion  149 Degrees    Left Shoulder ABduction  166 Degrees    Left Shoulder Internal Rotation  74 Degrees    Left Shoulder External Rotation  76 Degrees      Strength   Overall Strength  Within functional limits for tasks performed        LYMPHEDEMA/ONCOLOGY QUESTIONNAIRE - 04/28/19 0954      Type   Cancer Type  Left breast cancer      Lymphedema Assessments   Lymphedema Assessments  Upper extremities      Right Upper Extremity Lymphedema   10 cm Proximal to Olecranon Process  31 cm    Olecranon Process  26.6 cm    10 cm Proximal to Ulnar Styloid Process  22.2 cm    Just Proximal to Ulnar Styloid Process  15.7 cm    Across Hand at [employer]  19.4 cm    At [place] of 2nd Digit  5.9 cm      Left Upper Extremity Lymphedema   10 cm Proximal to Olecranon Process  31.4 cm    Olecranon Process  26.7 cm    10 cm Proximal to Ulnar Styloid Process  21.2 cm    Just Proximal to Ulnar Styloid Process  16.1 cm    Across Hand at [employer]  19.2 cm    At [place] of 2nd Digit  5.8 cm  Katina Dung - 04/28/19 0001    Open a tight or new jar  Severe difficulty    Do heavy household chores (wash walls, wash floors)  Mild difficulty    Carry a shopping bag or briefcase  Mild  difficulty    Wash your back  Mild difficulty    Use a knife to cut food  No difficulty    Recreational activities in which you take some force or impact through your arm, shoulder, or hand (golf, hammering, tennis)  Moderate difficulty    During the past week, to what extent has your arm, shoulder or hand problem interfered with your normal social activities with family, friends, neighbors, or groups?  Slightly    During the past week, to what extent has your arm, shoulder or hand problem limited your work or other regular daily activities  Slightly    Arm, shoulder, or hand pain.  Moderate    Tingling (pins and needles) in your arm, shoulder, or hand  Moderate    Difficulty Sleeping  No difficulty    DASH Score  31.82 %        Objective measurements completed on examination: See above findings.         Patient was instructed today in a home exercise program today for post op shoulder range of motion. These included active assist shoulder flexion in sitting, scapular retraction, wall walking with shoulder abduction, and hands behind head external rotation.  She was encouraged to do these twice a day, holding 3 seconds and repeating 5 times when permitted by her physician.         PT Education - 04/28/19 0955    Education Details  Lymphedema risk reduction and post op shoulder ROM HEP    Person(s) Educated  Patient    Methods  Explanation;Demonstration;Handout    Comprehension  Returned demonstration;Verbalized understanding          PT Long Term Goals - 04/28/19 1213      PT LONG TERM GOAL #1   Title  Patient will demonstrate she has regained full shoulder ROM and function post operatively compared to baseline assessments.    Time  8    Period  Weeks    Status  New    Target Date  06/23/19      Breast Clinic Goals - 04/28/19 1213      Patient will be able to verbalize understanding of pertinent lymphedema risk reduction practices relevant to her diagnosis  specifically related to skin care.   Time  1    Period  Days    Status  Achieved      Patient will be able to return demonstrate and/or verbalize understanding of the post-op home exercise program related to regaining shoulder range of motion.   Time  1    Period  Days    Status  Achieved      Patient will be able to verbalize understanding of the importance of attending the postoperative After Breast Cancer Class for further lymphedema risk reduction education and therapeutic exercise.   Time  1    Period  Days    Status  Achieved            Plan - 04/28/19 1211    Clinical Impression Statement  Patient was diagnosed on 06/14/2018 with left grade II invasive ductal carcinoma breast cancer. It measures 5 mm and is located in the upper inner quadrant. It is ER/PR positive, HER2 negative with a Ki67  of 5%. She also reports a right total hip replacement in 2001 and hx of fibromyalgia and osteoarthritis.Her multidisciplinary medical team met prior to her assessments to determine a recommended treatment plan. She is planning to have a left lumpectomy and sentinel node biopsy followed by radiation and possibly anti-estrogen therapy. She will benefit from a post op PT reassessment to determine needs.    Stability/Clinical Decision Making  Stable/Uncomplicated    Clinical Decision Making  Low    Rehab Potential  Excellent    PT Frequency  --   Eval and 1 f/u visit   PT Treatment/Interventions  ADLs/Self Care Home Management;Therapeutic exercise;Patient/family education    PT Next Visit Plan  Will reassess 3-4 weeks after surgery to determine needs    PT Home Exercise Plan  Post op shoulder ROM HEP    Consulted and Agree with Plan of Care  Patient       Patient will benefit from skilled therapeutic intervention in order to improve the following deficits and impairments:  Postural dysfunction, Decreased range of motion, Decreased knowledge of precautions, Impaired UE functional use,  Pain  Visit Diagnosis: Malignant neoplasm of upper-inner quadrant of left breast in female, estrogen receptor positive ([place]) - Plan: PT plan of care cert/re-cert  Abnormal posture - Plan: PT plan of care cert/re-cert   Patient will follow up at outpatient cancer rehab 3-4 weeks following surgery.  If the patient requires physical therapy at that time, a specific plan will be dictated and sent to the referring physician for approval. The patient was educated today on appropriate basic range of motion exercises to begin post operatively and the importance of attending the After Breast Cancer class following surgery.  Patient was educated today on lymphedema risk reduction practices as it pertains to recommendations that will benefit the patient immediately following surgery.  She verbalized good understanding.      Problem List Patient Active Problem List   Diagnosis Date Noted  . Malignant neoplasm of upper-inner quadrant of left breast, estrogen receptor positive ([place]) 04/27/2019  . Perennial allergic rhinitis with a predominantly nonallergic component 04/13/2018  . Dyspnea 04/13/2018  . Allergic conjunctivitis 04/13/2018  . GERD (gastroesophageal reflux disease) 11/28/2017  . 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 Urinary frequency 07/07/2015  . Rectal pressure 07/07/2015  . Arthralgia of hip 03/22/2014  . Arthralgia, sacroiliac 03/22/2014  . ANA positive 03/10/2014  . 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 Uterovaginal prolapse 09/17/2012   Annia Friendly, PT 04/28/19 12:15 PM  [place], [name], [address] Phone: [phone]   Fax:  [phone]  Name: Jaala Bohle MRN: [id number] Date of Birth: 09/26/1955

## 2019-04-28 NOTE — Progress Notes (Unsigned)
[facility] Psychosocial Distress Screening Counseling Intern  Counseling Intern was referred by distress screening protocol.  The patient scored a 4 on the Psychosocial Distress Thermometer which indicates moderate distress. Counseling intern met with patient in exam room to assess for distress and other psychosocial needs.   ONCBCN DISTRESS SCREENING 04/28/2019  Distress experienced in past week (1-10) 4  Emotional problem type Adjusting to illness  Spiritual/Religous concerns type Loss of sense of purpose  Referral to support programs Yes   Counseling intern used the Distress Thermometer and Problems List along with clinical interview to assess for emotional and practical support needs. Counseling intern normalized emotional responses to a cancer dx. Patient stated she has a limited support network and that she typically doesn't share personal information with others. Pt is contemplating how to tell her children about her dx and how much to share with them. Additionally, pt reported she does not know any one who has had breast cancer. Counseling intern and patient discussed possible ways to share about her dx and tx that are both realistic and reassuring. Counseling intern will follow-up with the patient on 05/17/2019 to offer support. Counseling intern will touch base with the pt at this time about possibility of getting a Sales executive through [employer].  Clinical Social Worker follow up needed: No.  [facility] [place] Counseling Intern Voicemail:  [phone]

## 2019-04-29 ENCOUNTER — Telehealth: Payer: Self-pay | Admitting: Oncology

## 2019-04-29 NOTE — Telephone Encounter (Signed)
I left a message regarding schedule  

## 2019-04-30 ENCOUNTER — Other Ambulatory Visit (HOSPITAL_COMMUNITY): Payer: Self-pay | Admitting: General Surgery

## 2019-04-30 DIAGNOSIS — Z170 Estrogen receptor positive status [ER+]: Secondary | ICD-10-CM

## 2019-04-30 DIAGNOSIS — C50212 Malignant neoplasm of upper-inner quadrant of left female breast: Secondary | ICD-10-CM

## 2019-05-04 ENCOUNTER — Telehealth: Payer: Self-pay | Admitting: *Deleted

## 2019-05-04 ENCOUNTER — Other Ambulatory Visit: Payer: Self-pay | Admitting: *Deleted

## 2019-05-04 DIAGNOSIS — Z170 Estrogen receptor positive status [ER+]: Secondary | ICD-10-CM

## 2019-05-04 DIAGNOSIS — C50212 Malignant neoplasm of upper-inner quadrant of left female breast: Secondary | ICD-10-CM

## 2019-05-04 NOTE — Telephone Encounter (Signed)
Left message for a return phone call to follow up from [facility] and assess navigation needs. 

## 2019-05-04 NOTE — Progress Notes (Signed)
amb  

## 2019-05-05 ENCOUNTER — Telehealth: Payer: Self-pay

## 2019-05-05 NOTE — Telephone Encounter (Signed)
Nutrition Assessment  Reason for Assessment:  Pt attended  Breast Clinic on 04/28/2019 and was given nutrition packet by nurse navigator  ASSESSMENT:  63 year old female with new diagnosis of breast cancer.  Planning lumpectomy, radiation and antiestrogens.    Spoke with patient via phone to introduce self and service at [facility].    Medications:  reviewed  Labs: reviewed  Anthropometrics:   Height: 60 inches Weight: 200 lb BMI: 28   NUTRITION DIAGNOSIS: Food and nutrition related knowledge deficit related to new diagnosis of breast cancer as evidenced by no prior need for nutrition related information.  INTERVENTION:   Discussed briefly packet of information regarding nutritional tips for breast cancer patients.  Questions answered.  Teachback method used.  Contact information provided and patient knows to contact me with questions/concerns.    MONITORING, EVALUATION, and GOAL: Pt will consume a healthy plant based diet to maintain lean body mass throughout treatment.   Parvin Stetzer B. Zenia Resides, [place], [place] Registered Dietitian [phone] (pager)

## 2019-05-10 ENCOUNTER — Other Ambulatory Visit: Payer: Self-pay

## 2019-05-11 ENCOUNTER — Other Ambulatory Visit: Payer: Self-pay

## 2019-05-12 ENCOUNTER — Other Ambulatory Visit: Payer: Self-pay

## 2019-05-26 ENCOUNTER — Encounter (HOSPITAL_BASED_OUTPATIENT_CLINIC_OR_DEPARTMENT_OTHER): Payer: Self-pay | Admitting: General Surgery

## 2019-05-26 ENCOUNTER — Other Ambulatory Visit: Payer: Self-pay

## 2019-05-31 ENCOUNTER — Other Ambulatory Visit (HOSPITAL_COMMUNITY)
Admission: RE | Admit: 2019-05-31 | Discharge: 2019-05-31 | Disposition: A | Discharge status: Home / Self Care | Payer: BC Managed Care – PPO | Source: Ambulatory Visit | Attending: General Surgery | Admitting: General Surgery

## 2019-05-31 DIAGNOSIS — U071 COVID-19: Secondary | ICD-10-CM | POA: Diagnosis not present

## 2019-05-31 DIAGNOSIS — Z01812 Encounter for preprocedural laboratory examination: Secondary | ICD-10-CM | POA: Insufficient documentation

## 2019-05-31 LAB — SARS CORONAVIRUS 2 (TAT 6-24 HRS): SARS Coronavirus 2: POSITIVE — AB

## 2019-06-01 ENCOUNTER — Telehealth: Payer: Self-pay | Admitting: Nurse Practitioner

## 2019-06-01 ENCOUNTER — Telehealth: Payer: Self-pay | Admitting: Physician Assistant

## 2019-06-01 NOTE — Telephone Encounter (Signed)
Called to Discuss with patient about Covid symptoms and the use of bamlanivimab, a monoclonal antibody infusion for those with mild to moderate Covid symptoms and at a high risk of hospitalization.     Pt is qualified for this infusion at the [facility] infusion center due to co-morbid conditions and/or a member of an at-risk group.     Patient Active Problem List   Diagnosis Date Noted  . Malignant neoplasm of upper-inner quadrant of left breast, estrogen receptor positive ([place]) 04/27/2019  . Perennial allergic rhinitis with a predominantly nonallergic component 04/13/2018  . Dyspnea 04/13/2018  . Allergic conjunctivitis 04/13/2018  . GERD (gastroesophageal reflux disease) 11/28/2017  . 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 Urinary frequency 07/07/2015  . Rectal pressure 07/07/2015  . Arthralgia of hip 03/22/2014  . Arthralgia, sacroiliac 03/22/2014  . ANA positive 03/10/2014  . 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 Uterovaginal prolapse 09/17/2012    Patient declines infusion at this time. States that she is not having any symptoms. Symptoms tier reviewed as well as criteria for ending isolation. Preventative practices reviewed. Patient verbalized understanding.    Patient advised to call back if she does develop symptoms and decides that she does want to get infusion. Callback number to the infusion center given. Patient advised to go to Urgent care or ED with severe symptoms.

## 2019-06-01 NOTE — Progress Notes (Signed)
Patient with positive covid results. Informed Sunday Spillers with Dr. Ethlyn Gallery office.

## 2019-06-02 ENCOUNTER — Inpatient Hospital Stay: Admission: RE | Admit: 2019-06-02 | Payer: BC Managed Care – PPO | Source: Ambulatory Visit

## 2019-06-03 ENCOUNTER — Ambulatory Visit (HOSPITAL_COMMUNITY): Payer: BC Managed Care – PPO

## 2019-06-10 ENCOUNTER — Encounter: Payer: Self-pay | Admitting: *Deleted

## 2019-06-17 ENCOUNTER — Ambulatory Visit: Payer: BC Managed Care – PPO

## 2019-06-17 ENCOUNTER — Ambulatory Visit: Payer: BC Managed Care – PPO | Admitting: Radiation Oncology

## 2019-06-24 ENCOUNTER — Ambulatory Visit: Payer: BC Managed Care – PPO | Admitting: Physical Therapy

## 2019-06-24 ENCOUNTER — Other Ambulatory Visit: Payer: Self-pay

## 2019-06-24 ENCOUNTER — Encounter (HOSPITAL_BASED_OUTPATIENT_CLINIC_OR_DEPARTMENT_OTHER): Payer: Self-pay | Admitting: General Surgery

## 2019-06-29 ENCOUNTER — Other Ambulatory Visit (HOSPITAL_COMMUNITY): Admission: RE | Admit: 2019-06-29 | Payer: BC Managed Care – PPO | Source: Ambulatory Visit

## 2019-06-29 NOTE — Progress Notes (Signed)
Patient was positive for covid 4 weeks ago no retest needed before procedure on 2/19

## 2019-06-30 NOTE — Progress Notes (Signed)

## 2019-07-01 ENCOUNTER — Ambulatory Visit
Admission: RE | Admit: 2019-07-01 | Discharge: 2019-07-01 | Disposition: A | Discharge status: Home / Self Care | Payer: BC Managed Care – PPO | Source: Ambulatory Visit | Attending: General Surgery | Admitting: General Surgery

## 2019-07-01 ENCOUNTER — Other Ambulatory Visit: Payer: Self-pay

## 2019-07-01 DIAGNOSIS — C50212 Malignant neoplasm of upper-inner quadrant of left female breast: Secondary | ICD-10-CM

## 2019-07-01 DIAGNOSIS — Z170 Estrogen receptor positive status [ER+]: Secondary | ICD-10-CM

## 2019-07-02 ENCOUNTER — Other Ambulatory Visit: Payer: Self-pay

## 2019-07-02 ENCOUNTER — Encounter (HOSPITAL_BASED_OUTPATIENT_CLINIC_OR_DEPARTMENT_OTHER): Payer: Self-pay | Admitting: General Surgery

## 2019-07-02 ENCOUNTER — Ambulatory Visit (HOSPITAL_BASED_OUTPATIENT_CLINIC_OR_DEPARTMENT_OTHER)
Admission: RE | Admit: 2019-07-02 | Discharge: 2019-07-02 | Disposition: A | Discharge status: Home / Self Care | Payer: BC Managed Care – PPO | Attending: General Surgery | Admitting: General Surgery

## 2019-07-02 ENCOUNTER — Ambulatory Visit
Admission: RE | Admit: 2019-07-02 | Discharge: 2019-07-02 | Disposition: A | Discharge status: Home / Self Care | Payer: BC Managed Care – PPO | Source: Ambulatory Visit | Attending: General Surgery | Admitting: General Surgery

## 2019-07-02 ENCOUNTER — Ambulatory Visit (HOSPITAL_BASED_OUTPATIENT_CLINIC_OR_DEPARTMENT_OTHER): Payer: BC Managed Care – PPO | Admitting: Anesthesiology

## 2019-07-02 ENCOUNTER — Encounter (HOSPITAL_BASED_OUTPATIENT_CLINIC_OR_DEPARTMENT_OTHER)
Admission: RE | Disposition: A | Discharge status: Home / Self Care | Payer: Self-pay | Source: Home / Self Care | Attending: General Surgery

## 2019-07-02 ENCOUNTER — Ambulatory Visit (HOSPITAL_COMMUNITY)
Admission: RE | Admit: 2019-07-02 | Discharge: 2019-07-02 | Disposition: A | Discharge status: Home / Self Care | Payer: BC Managed Care – PPO | Source: Ambulatory Visit | Attending: General Surgery | Admitting: General Surgery

## 2019-07-02 DIAGNOSIS — C50212 Malignant neoplasm of upper-inner quadrant of left female breast: Secondary | ICD-10-CM

## 2019-07-02 DIAGNOSIS — Z170 Estrogen receptor positive status [ER+]: Secondary | ICD-10-CM

## 2019-07-02 HISTORY — DX: Other specified postprocedural states: Z98.890

## 2019-07-02 HISTORY — PX: BREAST LUMPECTOMY: SHX2

## 2019-07-02 HISTORY — PX: BREAST LUMPECTOMY WITH RADIOACTIVE SEED AND SENTINEL LYMPH NODE BIOPSY: SHX6550

## 2019-07-02 HISTORY — DX: Other specified postprocedural states: R11.2

## 2019-07-02 HISTORY — DX: Other complications of anesthesia, initial encounter: T88.59XA

## 2019-07-02 HISTORY — DX: Other allergic rhinitis: J30.89

## 2019-07-02 SURGERY — BREAST LUMPECTOMY WITH RADIOACTIVE SEED AND SENTINEL LYMPH NODE BIOPSY
Anesthesia: General | Site: Breast | Laterality: Left

## 2019-07-02 MED ORDER — MIDAZOLAM HCL 2 MG/2ML IJ SOLN
INTRAMUSCULAR | Status: AC
  Filled 2019-07-02: qty 2

## 2019-07-02 MED ORDER — GABAPENTIN 300 MG PO CAPS
ORAL_CAPSULE | ORAL | Status: AC
  Filled 2019-07-02: qty 1

## 2019-07-02 MED ORDER — BUPIVACAINE-EPINEPHRINE (PF) 0.5% -1:200000 IJ SOLN
INTRAMUSCULAR | Status: DC | PRN
  Administered 2019-07-02: 30 mL

## 2019-07-02 MED ORDER — DEXAMETHASONE SODIUM PHOSPHATE 4 MG/ML IJ SOLN
INTRAMUSCULAR | Status: DC | PRN
  Administered 2019-07-02: 10 mg via INTRAVENOUS

## 2019-07-02 MED ORDER — HYDROMORPHONE HCL 1 MG/ML IJ SOLN
0.2500 mg | INTRAMUSCULAR | Status: DC | PRN
  Administered 2019-07-02: 0.25 mg via INTRAVENOUS

## 2019-07-02 MED ORDER — CHLORHEXIDINE GLUCONATE CLOTH 2 % EX PADS: 6.0000 | MEDICATED_PAD | Freq: Once | CUTANEOUS | Status: DC

## 2019-07-02 MED ORDER — HYDROCODONE-ACETAMINOPHEN 5-325 MG PO TABS: 1.0000 | ORAL_TABLET | Freq: Four times a day (QID) | ORAL | 0 refills | Status: DC | PRN

## 2019-07-02 MED ORDER — VANCOMYCIN HCL IN DEXTROSE 1-5 GM/200ML-% IV SOLN
INTRAVENOUS | Status: AC
  Filled 2019-07-02: qty 200

## 2019-07-02 MED ORDER — LIDOCAINE 2% (20 MG/ML) 5 ML SYRINGE
INTRAMUSCULAR | Status: DC | PRN
  Administered 2019-07-02: 100 mg via INTRAVENOUS

## 2019-07-02 MED ORDER — LACTATED RINGERS IV SOLN: INTRAVENOUS | Status: DC

## 2019-07-02 MED ORDER — ONDANSETRON HCL 4 MG/2ML IJ SOLN
4.0000 mg | Freq: Once | INTRAMUSCULAR | Status: AC | PRN
  Administered 2019-07-02: 4 mg via INTRAVENOUS

## 2019-07-02 MED ORDER — GABAPENTIN 300 MG PO CAPS
300.0000 mg | ORAL_CAPSULE | ORAL | Status: AC
  Administered 2019-07-02: 300 mg via ORAL

## 2019-07-02 MED ORDER — HYDROMORPHONE HCL 1 MG/ML IJ SOLN
INTRAMUSCULAR | Status: AC
  Filled 2019-07-02: qty 0.5

## 2019-07-02 MED ORDER — VANCOMYCIN HCL IN DEXTROSE 1-5 GM/200ML-% IV SOLN
1000.0000 mg | INTRAVENOUS | Status: AC
  Administered 2019-07-02: 1000 mg via INTRAVENOUS

## 2019-07-02 MED ORDER — MIDAZOLAM HCL 2 MG/2ML IJ SOLN
1.0000 mg | INTRAMUSCULAR | Status: DC | PRN
  Administered 2019-07-02: 2 mg via INTRAVENOUS

## 2019-07-02 MED ORDER — FENTANYL CITRATE (PF) 100 MCG/2ML IJ SOLN
50.0000 ug | INTRAMUSCULAR | Status: DC | PRN
  Administered 2019-07-02: 100 ug via INTRAVENOUS

## 2019-07-02 MED ORDER — FENTANYL CITRATE (PF) 100 MCG/2ML IJ SOLN
INTRAMUSCULAR | Status: AC
  Filled 2019-07-02: qty 2

## 2019-07-02 MED ORDER — ACETAMINOPHEN 500 MG PO TABS
1000.0000 mg | ORAL_TABLET | ORAL | Status: AC
  Administered 2019-07-02: 1000 mg via ORAL

## 2019-07-02 MED ORDER — ONDANSETRON HCL 4 MG/2ML IJ SOLN
INTRAMUSCULAR | Status: DC | PRN
  Administered 2019-07-02: 4 mg via INTRAVENOUS

## 2019-07-02 MED ORDER — PHENYLEPHRINE 40 MCG/ML (10ML) SYRINGE FOR IV PUSH (FOR BLOOD PRESSURE SUPPORT)
PREFILLED_SYRINGE | INTRAVENOUS | Status: AC
  Filled 2019-07-02: qty 10

## 2019-07-02 MED ORDER — PROMETHAZINE HCL 25 MG/ML IJ SOLN
INTRAMUSCULAR | Status: AC
  Filled 2019-07-02: qty 1

## 2019-07-02 MED ORDER — EPHEDRINE SULFATE 50 MG/ML IJ SOLN
INTRAMUSCULAR | Status: DC | PRN
  Administered 2019-07-02: 10 mg via INTRAVENOUS

## 2019-07-02 MED ORDER — MIDAZOLAM HCL 5 MG/5ML IJ SOLN
INTRAMUSCULAR | Status: DC | PRN
  Administered 2019-07-02: 2 mg via INTRAVENOUS

## 2019-07-02 MED ORDER — BUPIVACAINE HCL (PF) 0.25 % IJ SOLN
INTRAMUSCULAR | Status: DC | PRN
  Administered 2019-07-02: 25 mL

## 2019-07-02 MED ORDER — PROMETHAZINE HCL 25 MG/ML IJ SOLN
6.2500 mg | Freq: Once | INTRAMUSCULAR | Status: AC
  Administered 2019-07-02: 6.25 mg via INTRAVENOUS

## 2019-07-02 MED ORDER — ONDANSETRON HCL 4 MG/2ML IJ SOLN
INTRAMUSCULAR | Status: AC
  Filled 2019-07-02: qty 2

## 2019-07-02 MED ORDER — PROPOFOL 10 MG/ML IV BOLUS
INTRAVENOUS | Status: DC | PRN
  Administered 2019-07-02: 150 mg via INTRAVENOUS

## 2019-07-02 MED ORDER — TECHNETIUM TC 99M SULFUR COLLOID FILTERED
1.0000 | Freq: Once | INTRAVENOUS | Status: AC | PRN
  Administered 2019-07-02: 1 via INTRADERMAL

## 2019-07-02 MED ORDER — ACETAMINOPHEN 500 MG PO TABS
ORAL_TABLET | ORAL | Status: AC
  Filled 2019-07-02: qty 2

## 2019-07-02 MED ORDER — MEPERIDINE HCL 25 MG/ML IJ SOLN: 6.2500 mg | INTRAMUSCULAR | Status: DC | PRN

## 2019-07-02 MED ORDER — 0.9 % SODIUM CHLORIDE (POUR BTL) OPTIME
TOPICAL | Status: DC | PRN
  Administered 2019-07-02: 100 mL

## 2019-07-02 SURGICAL SUPPLY — 52 items
ADH SKN CLS APL DERMABOND .7 (GAUZE/BANDAGES/DRESSINGS) ×1
APL PRP STRL LF DISP 70% ISPRP (MISCELLANEOUS) ×1
APPLIER CLIP 9.375 MED OPEN (MISCELLANEOUS)
APR CLP MED 9.3 20 MLT OPN (MISCELLANEOUS)
BLADE SURG 15 STRL LF DISP TIS (BLADE) ×1 IMPLANT
BLADE SURG 15 STRL SS (BLADE) ×2
CANISTER SUC SOCK COL 7IN (MISCELLANEOUS) IMPLANT
CANISTER SUCT 1200ML W/VALVE (MISCELLANEOUS) ×1 IMPLANT
CHLORAPREP W/TINT 26 (MISCELLANEOUS) ×2 IMPLANT
CLIP APPLIE 9.375 MED OPEN (MISCELLANEOUS) ×1 IMPLANT
COVER BACK TABLE 60X90IN (DRAPES) ×2 IMPLANT
COVER MAYO STAND STRL (DRAPES) ×3 IMPLANT
COVER PROBE W GEL 5X96 (DRAPES) ×2 IMPLANT
COVER WAND RF STERILE (DRAPES) IMPLANT
DECANTER SPIKE VIAL GLASS SM (MISCELLANEOUS) IMPLANT
DERMABOND ADVANCED (GAUZE/BANDAGES/DRESSINGS) ×1
DERMABOND ADVANCED .7 DNX12 (GAUZE/BANDAGES/DRESSINGS) ×1 IMPLANT
DRAPE LAPAROSCOPIC ABDOMINAL (DRAPES) ×2 IMPLANT
DRAPE UTILITY XL STRL (DRAPES) ×2 IMPLANT
ELECT COATED BLADE 2.86 ST (ELECTRODE) ×2 IMPLANT
ELECT REM PT RETURN 9FT ADLT (ELECTROSURGICAL) ×2
ELECTRODE REM PT RTRN 9FT ADLT (ELECTROSURGICAL) ×1 IMPLANT
GLOVE BIO SURGEON STRL SZ7.5 (GLOVE) ×2 IMPLANT
GLOVE BIOGEL PI IND STRL 7.0 (GLOVE) IMPLANT
GLOVE BIOGEL PI IND STRL 7.5 (GLOVE) IMPLANT
GLOVE BIOGEL PI INDICATOR 7.0 (GLOVE) ×1
GLOVE BIOGEL PI INDICATOR 7.5 (GLOVE) ×1
GLOVE SURG SS PI 7.5 STRL IVOR (GLOVE) ×1 IMPLANT
GOWN STRL REUS W/ TWL LRG LVL3 (GOWN DISPOSABLE) ×2 IMPLANT
GOWN STRL REUS W/TWL LRG LVL3 (GOWN DISPOSABLE) ×4
ILLUMINATOR WAVEGUIDE N/F (MISCELLANEOUS) IMPLANT
KIT MARKER MARGIN INK (KITS) ×2 IMPLANT
LIGHT WAVEGUIDE WIDE FLAT (MISCELLANEOUS) IMPLANT
NDL HYPO 25X1 1.5 SAFETY (NEEDLE) ×1 IMPLANT
NDL SAFETY ECLIPSE 18X1.5 (NEEDLE) IMPLANT
NEEDLE HYPO 18GX1.5 SHARP (NEEDLE)
NEEDLE HYPO 25X1 1.5 SAFETY (NEEDLE) ×2 IMPLANT
NS IRRIG 1000ML POUR BTL (IV SOLUTION) ×1 IMPLANT
PACK BASIN DAY SURGERY FS (CUSTOM PROCEDURE TRAY) ×2 IMPLANT
PENCIL SMOKE EVACUATOR (MISCELLANEOUS) ×2 IMPLANT
SLEEVE SCD COMPRESS KNEE MED (MISCELLANEOUS) ×2 IMPLANT
SPONGE LAP 18X18 RF (DISPOSABLE) ×2 IMPLANT
SUT MON AB 4-0 PC3 18 (SUTURE) ×4 IMPLANT
SUT SILK 2 0 SH (SUTURE) IMPLANT
SUT VIC AB 3-0 54X BRD REEL (SUTURE) IMPLANT
SUT VIC AB 3-0 BRD 54 (SUTURE) ×2
SUT VICRYL 3-0 CR8 SH (SUTURE) ×2 IMPLANT
SYR CONTROL 10ML LL (SYRINGE) ×2 IMPLANT
TOWEL GREEN STERILE FF (TOWEL DISPOSABLE) ×2 IMPLANT
TRAY FAXITRON CT DISP (TRAY / TRAY PROCEDURE) ×2 IMPLANT
TUBE CONNECTING 20X1/4 (TUBING) ×1 IMPLANT
YANKAUER SUCT BULB TIP NO VENT (SUCTIONS) ×1 IMPLANT

## 2019-07-02 NOTE — Anesthesia Procedure Notes (Signed)
Anesthesia Regional Block: Pectoralis block   Pre-Anesthetic Checklist: ,, timeout performed, Correct Patient, Correct Site, Correct Laterality, Correct Procedure, Correct Position, site marked, Risks and benefits discussed,  Surgical consent,  Pre-op evaluation,  At surgeon's request and post-op pain management  Laterality: Left  Prep: chloraprep       Needles:  Injection technique: Single-shot     Needle Length: 9cm  Needle Gauge: 21     Additional Needles:   Narrative:  Start time: 07/02/2019 11:30 AM End time: 07/02/2019 11:40 AM Injection made incrementally with aspirations every 5 mL.  Performed by: Personally  Anesthesiologist: Lillia Abed, MD  Additional Notes: Monitors applied. Patient sedated. Sterile prep and drape,hand hygiene and sterile gloves were used. Relevant anatomy identified.Needle position confirmed.Local anesthetic injected incrementally after negative aspiration. Local anesthetic spread visualized. Vascular puncture avoided. No complications. Image printed for medical record.The patient tolerated the procedure well.

## 2019-07-02 NOTE — Anesthesia Postprocedure Evaluation (Signed)
Anesthesia Post Note  Patient: Azayah Gens  Procedure(s) Performed: LEFT BREAST LUMPECTOMY WITH RADIOACTIVE SEED AND SENTINEL LYMPH NODE BIOPSY (Left Breast)     Patient location during evaluation: PACU Anesthesia Type: General Level of consciousness: awake and alert Pain management: pain level controlled Vital Signs Assessment: post-procedure vital signs reviewed and stable Respiratory status: spontaneous breathing, nonlabored ventilation, respiratory function stable and patient connected to nasal cannula oxygen Cardiovascular status: blood pressure returned to baseline and stable Postop Assessment: no apparent nausea or vomiting Anesthetic complications: no    Last Vitals:  Vitals:   07/02/19 1515 07/02/19 1530  BP: 120/75 121/71  Pulse: 80 79  Resp: (!) 9 10  Temp:    SpO2: 98% 97%    Last Pain:  Vitals:   07/02/19 1515  TempSrc:   PainSc: Asleep                 Sircharles Holzheimer DAVID

## 2019-07-02 NOTE — Anesthesia Preprocedure Evaluation (Signed)
Anesthesia Evaluation  Patient identified by MRN, date of birth, ID band Patient awake    Reviewed: Allergy & Precautions, NPO status , Patient's Chart, lab work & pertinent test results  History of Anesthesia Complications (+) PONV  Airway Mallampati: I  TM Distance: >3 FB Neck ROM: Full    Dental   Pulmonary    Pulmonary exam normal        Cardiovascular Normal cardiovascular exam     Neuro/Psych    GI/Hepatic GERD  Medicated and Controlled,  Endo/Other    Renal/GU      Musculoskeletal   Abdominal   Peds  Hematology   Anesthesia Other Findings   Reproductive/Obstetrics                             Anesthesia Physical Anesthesia Plan  ASA: II  Anesthesia Plan: General   Post-op Pain Management:  Regional for Post-op pain   Induction:   PONV Risk Score and Plan: 4 or greater and Ondansetron, Dexamethasone, Midazolam and Treatment may vary due to age or medical condition  Airway Management Planned: LMA  Additional Equipment:   Intra-op Plan:   Post-operative Plan: Extubation in OR  Informed Consent: I have reviewed the patients History and Physical, chart, labs and discussed the procedure including the risks, benefits and alternatives for the proposed anesthesia with the patient or authorized representative who has indicated his/her understanding and acceptance.       Plan Discussed with: CRNA and Surgeon  Anesthesia Plan Comments:         Anesthesia Quick Evaluation

## 2019-07-02 NOTE — H&P (Signed)
Lauren Osborne  Location: [facility] Patient #: [id number] DOB: 07/27/1955 Undefined / Language: English / Race: White Female   History of Present Illness  The patient is a 63 year old female who presents with breast cancer. We are asked to see the patient in consultation by Dr. Magrinat to evaluate her for a new left breast cancer. the patient is a 63 year old white female who presents with a 5mm mass in the upper inner left breast on screen. The axilla looked neg. 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 She does not smoke. she does not know her family history   Past Surgical History  Hip Surgery  Right. Hysterectomy (not due to cancer) - Complete   Diagnostic Studies History  Colonoscopy  1-5 years ago Mammogram  within last year Pap Smear  1-5 years ago  Medication History  Medications Reconciled  Social History  Alcohol use  Occasional alcohol use. Caffeine use  Carbonated beverages, Coffee, Tea. No drug use  Tobacco use  Never smoker.  Family History  Arthritis  Mother. Cancer  Father.  Pregnancy / Birth History  Age at menarche  14 years. Age of menopause  46-50 Contraceptive History  Oral contraceptives. Gravida  2 Maternal age  36-40 Para  2 Regular periods   Other Problems  Arthritis  Bladder Problems  Gastroesophageal Reflux Disease  Lump In Breast     Review of Systems  General Not Present- Appetite Loss, Chills, Fatigue, Fever, Night Sweats, Weight Gain and Weight Loss. Skin Not Present- Change in Wart/Mole, Dryness, Hives, Jaundice, New Lesions, Non-Healing Wounds, Rash and Ulcer. HEENT Present- Wears glasses/contact lenses. Not Present- Earache, Hearing Loss, Hoarseness, Nose Bleed, Oral Ulcers, Ringing in the Ears, Seasonal Allergies, Sinus Pain, Sore Throat, Visual Disturbances and Yellow Eyes. Respiratory Not Present- Bloody sputum, Chronic Cough, Difficulty Breathing,  Snoring and Wheezing. Breast Present- Breast Mass and Breast Pain. Not Present- Nipple Discharge and Skin Changes. Cardiovascular Not Present- Chest Pain, Difficulty Breathing Lying Down, Leg Cramps, Palpitations, Rapid Heart Rate, Shortness of Breath and Swelling of Extremities. Gastrointestinal Not Present- Abdominal Pain, Bloating, Bloody Stool, Change in Bowel Habits, Chronic diarrhea, Constipation, Difficulty Swallowing, Excessive gas, Gets full quickly at meals, Hemorrhoids, Indigestion, Nausea, Rectal Pain and Vomiting. Female Genitourinary Present- Frequency and Urgency. Not Present- Nocturia, Painful Urination and Pelvic Pain. Musculoskeletal Not Present- Back Pain, Joint Pain, Joint Stiffness, Muscle Pain, Muscle Weakness and Swelling of Extremities. Neurological Not Present- Decreased Memory, Fainting, Headaches, Numbness, Seizures, Tingling, Tremor, Trouble walking and Weakness. Psychiatric Not Present- Anxiety, Bipolar, Change in Sleep Pattern, Depression, Fearful and Frequent crying. Endocrine Not Present- Cold Intolerance, Excessive Hunger, Hair Changes, Heat Intolerance, Hot flashes and New Diabetes. Hematology Not Present- Blood Thinners, Easy Bruising, Excessive bleeding, Gland problems, HIV and Persistent Infections.   Physical Exam  General Mental Status-Alert. General Appearance-Consistent with stated age. Hydration-Well hydrated. Voice-Normal.  Head and Neck Head-normocephalic, atraumatic with no lesions or palpable masses. Trachea-midline. Thyroid Gland Characteristics - normal size and consistency.  Eye Eyeball - Bilateral-Extraocular movements intact. Sclera/Conjunctiva - Bilateral-No scleral icterus.  Chest and Lung Exam Chest and lung exam reveals -quiet, even and easy respiratory effort with no use of accessory muscles and on auscultation, normal breath sounds, no adventitious sounds and normal vocal resonance. Inspection Chest Wall -  Normal. Back - normal.  Breast Note: There is no palpable mass in either breast. There is no palpable   axillary, supraclavicular, or cervical lymphadenopathy   Cardiovascular Cardiovascular examination reveals -normal heart sounds, regular rate and rhythm with no murmurs and normal pedal pulses bilaterally.  Abdomen Inspection Inspection of the abdomen reveals - No Hernias. Skin - Scar - no surgical scars. Palpation/Percussion Palpation and Percussion of the abdomen reveal - Soft, Non Tender, No Rebound tenderness, No Rigidity (guarding) and No hepatosplenomegaly. Auscultation Auscultation of the abdomen reveals - Bowel sounds normal.  Neurologic Neurologic evaluation reveals -alert and oriented x 3 with no impairment of recent or remote memory. Mental Status-Normal.  Musculoskeletal Normal Exam - Left-Upper Extremity Strength Normal and Lower Extremity Strength Normal. Normal Exam - Right-Upper Extremity Strength Normal and Lower Extremity Strength Normal.  Lymphatic Head & Neck  General Head & Neck Lymphatics: Bilateral - Description - Normal. Axillary  General Axillary Region: Bilateral - Description - Normal. Tenderness - Non Tender. Femoral & Inguinal  Generalized Femoral & Inguinal Lymphatics: Bilateral - Description - Normal. Tenderness - Non Tender.    Assessment & Plan  MALIGNANT NEOPLASM OF UPPER-INNER QUADRANT OF LEFT BREAST IN FEMALE, ESTROGEN RECEPTOR POSITIVE (C50.212) Impression: The patient appears to have a small stage 1 cancer in the UIQ of the left breast. I have talked to her in detail about the different options for treatment and at this point she favors breast conservation and sentinel node biopsy which I feel is a very reasonable way of treating her cancer. I have discussed with her the risks and benefits of the surgery as well as some of the technical aspects including the use of radioactive seed for localization and she understands and wishes  to proceed. she will also follow up with med and rad onc for adjuvant therapy Current Plans Referred to Oncology, for evaluation and follow up (Oncology). Routine. Addendum Note(Paul S. Toth MD; 04/28/2019 12:05 PM) She does not want clips left in the breast after surgery     

## 2019-07-02 NOTE — Discharge Instructions (Signed)
No Tylenol until 5:30pm    Post Anesthesia Home Care Instructions  Activity: Get plenty of rest for the remainder of the day. A responsible individual must stay with you for 24 hours following the procedure.  For the next 24 hours, DO NOT: -Drive a car -Paediatric nurse -Drink alcoholic beverages -Take any medication unless instructed by your physician -Make any legal decisions or sign important papers.  Meals: Start with liquid foods such as gelatin or soup. Progress to regular foods as tolerated. Avoid greasy, spicy, heavy foods. If nausea and/or vomiting occur, drink only clear liquids until the nausea and/or vomiting subsides. Call your physician if vomiting continues.  Special Instructions/Symptoms: Your throat may feel dry or sore from the anesthesia or the breathing tube placed in your throat during surgery. If this causes discomfort, gargle with warm salt water. The discomfort should disappear within 24 hours.  If you had a scopolamine patch placed behind your ear for the management of post- operative nausea and/or vomiting:  1. The medication in the patch is effective for 72 hours, after which it should be removed.  Wrap patch in a tissue and discard in the trash. Wash hands thoroughly with soap and water. 2. You may remove the patch earlier than 72 hours if you experience unpleasant side effects which may include dry mouth, dizziness or visual disturbances. 3. Avoid touching the patch. Wash your hands with soap and water after contact with the patch.

## 2019-07-02 NOTE — Progress Notes (Signed)
Assisted Dr. Ossey with left, ultrasound guided, pectoralis block. Side rails up, monitors on throughout procedure. See vital signs in flow sheet. Tolerated Procedure well. 

## 2019-07-02 NOTE — Transfer of Care (Signed)
Immediate Anesthesia Transfer of Care Note  Patient: Lauren Osborne  Procedure(s) Performed: LEFT BREAST LUMPECTOMY WITH RADIOACTIVE SEED AND SENTINEL LYMPH NODE BIOPSY (Left Breast)  Patient Location: PACU  Anesthesia Type:General  Level of Consciousness: awake and patient cooperative  Airway & Oxygen Therapy: Patient Spontanous Breathing and Patient connected to face mask oxygen  Post-op Assessment: Report given to RN and Post -op Vital signs reviewed and stable  Post vital signs: Reviewed and stable  Last Vitals:  Vitals Value Taken Time  BP    Temp    Pulse 91 07/02/19 1423  Resp    SpO2 100 % 07/02/19 1423  Vitals shown include unvalidated device data.  Last Pain:  Vitals:   07/02/19 1054  TempSrc: Tympanic  PainSc: 0-No pain      Patients Stated Pain Goal: 7 ([id number])  Complications: No apparent anesthesia complications

## 2019-07-02 NOTE — Interval H&P Note (Signed)
History and Physical Interval Note:  07/02/2019 12:38 PM  Lauren Osborne  has presented today for surgery, with the diagnosis of Left breast cancer.  The various methods of treatment have been discussed with the patient and family. After consideration of risks, benefits and other options for treatment, the patient has consented to  Procedure(s): LEFT BREAST LUMPECTOMY WITH RADIOACTIVE SEED AND SENTINEL LYMPH NODE BIOPSY (Left) as a surgical intervention.  The patient's history has been reviewed, patient examined, no change in status, stable for surgery.  I have reviewed the patient's chart and labs.  Questions were answered to the patient's satisfaction.     Autumn Messing III

## 2019-07-02 NOTE — Anesthesia Procedure Notes (Signed)
Procedure Name: LMA Insertion Date/Time: 07/02/2019 12:53 PM Performed by: Marrianne Mood, CRNA Pre-anesthesia Checklist: Patient identified, Emergency Drugs available, Suction available, Patient being monitored and Timeout performed Patient Re-evaluated:Patient Re-evaluated prior to induction Oxygen Delivery Method: Circle system utilized Preoxygenation: Pre-oxygenation with 100% oxygen Induction Type: IV induction Ventilation: Mask ventilation without difficulty LMA: LMA inserted LMA Size: 4.0 Number of attempts: 1 Airway Equipment and Method: Bite block Placement Confirmation: positive ETCO2 Tube secured with: Tape Dental Injury: Teeth and Oropharynx as per pre-operative assessment

## 2019-07-02 NOTE — Op Note (Signed)
07/02/2019  2:14 PM  PATIENT:  Lauren Osborne  64 y.o. female  PRE-OPERATIVE DIAGNOSIS:  Left breast cancer  POST-OPERATIVE DIAGNOSIS:  Left breast cancer  PROCEDURE:  Procedure(s): LEFT BREAST LUMPECTOMY WITH RADIOACTIVE SEED LOCALIZATION  AND DEEP LEFT SENTINEL LYMPH NODE BIOPSY (Left)  SURGEON:  Surgeon(s) and Role:    Jovita Kussmaul, MD - Primary  PHYSICIAN ASSISTANT:   ASSISTANTS: none   ANESTHESIA:   local and general  EBL:  25 mL   BLOOD ADMINISTERED:none  DRAINS: none   LOCAL MEDICATIONS USED:  MARCAINE     SPECIMEN:  Source of Specimen:  left breast tissue with additional medial margin and sentinel node  DISPOSITION OF SPECIMEN:  PATHOLOGY  COUNTS:  YES  TOURNIQUET:  * No tourniquets in log *  DICTATION: .Dragon Dictation   After informed consent was obtained the patient was brought to the operating room and placed in the supine position on the operating table.  After adequate induction of general anesthesia the patient's left chest, breast, and axillary area were prepped with ChloraPrep, allowed to dry, and draped in usual sterile manner.  An appropriate timeout was performed.  Earlier in the day the patient underwent injection of 1 mCi of technetium sulfur colloid in the subareolar position on the left.  Previously an I-125 seed was also placed in the upper central left breast to mark an area of invasive breast cancer.  Attention was first turned to the left axilla.  The neoprobe was set to technetium and an area of radioactivity was readily identified.  The area was then infiltrated with quarter percent Marcaine.  A small transversely oriented incision was made with a 15 blade knife overlying the area of radioactivity.  The incision was carried through the skin and subcutaneous tissue sharply with the electrocautery.  Dissection was then carried into the deep left axillary space under the direction of the neoprobe.  With blunt hemostat dissection I was able  to identify a hot lymph node.  This lymph node was excised sharply with the electrocautery and the surrounding lymphatics were clamped with hemostats, divided, and ligated with 3-0 Vicryl ties.  Ex vivo counts on this node was approximately 1600.  I suspect there were 2 hot lymph nodes in the specimen that was removed but this was sent as sentinel node #1 to pathology for further evaluation.  No other hot or palpable lymph nodes were identified in the left axilla.  Hemostasis was achieved using the Bovie electrocautery.  The deep layer of the wound was then closed with interrupted 3-0 Vicryl stitches.  The skin was closed with a running 4-0 Monocryl subcuticular stitch.  Attention was then turned to the left breast.  The neoprobe was set to I-125 in the area of radioactivity was readily identified in the upper central left breast.  The area around this was infiltrated with quarter percent Marcaine.  A curvilinear incision was made along the upper edge of the areola of the left breast with a 15 blade knife.  The incision was carried through the skin and subcutaneous tissue sharply with the electrocautery.  Dissection was then carried out into the breast tissue towards the radioactive seed under the direction of the neoprobe.  Once I more closely approached the radioactive seed I then removed a circular portion of breast tissue sharply with the electrocautery around the radioactive seed while checking the area of radioactivity frequently.  Once the specimen was removed it was oriented with the appropriate paint  colors.  A specimen radiograph was obtained that showed the clip and seed to be near the center of the specimen.  I did feel I might be a little bit close on the medial edge so an additional medial margin was taken sharply and marked appropriately.  The specimens were then sent to pathology for further evaluation.  Hemostasis was achieved using the Bovie electrocautery.  The wound was irrigated with saline and  infiltrated with more quarter percent Marcaine.  The deep layer of the wound was then closed with layers of interrupted 3-0 Vicryl stitches.  The skin was closed with interrupted 4-0 Monocryl subcuticular stitches.  Dermabond dressings were applied.  The patient tolerated the procedure well.  At the end of the case all needle sponge and instrument counts were correct.  The patient was then awakened and taken to recovery in stable condition.  PLAN OF CARE: Discharge to home after PACU  PATIENT DISPOSITION:  PACU - hemodynamically stable.   Delay start of Pharmacological VTE agent (>24hrs) due to surgical blood loss or risk of bleeding: not applicable

## 2019-07-05 ENCOUNTER — Encounter: Payer: Self-pay | Admitting: *Deleted

## 2019-07-05 LAB — SURGICAL PATHOLOGY

## 2019-07-08 ENCOUNTER — Other Ambulatory Visit: Payer: Self-pay | Admitting: Oncology

## 2019-07-08 ENCOUNTER — Telehealth: Payer: Self-pay | Admitting: *Deleted

## 2019-07-08 NOTE — Telephone Encounter (Signed)
Received order per Dr. Jana Hakim for Oncotype Testing. Requisition faxed to pathology.

## 2019-07-13 ENCOUNTER — Other Ambulatory Visit: Payer: Self-pay

## 2019-07-13 ENCOUNTER — Other Ambulatory Visit: Payer: Self-pay | Admitting: Allergy and Immunology

## 2019-07-13 NOTE — Telephone Encounter (Signed)
Refill for azelastine and Albuterol denied. Patient needs an OV

## 2019-07-15 ENCOUNTER — Encounter: Payer: Self-pay | Admitting: *Deleted

## 2019-07-15 ENCOUNTER — Other Ambulatory Visit: Payer: Self-pay

## 2019-07-15 ENCOUNTER — Telehealth: Payer: Self-pay | Admitting: *Deleted

## 2019-07-15 NOTE — Telephone Encounter (Signed)
Spoke with patient to inform her that we would cancel her appointment with Dr. Sondra Come until we get the results of the onocotype.  Patient verbalized understanding. Msg sent to cancel appt.

## 2019-07-19 ENCOUNTER — Ambulatory Visit: Payer: BC Managed Care – PPO | Admitting: Radiation Oncology

## 2019-07-21 ENCOUNTER — Other Ambulatory Visit: Payer: Self-pay | Admitting: Allergy and Immunology

## 2019-07-26 ENCOUNTER — Ambulatory Visit: Payer: BC Managed Care – PPO | Attending: General Surgery | Admitting: Physical Therapy

## 2019-07-26 ENCOUNTER — Other Ambulatory Visit: Payer: Self-pay

## 2019-07-26 ENCOUNTER — Encounter: Payer: Self-pay | Admitting: Physical Therapy

## 2019-07-26 DIAGNOSIS — R293 Abnormal posture: Secondary | ICD-10-CM | POA: Insufficient documentation

## 2019-07-26 DIAGNOSIS — Z483 Aftercare following surgery for neoplasm: Secondary | ICD-10-CM | POA: Insufficient documentation

## 2019-07-26 DIAGNOSIS — Z170 Estrogen receptor positive status [ER+]: Secondary | ICD-10-CM | POA: Diagnosis present

## 2019-07-26 DIAGNOSIS — C50212 Malignant neoplasm of upper-inner quadrant of left female breast: Secondary | ICD-10-CM | POA: Diagnosis present

## 2019-07-26 DIAGNOSIS — M25612 Stiffness of left shoulder, not elsewhere classified: Secondary | ICD-10-CM | POA: Insufficient documentation

## 2019-07-26 NOTE — Therapy (Signed)
[place], [name], [address] Phone: [phone]   Fax:  [phone]  Physical Therapy Treatment  Patient Details  Name: Lauren Osborne MRN: [id number] Date of Birth: 1955-09-10 Referring Provider (PT): Dr. Autumn Messing   Encounter Date: 07/26/2019  PT End of Session - 07/26/19 1149    Visit Number  2    Number of Visits  2    PT Start Time  1103    PT Stop Time  1151    PT Time Calculation (min)  48 min    Activity Tolerance  Patient tolerated treatment well    Behavior During Therapy  [facility] for tasks assessed/performed       Past Medical History:  Diagnosis Date  . Abnormal Pap smear of cervix 12/2009   ASCUS cannot exclude HGSIL.  [name] Arthralgia of right hip 2015   Hip replacement   . Cancer ([place]) 04/2019   left breast cancer  . Complication of anesthesia   . Cystocele or rectocele with uterine prolapse   . Environmental and seasonal allergies   . Headache   . IBS (irritable bowel syndrome)   . Osteoarthritis   . PONV (postoperative nausea and vomiting)   . Rectocele   . 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 From [facility]     Past Surgical History:  Procedure Laterality Date  . BREAST LUMPECTOMY WITH RADIOACTIVE SEED AND SENTINEL LYMPH NODE BIOPSY Left 07/02/2019   Procedure: LEFT BREAST LUMPECTOMY WITH RADIOACTIVE SEED AND SENTINEL LYMPH NODE BIOPSY;  Surgeon: Jovita Kussmaul, MD;  Location: [facility];  Service: General;  Laterality: Left;  . BUNIONECTOMY Right   . COLPOSCOPY  12/2009   negative  . EYE SURGERY     cataracts  . FOOT SURGERY Right    bone removed   . hardward removal from right foot  12/15   Dr. Wardell Honour  . JOINT REPLACEMENT Right 2001   Hip replacement   . Posterior vaginal repair  10/21/2012   Procedure: VAGINAL REPAIR POSTERIOR; Surgeon: Daneil Dolin, MD; Location: [place]; Service:  Gynecology;;  . SINOSCOPY    . TONSILLECTOMY    . UTERINE SUSPENSION  10/21/12   Procedure: UTEROSACRAL SUSPENSION; Surgeon: Daneil Dolin, MD; Location: [facility] MAIN OR; Service: Gynecology;;  . VAGINAL HYSTERECTOMY  10/21/12   Procedure: HYSTERECTOMY VAGINAL; Surgeon: Daneil Dolin, MD; Location: [facility]; Service: Gynecology; Laterality: N/A; bilateral salpingectomy    There were no vitals filed for this visit.  Subjective Assessment - 07/26/19 1100    Subjective  Patient reports she underwent a left lumpectomy and had 3 negative nodes removed on 07/02/2019. She is awaiting Oncotype results to determine next steps.    Pertinent History  Patient was diagnosed on 06/14/2018 with left grade II invasive ductal carcinoma breast cancer. She underwent a left lumpectomy and had 3 negative nodes removed on 07/02/2019. It is ER/PR positive, HER2 negative with a Ki67 of 5%. She also reports a right total hip replacement in 2001 and hx of fibromyalgia and osteoarthritis.    Patient Stated Goals  See if my arm is ok    Currently in Pain?  Yes    Pain Score  2     Pain Location  Arm    Pain Orientation  Left    Pain Descriptors / Indicators  Aching    Pain Type  Surgical pain  Pain Onset  1 to 4 weeks ago    Pain Frequency  Intermittent    Aggravating Factors   Reaching down    Pain Relieving Factors  Reaching up    Multiple Pain Sites  No         [facility] PT Assessment - 07/26/19 0001      Assessment   Medical Diagnosis  s/p left lumpectomy and SLNB    Referring Provider (PT)  Dr. Autumn Messing    Onset Date/Surgical Date  07/02/19    Hand Dominance  Right    Prior Therapy  Baselines      Precautions   Precautions  Other (comment)    Precaution Comments  recent surgery; left arm lymphedema      Restrictions   Weight Bearing Restrictions  No      Balance Screen   Has the patient fallen in the past 6 months  No    Has the patient had a decrease in activity level  because of a fear of falling?   No    Is the patient reluctant to leave their home because of a fear of falling?   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       Posture/Postural Control   Posture/Postural Control  Postural limitations    Postural Limitations  Rounded Shoulders;Forward head      ROM / Strength   AROM / PROM / Strength  AROM      AROM   AROM Assessment Site  Shoulder    Right/Left Shoulder  Left    Left Shoulder Extension  60 Degrees    Left Shoulder Flexion  140 Degrees    Left Shoulder ABduction  156 Degrees    Left Shoulder Internal Rotation  77 Degrees    Left Shoulder External Rotation  80 Degrees        LYMPHEDEMA/ONCOLOGY QUESTIONNAIRE - 07/26/19 1122      Type   Cancer Type  Left breast cancer      Surgeries   Lumpectomy Date  07/02/19    Sentinel Lymph Node Biopsy Date  07/02/19    Number Lymph Nodes Removed  3      Treatment   Active Chemotherapy Treatment  No    Past Chemotherapy Treatment  No    Active Radiation Treatment  No    Past Radiation Treatment  No    Current Hormone Treatment  No    Past Hormone Therapy  No      What other symptoms do you have   Are you Having Heaviness or Tightness  No    Are you having Pain  Yes    Are you having pitting edema  No    Is it Hard or Difficult finding clothes that fit  No    Do you have infections  No    Is there Decreased scar mobility  Yes    Stemmer Sign  No      Lymphedema Assessments   Lymphedema  Assessments  Upper extremities      Right Upper Extremity Lymphedema   10 cm Proximal to Olecranon Process  30.9 cm    Olecranon Process  26.6 cm    10 cm Proximal to Ulnar Styloid Process  21.7 cm    Just Proximal to Ulnar Styloid Process  15.8 cm    Across Hand at [employer]  19.8 cm    At [place] of 2nd Digit  5.8 cm      Left Upper Extremity Lymphedema   10 cm Proximal to Olecranon Process  31.7 cm    Olecranon Process  26.6 cm    10 cm Proximal to Ulnar Styloid Process  21.2 cm    Just Proximal to Ulnar Styloid Process  16 cm    Across Hand at [employer]  18.2 cm    At [place] of 2nd Digit  5.6 cm        Quick Dash - 07/26/19 0001    Open a tight or new jar  Unable    Do heavy household chores (wash walls, wash floors)  Moderate difficulty    Carry a shopping bag or briefcase  Mild difficulty    Wash your back  Mild difficulty    Use a knife to cut food  Mild difficulty    Recreational activities in which you take some force or impact through your arm, shoulder, or hand (golf, hammering, tennis)  Moderate difficulty    During the past week, to what extent has your arm, shoulder or hand problem interfered with your normal social activities with family, friends, neighbors, or groups?  Slightly    During the past week, to what extent has your arm, shoulder or hand problem limited your work or other regular daily activities  Slightly    Arm, shoulder, or hand pain.  Mild    Tingling (pins and needles) in your arm, shoulder, or hand  Moderate    Difficulty Sleeping  No difficulty    DASH Score  36.36 %       Patient was instructed today in a home exercise program today for post op shoulder range of motion. These included active assist shoulder flexion in sitting, scapular retraction, wall walking with shoulder abduction, and hands behind head external rotation.  She was encouraged to do these twice a day, holding 3 seconds and repeating 5 times when permitted by her  physician.     PT Education - 07/26/19 1143    Education Details  Shoulder abduction exercises; lymphedema reduction practices; scar massage using coconut oil until radiation begins.    Person(s) Educated  Patient    Methods  Explanation;Demonstration;Handout;Tactile cues    Comprehension  Verbalized understanding;Returned demonstration;Tactile cues required;Verbal cues required          PT Long Term Goals - 07/26/19 1208      PT LONG TERM GOAL #1   Title  Patient will demonstrate she has regained full shoulder ROM and function post operatively compared to baseline assessments.    Time  8    Period  Weeks    Status  Partially Met            Plan - 07/26/19 1204    Clinical Impression Statement  Patient is doing very well s/p left lumpectomy and sentinel node biopsy on 07/02/2019. Her incisions have healed well, she has  regained nearly full shoulder ROM and there is no sign of lymphedema. She is very anxious about awaiting Oncotype results and had many questions about that and about radiation. PT explained the typical timing of the next steps and explained the delay in Oncotype results as it is likely related to insurance authorization. Reached out to breast nurse navigator via in-basket messaging to see if she knows when the pt might receive results but am awaiting her response. The pt is signed up for the After Breast Cancer class on 08/16/2019 to be educated on lymphedema risk reduction practices. Otherwise, she has no further PT needs at this time.    PT Treatment/Interventions  ADLs/Self Care Home Management;Therapeutic exercise;Patient/family education    PT Next Visit Plan  D/C    PT Home Exercise Plan  Post op shoulder ROM HEP    Consulted and Agree with Plan of Care  Patient       Patient will benefit from skilled therapeutic intervention in order to improve the following deficits and impairments:  Postural dysfunction, Decreased range of motion, Decreased knowledge of  precautions, Impaired UE functional use, Pain, Decreased scar mobility  Visit Diagnosis: Malignant neoplasm of upper-inner quadrant of left breast in female, estrogen receptor positive (HCC)  Abnormal posture  Stiffness of left shoulder, not elsewhere classified  Aftercare following surgery for neoplasm  Patient will follow up at outpatient cancer rehab 3-4 weeks following surgery.  If the patient requires physical therapy at that time, a specific plan will be dictated and sent to the referring physician for approval. The patient was educated today on appropriate basic range of motion exercises to begin post operatively and the importance of attending the After Breast Cancer class following surgery.  Patient was educated today on lymphedema risk reduction practices as it pertains to recommendations that will benefit the patient immediately following surgery.  She verbalized good understanding.      Problem List Patient Active Problem List   Diagnosis Date Noted  . Malignant neoplasm of upper-inner quadrant of left breast, estrogen receptor positive ([place]) 04/27/2019  . Perennial allergic rhinitis with a predominantly nonallergic component 04/13/2018  . Dyspnea 04/13/2018  . Allergic conjunctivitis 04/13/2018  . GERD (gastroesophageal reflux disease) 11/28/2017  . 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 Urinary frequency 07/07/2015  . Rectal pressure 07/07/2015  . Arthralgia of hip 03/22/2014  . Arthralgia, sacroiliac 03/22/2014  . ANA positive 03/10/2014  . 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 Uterovaginal prolapse 09/17/2012   Annia Friendly, PT 07/26/19 12:09 PM  [place], [name], [address] Phone: [phone]   Fax:  [phone]  Name: Lauren Osborne MRN: [id number] Date of Birth: 07-26-1955  PHYSICAL THERAPY DISCHARGE SUMMARY  Visits from Start of Care: 2  Current functional  level related to goals / functional outcomes:[name] Goals nearly met. See above for objective findings.   Remaining deficits: She has a very mild left shoulder abduction ROM deficit.   Education / Equipment: HEP and lymphedema risk reduction practices. And scar masage Plan: Patient agrees to discharge.  Patient goals were partially met. Patient is being discharged due to being pleased with the current functional level.  ?????         Annia Friendly, [place] 07/26/19 12:10 PM

## 2019-07-26 NOTE — Patient Instructions (Addendum)
External Rotation (Isometric)    Place back of left fist against door frame, with elbow bent. Press fist against door frame. Hold __5__ seconds. Repeat __5__ times. Do __2__ sessions per day. Do this on both arms - one at a time.  http://gt2.exer.us/110   Copyright  VHI. All rights reserved.  Closed Chain: Shoulder Abduction / Adduction - on Wall    One hand on wall, step to side and return. Stepping causes shoulder to abduct and adduct. Step _5__ times, hold 5 seconds, _2__ times per day.  http://ss.exer.us/267   Copyright  VHI. All rights reserved.

## 2019-07-27 ENCOUNTER — Telehealth: Payer: Self-pay | Admitting: *Deleted

## 2019-07-27 NOTE — Telephone Encounter (Signed)
Pt called regarding appt with Dr. Jana Hakim on 3/17 to discuss oncotype results. Informed pt that oncotype is to be released on 3/17 but will make an appt on 3/22 just in case report is not released. Informed pt that I will call this afternoon to inform if pt should keep appt on 3/17 or if we will cancel it. Received verbal understanding.

## 2019-07-27 NOTE — Telephone Encounter (Signed)
Called pt and informed oncotype report has not resulted and that we will cancel her appt with Dr. Jana Hakim on 3/17. Received verbal understanding.

## 2019-07-28 ENCOUNTER — Inpatient Hospital Stay: Payer: BC Managed Care – PPO | Admitting: Oncology

## 2019-07-29 ENCOUNTER — Telehealth: Payer: Self-pay | Admitting: *Deleted

## 2019-07-29 DIAGNOSIS — Z170 Estrogen receptor positive status [ER+]: Secondary | ICD-10-CM

## 2019-07-29 DIAGNOSIS — C50212 Malignant neoplasm of upper-inner quadrant of left female breast: Secondary | ICD-10-CM

## 2019-07-29 NOTE — Telephone Encounter (Signed)
Received onoctype results of 10/3%.  Patient is aware. Referral has been placed for Dr. Sondra Come appt.  Patient will see Dr. Jana Hakim after xrt complete.  appt for 3/22 has been cancelled per patient request.

## 2019-08-02 ENCOUNTER — Ambulatory Visit: Payer: BC Managed Care – PPO | Admitting: Oncology

## 2019-08-02 ENCOUNTER — Encounter: Payer: Self-pay | Admitting: *Deleted

## 2019-08-02 ENCOUNTER — Encounter: Payer: Self-pay | Admitting: Oncology

## 2019-08-02 ENCOUNTER — Ambulatory Visit: Payer: BC Managed Care – PPO | Admitting: Rehabilitation

## 2019-08-03 ENCOUNTER — Encounter: Payer: Self-pay | Admitting: *Deleted

## 2019-08-12 ENCOUNTER — Encounter (HOSPITAL_COMMUNITY): Payer: Self-pay | Admitting: Oncology

## 2019-08-13 NOTE — Progress Notes (Signed)
Location of Breast Cancer:   Malignant neoplasm of upper-inner quadrant of left breast in female, estrogen receptor positive      Histology per Pathology Report: 04/22/19: Diagnosis Breast, left, needle core biopsy, 11 o'clock - INVASIVE DUCTAL CARCINOMA - DUCTAL CARCINOMA IN SITU   Receptor Status: ER(95%), PR (50%), Her2-neu (negative), Ki-(5%)  Did patient present with symptoms (if so, please note symptoms) or was this found on screening mammography?: She had routine screening mammography on 04/14/2019 showing possible asymmetry in the right breast and possible distortion and asymmetry in the left breast. She underwent bilateral diagnostic mammography with tomography and left breast ultrasonography at [facility] on 04/20/2019 showing 0.5cm suspicious mass in left breast at 11 o'clock. Targeted ultrasound of the left axilla did not demonstrate any abnormal appearing lymph nodes. Fibroglandular tissue was seen in the right breast without persistent mass or distortion  Past/Anticipated interventions by surgeon, if any: 07/02/19: PROCEDURE:  Procedure(s): LEFT BREAST LUMPECTOMY WITH RADIOACTIVE SEED LOCALIZATION  AND DEEP LEFT SENTINEL LYMPH NODE BIOPSY (Left)  SURGEON:  Surgeon(s) and Role:    Jovita Kussmaul, MD - Primary  Past/Anticipated interventions by medical oncology, if any: Chemotherapy Per Bradly Chris 07/29/19: Received onoctype results of 10/3%.  Patient is aware. Referral has been placed for Dr. Sondra Come appt.  Patient will see Dr. Jana Hakim after xrt complete.  appt for 3/22 has been cancelled per patient request.  Lymphedema issues, if any:  Pt with good ROM. Pt is doing at-home exercises but does not appreciate any s/s lymphedema.   Pain issues, if any:  Pt reports generalized "aches and pains" but nothing in breast.   SAFETY ISSUES:  Prior radiation? no  Pacemaker/ICD? no  Possible current pregnancy?no  Is the patient on methotrexate? no  Current Complaints /  other details:  Pt presents today for f/u new with Dr. Sondra Come for Radiation Oncology. Pt is unaccompanied.  BP 133/70 (BP Location: Right Arm, Patient Position: Sitting)   Pulse 76   Temp 98.3 F (36.8 C) (Temporal)   Resp 18   Ht 5' 10"  (1.778 m)   Wt 199 lb 8 oz (90.5 kg)   LMP 05/13/2005   SpO2 98%   BMI 28.63 kg/m   Wt Readings from Last 3 Encounters:  08/16/19 199 lb 8 oz (90.5 kg)  07/02/19 200 lb 9.9 oz (91 kg)  04/28/19 200 lb 6.4 oz (90.9 kg)       Loma Sousa, RN 08/16/2019,2:34 PM

## 2019-08-16 ENCOUNTER — Other Ambulatory Visit: Payer: Self-pay

## 2019-08-16 ENCOUNTER — Encounter: Payer: Self-pay | Admitting: Radiation Oncology

## 2019-08-16 ENCOUNTER — Ambulatory Visit
Admission: RE | Admit: 2019-08-16 | Discharge: 2019-08-16 | Disposition: A | Discharge status: Home / Self Care | Payer: BC Managed Care – PPO | Source: Ambulatory Visit | Attending: Radiation Oncology | Admitting: Radiation Oncology

## 2019-08-16 VITALS — BP 133/70 | HR 76 | Temp 98.3°F | Resp 18 | Ht 70.0 in | Wt 199.5 lb

## 2019-08-16 DIAGNOSIS — Z170 Estrogen receptor positive status [ER+]: Secondary | ICD-10-CM | POA: Insufficient documentation

## 2019-08-16 DIAGNOSIS — J3089 Other allergic rhinitis: Secondary | ICD-10-CM

## 2019-08-16 DIAGNOSIS — Z79899 Other long term (current) drug therapy: Secondary | ICD-10-CM | POA: Diagnosis not present

## 2019-08-16 DIAGNOSIS — C50212 Malignant neoplasm of upper-inner quadrant of left female breast: Secondary | ICD-10-CM

## 2019-08-16 NOTE — Progress Notes (Signed)
Radiation Oncology         [phone]) [phone] ________________________________  Name: Lauren Osborne MRN: [id number]  Date: 08/17/2019  DOB: 08-Jun-1955  SIMULATION AND TREATMENT PLANNING NOTE    ICD-10-CM   1. Malignant neoplasm of upper-inner quadrant of left breast in female, estrogen receptor positive ([place])  C50.212    Z17.0     DIAGNOSIS: Stage IA Left Breast UOQ, Invasive Ductal Carcinoma with DCIS, ER+ / PR+ / Her2 -, Grade 2  NARRATIVE:  The patient was brought to the [facility].  Identity was confirmed.  All relevant records and images related to the planned course of therapy were reviewed.  The patient freely provided informed written consent to proceed with treatment after reviewing the details related to the planned course of therapy. The consent form was witnessed and verified by the simulation staff.  Then, the patient was set-up in a stable reproducible supine position for radiation therapy.  CT images were obtained.  Surface markings were placed.  The CT images were loaded into the planning software.  Then the target and avoidance structures were contoured.  Treatment planning then occurred.  The radiation prescription was entered and confirmed.  Then, I designed and supervised the construction of a total of 5 medically necessary complex treatment devices.  I have requested : 3D Simulation  I have requested a DVH of the following structures:,heart, Lungs, lumpectomy cavity.  I have ordered:CBC  PLAN:  The patient will receive 40.05 Gray Gy in 15 fractions directed at the left breast.  The patient will then receive a boost to the lumpectomy cavity of 10 Gray in 5 fractions.   Optical Surface Tracking Plan:  Since intensity modulated radiotherapy (IMRT) and 3D conformal radiation treatment methods are predicated on accurate and precise positioning for treatment, intrafraction motion monitoring is medically necessary to ensure accurate and safe treatment delivery.   The ability to quantify intrafraction motion without excessive ionizing radiation dose can only be performed with optical surface tracking. Accordingly, surface imaging offers the opportunity to obtain 3D measurements of patient position throughout IMRT and 3D treatments without excessive radiation exposure.  I am ordering optical surface tracking for this patient's upcoming course of radiotherapy. ________________________________  Special treatment procedure  was performed today due to the extra time and effort required by myself to plan and prepare this patient for deep inspiration breath hold technique.  I have determined cardiac sparing to be of benefit to this patient to prevent long term cardiac damage due to radiation of the heart.  Bellows were placed on the patient's abdomen. To facilitate cardiac sparing, the patient was coached by the radiation therapists on breath hold techniques and breathing practice was performed. Practice waveforms were obtained. The patient was then scanned while maintaining breath hold in the treatment position.  This image was then transferred over to the imaging specialist. The imaging specialist then created a fusion of the free breathing and breath hold scans using the chest wall as the stable structure. I personally reviewed the fusion in axial, coronal and sagittal image planes.  Excellent cardiac sparing was obtained.  I felt the patient is an appropriate candidate for breath hold and the patient will be treated as such.  The image fusion was then reviewed with the patient to reinforce the necessity of reproducible breath hold.   Blair Promise, PhD, MD  This document serves as a record of services personally performed by [name], MD. It was created on his behalf  by Clerance Lav, a trained medical scribe. The creation of this record is based on the scribe's personal observations and the provider's statements to them. This document has been checked and approved  by the attending provider.

## 2019-08-16 NOTE — Progress Notes (Signed)
Radiation Oncology         [phone]) [phone] ________________________________  Name: Lauren Osborne MRN: [id number]  Date: 08/16/2019  DOB: 08-08-1955  Re-Evaluation Note  CC: [name], MD  Magrinat, Virgie Dad, MD    ICD-10-CM   1. Malignant neoplasm of upper-inner quadrant of left breast in female, estrogen receptor positive ([place])  C50.212    Z17.0   2. Perennial allergic rhinitis with a predominantly nonallergic component  J30.89     Diagnosis: Stage IA Left Breast UOQ, Invasive Ductal Carcinoma with DCIS, ER+ / PR+ / Her2 -, Grade 2  Narrative:  The patient returns today to discuss radiation treatment options. She was seen in the multidisciplinary breast clinic on 04/28/2019. At that time, it was recommended that the patient proceed with left lumpectomy with sentinel node biopsy, adjuvant radiation therapy, and aromatase inhibitor.  Of note, the patients treatment was delayed because she was diagnosed with COVID on 05/31/2019.  She underwent left breast lumpectomy with sentinel lymph node biopsy on 07/02/2019 performed by Dr. Marlou Starks. Pathology from the procedure showed invasive and in situ ductal carcinoma without marginal involvement. Three left axillary sentinel lymph nodes were biopsied and were all negative for metastatic cancer. One additional medial margin was excised and biopsied and showed benign breast tissue.  The patient was follow-up with radiation oncology on 07/19/2019. However, Dr. Jana Hakim requested Oncotype testing, so her appointment was rescheduled. Oncotype DX was obtained on the final surgical sample and the recurrence score of 10 predicts a risk of recurrence outside the breast over the next 9 years of 3%, if the patient's only systemic therapy is an antiestrogen for 5 years. It also predicts no significant benefit from chemotherapy.  On review of systems, the patient reports generalized "aches and pain". She denies breast pain, lymphedema, and any other  symptoms.    Allergies:  is allergic to nitrofurantoin; cephalexin; ciprofloxacin; and sulfa antibiotics.  Meds: Current Outpatient Medications  Medication Sig Dispense Refill  . aspirin-acetaminophen-caffeine (EXCEDRIN MIGRAINE) 250-250-65 MG tablet Take by mouth as needed.     [name] azelastine (OPTIVAR) 0.05 % ophthalmic solution Place 1 drop into both eyes 2 (two) times daily. 6 mL 5  . Carbinoxamine Maleate 4 MG TABS TAKE 1 TABLET BY MOUTH EVERY 8 HOURS AS NEEDED 30 tablet 0  . diazepam (VALIUM) 10 MG tablet 10 mg at bedtime. Uses vaginally for pain    . ibuprofen (ADVIL,MOTRIN) 200 MG tablet Take 200 mg by mouth every 6 (six) hours as needed.    [name] ipratropium (ATROVENT) 0.03 % nasal spray Place 2 sprays into both nostrils every 12 (twelve) hours.    [name] omeprazole (PRILOSEC) 20 MG capsule TAKE 1 CAPSULE BY MOUTH EVERY DAY (Patient taking differently: TAKE 1 CAPSULE BY MOUTH EVERY DAY, as needed) 30 capsule 1  . solifenacin (VESICARE) 5 MG tablet Take 1 tablet (5 mg total) by mouth as needed. 90 tablet 4  . HYDROcodone-acetaminophen (NORCO/VICODIN) 5-325 MG tablet Take 1-2 tablets by mouth every 6 (six) hours as needed for moderate pain or severe pain. (Patient not taking: Reported on 08/16/2019) 10 tablet 0   No current facility-administered medications for this encounter.    Physical Findings: The patient is in no acute distress. Patient is alert and oriented.  height is 5' 10"  (1.778 m) and weight is 199 lb 8 oz (90.5 kg). Her temporal temperature is 98.3 F (36.8 C). Her blood pressure is 133/70 and her pulse is 76. Her respiration is 18  and oxygen saturation is 98%.  No significant changes. Lungs are clear to auscultation bilaterally. Heart has regular rate and rhythm. No palpable cervical, supraclavicular, or axillary adenopathy. Abdomen soft, non-tender, normal bowel sounds. Right breast: no palpable mass, nipple discharge or bleeding. Left breast: Well-healed periareolar scar.  No  dominant mass appreciated breast nipple discharge or bleeding.  Patient has a separate scar in the axillary region from her sentinel node procedure which is also healed well.  Lab Findings: Lab Results  Component Value Date   WBC 6.8 04/28/2019   HGB 13.9 04/28/2019   HCT 42.5 04/28/2019   MCV 90.6 04/28/2019   PLT 299 04/28/2019    Radiographic Findings: No results found.  Impression: Stage IA Left Breast UOQ, Invasive Ductal Carcinoma with DCIS, ER+ / PR+ / Her2 -, Grade 2 (pT1c, pN0)  Patient will be a good candidate for breast conservation with adjuvant radiation therapy as a component.  I discussed the general course of treatment side effects and potential toxicities of radiation therapy in the situation with the patient.  She appears to understand and wishes to proceed with planned course of treatment.  Plan:  Patient is scheduled for CT simulation tomorrow.  Treatments to begin approximately 1 week.  She would appear to be a good candidate for hypofractionated accelerated radiation therapy over approximately 4 weeks.  -----------------------------------  Blair Promise, PhD, MD  This document serves as a record of services personally performed by [name], MD. It was created on his behalf by Clerance Lav, a trained medical scribe. The creation of this record is based on the scribe's personal observations and the provider's statements to them. This document has been checked and approved by the attending provider.

## 2019-08-16 NOTE — Patient Instructions (Signed)
Coronavirus (COVID-19) Are you at risk?  Are you at risk for the Coronavirus (COVID-19)?  To be considered HIGH RISK for Coronavirus (COVID-19), you have to meet the following criteria:  . Traveled to [place], [place], [place], [place] or [place]; or in the [place] to [place], [place], [place], or [place]; and have fever, cough, and shortness of breath within the last 2 weeks of travel OR . Been in close contact with a person diagnosed with COVID-19 within the last 2 weeks and have fever, cough, and shortness of breath . IF YOU DO NOT MEET THESE CRITERIA, YOU ARE CONSIDERED LOW RISK FOR COVID-19.  What to do if you are HIGH RISK for COVID-19?  . If you are having a medical emergency, call 911. . Seek medical care right away. Before you go to a doctor's office, urgent care or emergency department, call ahead and tell them about your recent travel, contact with someone diagnosed with COVID-19, and your symptoms. You should receive instructions from your physician's office regarding next steps of care.  . When you arrive at healthcare provider, tell the healthcare staff immediately you have returned from visiting [place], [place], [place], [place] or [place]; or traveled in the [place] to [place], [place], [place], or [place]; in the last two weeks or you have been in close contact with a person diagnosed with COVID-19 in the last 2 weeks.   . Tell the health care staff about your symptoms: fever, cough and shortness of breath. . After you have been seen by a medical provider, you will be either: o Tested for (COVID-19) and discharged home on quarantine except to seek medical care if symptoms worsen, and asked to  - Stay home and avoid contact with others until you get your results (4-5 days)  - Avoid travel on public transportation if possible (such as bus, train, or airplane) or o Sent to the Emergency Department by EMS for evaluation, COVID-19 testing, and possible  admission depending on your condition and test results.  What to do if you are LOW RISK for COVID-19?  Reduce your risk of any infection by using the same precautions used for avoiding the common cold or flu:  . Wash your hands often with soap and warm water for at least 20 seconds.  If soap and water are not readily available, use an alcohol-based hand sanitizer with at least 60% alcohol.  . If coughing or sneezing, cover your mouth and nose by coughing or sneezing into the elbow areas of your shirt or coat, into a tissue or into your sleeve (not your hands). . Avoid shaking hands with others and consider head nods or verbal greetings only. . Avoid touching your eyes, nose, or mouth with unwashed hands.  . Avoid close contact with people who are sick. . Avoid places or events with large numbers of people in one location, like concerts or sporting events. . Carefully consider travel plans you have or are making. . If you are planning any travel outside or inside the [place], visit the CDC's Travelers' Health webpage for the latest health notices. . If you have some symptoms but not all symptoms, continue to monitor at home and seek medical attention if your symptoms worsen. . If you are having a medical emergency, call 911.   ADDITIONAL HEALTHCARE OPTIONS FOR PATIENTS  [facility] / e-Visit: [email or web address]         [facility]: [phone]  [facility]: [phone]                   [facility]: [phone]   

## 2019-08-17 ENCOUNTER — Ambulatory Visit
Admission: RE | Admit: 2019-08-17 | Discharge: 2019-08-17 | Disposition: A | Discharge status: Home / Self Care | Payer: BC Managed Care – PPO | Source: Ambulatory Visit | Attending: Radiation Oncology | Admitting: Radiation Oncology

## 2019-08-17 ENCOUNTER — Other Ambulatory Visit: Payer: Self-pay

## 2019-08-17 DIAGNOSIS — Z170 Estrogen receptor positive status [ER+]: Secondary | ICD-10-CM | POA: Diagnosis not present

## 2019-08-17 DIAGNOSIS — C50212 Malignant neoplasm of upper-inner quadrant of left female breast: Secondary | ICD-10-CM

## 2019-08-17 DIAGNOSIS — Z510 Encounter for antineoplastic radiation therapy: Secondary | ICD-10-CM | POA: Insufficient documentation

## 2019-08-17 DIAGNOSIS — D0512 Intraductal carcinoma in situ of left breast: Secondary | ICD-10-CM | POA: Insufficient documentation

## 2019-08-18 ENCOUNTER — Encounter: Payer: BC Managed Care – PPO | Admitting: Rehabilitation

## 2019-08-20 DIAGNOSIS — Z510 Encounter for antineoplastic radiation therapy: Secondary | ICD-10-CM | POA: Diagnosis not present

## 2019-08-23 ENCOUNTER — Encounter: Payer: Self-pay | Admitting: *Deleted

## 2019-08-23 ENCOUNTER — Telehealth: Payer: Self-pay | Admitting: Oncology

## 2019-08-23 NOTE — Telephone Encounter (Signed)
Scheduled appt per 4/12 sch message - pt is aware of appt date and time.  

## 2019-08-23 NOTE — Progress Notes (Signed)
  Radiation Oncology         [phone]) [phone] ________________________________  Name: Lauren Osborne MRN: [id number]  Date: 08/24/2019  DOB: 1956/02/02  Simulation Verification Note    ICD-10-CM   1. Malignant neoplasm of upper-inner quadrant of left breast in female, estrogen receptor positive ([place])  C50.212    Z17.0     NARRATIVE: The patient was brought to the treatment unit and placed in the planned treatment position. The clinical setup was verified. Then port films were obtained and uploaded to the radiation oncology medical record software.  The treatment beams were carefully compared against the planned radiation fields. The position location and shape of the radiation fields was reviewed. They targeted volume of tissue appears to be appropriately covered by the radiation beams. Organs at risk appear to be excluded as planned.  Based on my personal review, I approved the simulation verification. The patient's treatment will proceed as planned.  -----------------------------------  Blair Promise, PhD, MD  This document serves as a record of services personally performed by [name], MD. It was created on his behalf by Clerance Lav, a trained medical scribe. The creation of this record is based on the scribe's personal observations and the provider's statements to them. This document has been checked and approved by the attending provider.

## 2019-08-24 ENCOUNTER — Other Ambulatory Visit: Payer: Self-pay

## 2019-08-24 ENCOUNTER — Ambulatory Visit
Admission: RE | Admit: 2019-08-24 | Discharge: 2019-08-24 | Disposition: A | Discharge status: Home / Self Care | Payer: BC Managed Care – PPO | Source: Ambulatory Visit | Attending: Radiation Oncology | Admitting: Radiation Oncology

## 2019-08-24 DIAGNOSIS — C50212 Malignant neoplasm of upper-inner quadrant of left female breast: Secondary | ICD-10-CM

## 2019-08-24 DIAGNOSIS — Z170 Estrogen receptor positive status [ER+]: Secondary | ICD-10-CM

## 2019-08-24 DIAGNOSIS — Z510 Encounter for antineoplastic radiation therapy: Secondary | ICD-10-CM | POA: Diagnosis not present

## 2019-08-25 ENCOUNTER — Other Ambulatory Visit: Payer: Self-pay

## 2019-08-25 ENCOUNTER — Ambulatory Visit
Admission: RE | Admit: 2019-08-25 | Discharge: 2019-08-25 | Disposition: A | Discharge status: Home / Self Care | Payer: BC Managed Care – PPO | Source: Ambulatory Visit | Attending: Radiation Oncology | Admitting: Radiation Oncology

## 2019-08-25 DIAGNOSIS — Z510 Encounter for antineoplastic radiation therapy: Secondary | ICD-10-CM | POA: Diagnosis not present

## 2019-08-26 ENCOUNTER — Ambulatory Visit
Admission: RE | Admit: 2019-08-26 | Discharge: 2019-08-26 | Disposition: A | Discharge status: Home / Self Care | Payer: BC Managed Care – PPO | Source: Ambulatory Visit | Attending: Radiation Oncology | Admitting: Radiation Oncology

## 2019-08-26 ENCOUNTER — Other Ambulatory Visit: Payer: Self-pay

## 2019-08-26 DIAGNOSIS — Z510 Encounter for antineoplastic radiation therapy: Secondary | ICD-10-CM | POA: Diagnosis not present

## 2019-08-27 ENCOUNTER — Ambulatory Visit
Admission: RE | Admit: 2019-08-27 | Discharge: 2019-08-27 | Disposition: A | Discharge status: Home / Self Care | Payer: BC Managed Care – PPO | Source: Ambulatory Visit | Attending: Radiation Oncology | Admitting: Radiation Oncology

## 2019-08-27 DIAGNOSIS — Z510 Encounter for antineoplastic radiation therapy: Secondary | ICD-10-CM | POA: Diagnosis not present

## 2019-08-30 ENCOUNTER — Ambulatory Visit
Admission: RE | Admit: 2019-08-30 | Discharge: 2019-08-30 | Disposition: A | Discharge status: Home / Self Care | Payer: BC Managed Care – PPO | Source: Ambulatory Visit | Attending: Radiation Oncology | Admitting: Radiation Oncology

## 2019-08-30 ENCOUNTER — Other Ambulatory Visit: Payer: Self-pay

## 2019-08-30 DIAGNOSIS — Z510 Encounter for antineoplastic radiation therapy: Secondary | ICD-10-CM | POA: Diagnosis not present

## 2019-08-31 ENCOUNTER — Ambulatory Visit
Admission: RE | Admit: 2019-08-31 | Discharge: 2019-08-31 | Disposition: A | Discharge status: Home / Self Care | Payer: BC Managed Care – PPO | Source: Ambulatory Visit | Attending: Radiation Oncology | Admitting: Radiation Oncology

## 2019-08-31 ENCOUNTER — Other Ambulatory Visit: Payer: Self-pay

## 2019-08-31 DIAGNOSIS — Z510 Encounter for antineoplastic radiation therapy: Secondary | ICD-10-CM | POA: Diagnosis not present

## 2019-09-01 ENCOUNTER — Other Ambulatory Visit: Payer: Self-pay

## 2019-09-01 ENCOUNTER — Ambulatory Visit
Admission: RE | Admit: 2019-09-01 | Discharge: 2019-09-01 | Disposition: A | Discharge status: Home / Self Care | Payer: BC Managed Care – PPO | Source: Ambulatory Visit | Attending: Radiation Oncology | Admitting: Radiation Oncology

## 2019-09-01 DIAGNOSIS — Z510 Encounter for antineoplastic radiation therapy: Secondary | ICD-10-CM | POA: Diagnosis not present

## 2019-09-02 ENCOUNTER — Ambulatory Visit
Admission: RE | Admit: 2019-09-02 | Discharge: 2019-09-02 | Disposition: A | Discharge status: Home / Self Care | Payer: BC Managed Care – PPO | Source: Ambulatory Visit | Attending: Radiation Oncology | Admitting: Radiation Oncology

## 2019-09-02 ENCOUNTER — Other Ambulatory Visit: Payer: Self-pay

## 2019-09-02 DIAGNOSIS — Z510 Encounter for antineoplastic radiation therapy: Secondary | ICD-10-CM | POA: Diagnosis not present

## 2019-09-03 ENCOUNTER — Other Ambulatory Visit: Payer: Self-pay

## 2019-09-03 ENCOUNTER — Ambulatory Visit
Admission: RE | Admit: 2019-09-03 | Discharge: 2019-09-03 | Disposition: A | Discharge status: Home / Self Care | Payer: BC Managed Care – PPO | Source: Ambulatory Visit | Attending: Radiation Oncology | Admitting: Radiation Oncology

## 2019-09-03 DIAGNOSIS — Z510 Encounter for antineoplastic radiation therapy: Secondary | ICD-10-CM | POA: Diagnosis not present

## 2019-09-06 ENCOUNTER — Other Ambulatory Visit: Payer: Self-pay

## 2019-09-06 ENCOUNTER — Ambulatory Visit
Admission: RE | Admit: 2019-09-06 | Discharge: 2019-09-06 | Disposition: A | Discharge status: Home / Self Care | Payer: BC Managed Care – PPO | Source: Ambulatory Visit | Attending: Radiation Oncology | Admitting: Radiation Oncology

## 2019-09-06 DIAGNOSIS — Z510 Encounter for antineoplastic radiation therapy: Secondary | ICD-10-CM | POA: Diagnosis not present

## 2019-09-07 ENCOUNTER — Ambulatory Visit: Payer: BC Managed Care – PPO | Admitting: Radiation Oncology

## 2019-09-07 ENCOUNTER — Ambulatory Visit
Admission: RE | Admit: 2019-09-07 | Discharge: 2019-09-07 | Disposition: A | Discharge status: Home / Self Care | Payer: BC Managed Care – PPO | Source: Ambulatory Visit | Attending: Radiation Oncology | Admitting: Radiation Oncology

## 2019-09-07 ENCOUNTER — Other Ambulatory Visit: Payer: Self-pay

## 2019-09-07 DIAGNOSIS — C50212 Malignant neoplasm of upper-inner quadrant of left female breast: Secondary | ICD-10-CM

## 2019-09-07 DIAGNOSIS — Z170 Estrogen receptor positive status [ER+]: Secondary | ICD-10-CM

## 2019-09-07 DIAGNOSIS — Z510 Encounter for antineoplastic radiation therapy: Secondary | ICD-10-CM | POA: Diagnosis not present

## 2019-09-07 MED ORDER — SONAFINE EX EMUL
1.0000 "application " | Freq: Two times a day (BID) | CUTANEOUS | Status: DC
  Administered 2019-09-07: 1 via TOPICAL

## 2019-09-08 ENCOUNTER — Other Ambulatory Visit: Payer: Self-pay

## 2019-09-08 ENCOUNTER — Ambulatory Visit
Admission: RE | Admit: 2019-09-08 | Discharge: 2019-09-08 | Disposition: A | Discharge status: Home / Self Care | Payer: BC Managed Care – PPO | Source: Ambulatory Visit | Attending: Radiation Oncology | Admitting: Radiation Oncology

## 2019-09-08 DIAGNOSIS — Z510 Encounter for antineoplastic radiation therapy: Secondary | ICD-10-CM | POA: Diagnosis not present

## 2019-09-09 ENCOUNTER — Ambulatory Visit
Admission: RE | Admit: 2019-09-09 | Discharge: 2019-09-09 | Disposition: A | Discharge status: Home / Self Care | Payer: BC Managed Care – PPO | Source: Ambulatory Visit | Attending: Radiation Oncology | Admitting: Radiation Oncology

## 2019-09-09 ENCOUNTER — Other Ambulatory Visit: Payer: Self-pay

## 2019-09-09 DIAGNOSIS — Z510 Encounter for antineoplastic radiation therapy: Secondary | ICD-10-CM | POA: Diagnosis not present

## 2019-09-10 ENCOUNTER — Other Ambulatory Visit: Payer: Self-pay

## 2019-09-10 ENCOUNTER — Ambulatory Visit
Admission: RE | Admit: 2019-09-10 | Discharge: 2019-09-10 | Disposition: A | Discharge status: Home / Self Care | Payer: BC Managed Care – PPO | Source: Ambulatory Visit | Attending: Radiation Oncology | Admitting: Radiation Oncology

## 2019-09-10 DIAGNOSIS — Z510 Encounter for antineoplastic radiation therapy: Secondary | ICD-10-CM | POA: Diagnosis not present

## 2019-09-13 ENCOUNTER — Ambulatory Visit
Admission: RE | Admit: 2019-09-13 | Discharge: 2019-09-13 | Disposition: A | Discharge status: Home / Self Care | Payer: BC Managed Care – PPO | Source: Ambulatory Visit | Attending: Radiation Oncology | Admitting: Radiation Oncology

## 2019-09-13 ENCOUNTER — Other Ambulatory Visit: Payer: Self-pay

## 2019-09-13 DIAGNOSIS — Z510 Encounter for antineoplastic radiation therapy: Secondary | ICD-10-CM | POA: Insufficient documentation

## 2019-09-13 DIAGNOSIS — Z170 Estrogen receptor positive status [ER+]: Secondary | ICD-10-CM | POA: Insufficient documentation

## 2019-09-13 DIAGNOSIS — D0512 Intraductal carcinoma in situ of left breast: Secondary | ICD-10-CM | POA: Diagnosis present

## 2019-09-14 ENCOUNTER — Other Ambulatory Visit: Payer: Self-pay

## 2019-09-14 ENCOUNTER — Ambulatory Visit
Admission: RE | Admit: 2019-09-14 | Discharge: 2019-09-14 | Disposition: A | Discharge status: Home / Self Care | Payer: BC Managed Care – PPO | Source: Ambulatory Visit | Attending: Radiation Oncology | Admitting: Radiation Oncology

## 2019-09-14 DIAGNOSIS — Z510 Encounter for antineoplastic radiation therapy: Secondary | ICD-10-CM | POA: Diagnosis not present

## 2019-09-15 ENCOUNTER — Other Ambulatory Visit: Payer: Self-pay

## 2019-09-15 ENCOUNTER — Ambulatory Visit
Admission: RE | Admit: 2019-09-15 | Discharge: 2019-09-15 | Disposition: A | Discharge status: Home / Self Care | Payer: BC Managed Care – PPO | Source: Ambulatory Visit | Attending: Radiation Oncology | Admitting: Radiation Oncology

## 2019-09-15 DIAGNOSIS — Z510 Encounter for antineoplastic radiation therapy: Secondary | ICD-10-CM | POA: Diagnosis not present

## 2019-09-16 ENCOUNTER — Ambulatory Visit
Admission: RE | Admit: 2019-09-16 | Discharge: 2019-09-16 | Disposition: A | Discharge status: Home / Self Care | Payer: BC Managed Care – PPO | Source: Ambulatory Visit | Attending: Radiation Oncology | Admitting: Radiation Oncology

## 2019-09-16 ENCOUNTER — Other Ambulatory Visit: Payer: Self-pay

## 2019-09-16 DIAGNOSIS — Z510 Encounter for antineoplastic radiation therapy: Secondary | ICD-10-CM | POA: Diagnosis not present

## 2019-09-17 ENCOUNTER — Ambulatory Visit
Admission: RE | Admit: 2019-09-17 | Discharge: 2019-09-17 | Disposition: A | Discharge status: Home / Self Care | Payer: BC Managed Care – PPO | Source: Ambulatory Visit | Attending: Radiation Oncology | Admitting: Radiation Oncology

## 2019-09-17 ENCOUNTER — Other Ambulatory Visit: Payer: Self-pay

## 2019-09-17 DIAGNOSIS — Z510 Encounter for antineoplastic radiation therapy: Secondary | ICD-10-CM | POA: Diagnosis not present

## 2019-09-20 ENCOUNTER — Ambulatory Visit
Admission: RE | Admit: 2019-09-20 | Discharge: 2019-09-20 | Disposition: A | Discharge status: Home / Self Care | Payer: BC Managed Care – PPO | Source: Ambulatory Visit | Attending: Radiation Oncology | Admitting: Radiation Oncology

## 2019-09-20 ENCOUNTER — Other Ambulatory Visit: Payer: Self-pay

## 2019-09-20 ENCOUNTER — Encounter: Payer: Self-pay | Admitting: Radiation Oncology

## 2019-09-20 ENCOUNTER — Encounter: Payer: Self-pay | Admitting: *Deleted

## 2019-09-20 DIAGNOSIS — Z170 Estrogen receptor positive status [ER+]: Secondary | ICD-10-CM

## 2019-09-20 DIAGNOSIS — C50212 Malignant neoplasm of upper-inner quadrant of left female breast: Secondary | ICD-10-CM

## 2019-09-20 DIAGNOSIS — Z510 Encounter for antineoplastic radiation therapy: Secondary | ICD-10-CM | POA: Diagnosis not present

## 2019-10-13 NOTE — Progress Notes (Incomplete)
  Patient Name: Lauren Osborne MRN: [id number] DOB: 04-13-1956 Referring Physician: Lurline Del (Profile Not Attached) Date of Service: 09/20/2019 [facility], [name]                                                        End Of Treatment Note  Diagnoses: C50.212-Malignant neoplasm of upper-inner quadrant of left female breast Z17.0-Estrogen receptor positive status [ER+]  Cancer Staging: StageIA LeftBreast UOQ,Invasive DuctalCarcinomawith DCIS, ER+/ PR+/ Her2 -, Grade2  Intent: Curative  Radiation Treatment Dates: 08/24/2019 through 09/20/2019 Site Technique Total Dose (Gy) Dose per Fx (Gy) Completed Fx Beam Energies  Breast, Left: Breast_Lt_Bst 3D 10/10 2 5/5 6X  Breast, Left: Breast_Lt 3D 40.05/40.05 2.67 15/15 6X, 10X   Narrative: The patient tolerated radiation therapy relatively well. She did report an occasional throbbing pain in the left breast that resolved. She denied fatigue. Midway through treatment, there was noted to be some mild radiation dermatitis along the upper inner aspect of the left breast. No skin breakdown. Towards the end of treatment, the patient had some hyperpigmentation changes noted to the skin with rash and itching.  Plan: The patient will follow-up with radiation oncology in one month.  ________________________________________________   Blair Promise, PhD, MD  This document serves as a record of services personally performed by [name], MD. It was created on his behalf by Clerance Lav, a trained medical scribe. The creation of this record is based on the scribe's personal observations and the provider's statements to them. This document has been checked and approved by the attending provider.

## 2019-10-20 ENCOUNTER — Other Ambulatory Visit: Payer: Self-pay

## 2019-10-20 ENCOUNTER — Inpatient Hospital Stay: Payer: BC Managed Care – PPO | Attending: Oncology | Admitting: Oncology

## 2019-10-20 VITALS — BP 119/45 | HR 78 | Temp 98.7°F | Resp 18 | Wt 194.0 lb

## 2019-10-20 DIAGNOSIS — Z170 Estrogen receptor positive status [ER+]: Secondary | ICD-10-CM | POA: Diagnosis not present

## 2019-10-20 DIAGNOSIS — C50212 Malignant neoplasm of upper-inner quadrant of left female breast: Secondary | ICD-10-CM | POA: Diagnosis present

## 2019-10-20 DIAGNOSIS — Z923 Personal history of irradiation: Secondary | ICD-10-CM | POA: Insufficient documentation

## 2019-10-20 DIAGNOSIS — K589 Irritable bowel syndrome without diarrhea: Secondary | ICD-10-CM | POA: Diagnosis not present

## 2019-10-20 DIAGNOSIS — Z79899 Other long term (current) drug therapy: Secondary | ICD-10-CM | POA: Insufficient documentation

## 2019-10-20 MED ORDER — TAMOXIFEN CITRATE 20 MG PO TABS: 20.0000 mg | ORAL_TABLET | Freq: Every day | ORAL | 4 refills | Status: AC

## 2019-10-20 NOTE — Progress Notes (Signed)
[facility]  [phone] [phone]     ID: Lauren Osborne DOB: November 24, 1955  MR#: [id number]  MOQ#:[id number]  Patient Care Team: [name], MD as PCP - General (Family Medicine) Mauro Kaufmann, RN as Oncology Nurse Navigator Rockwell Germany, RN as Oncology Nurse Navigator Jovita Kussmaul, MD as Consulting Physician (General Surgery) Gwenn Teodoro, Virgie Dad, MD as Consulting Physician (Oncology) [name], MD as Consulting Physician (Radiation Oncology) [name], MD as Consulting Physician (Gynecology) [name], MD as Consulting Physician (Urology) [name], MD as Consulting Physician (Ophthalmology) Melvenia Beam, MD as Consulting Physician (Neurology) [name], MD OTHER MD:  CHIEF COMPLAINT: Estrogen receptor positive breast cancer  CURRENT TREATMENT: Oxygen   INTERVAL HISTORY: Lauren Osborne returns today for follow up of her estrogen receptor positive breast cancer. She was evaluated in the multidisciplinary breast cancer clinic on 04/28/2019.  Since consultation, she opted to proceed with left lumpectomy on 07/02/2019 under Dr. Marlou Starks. Pathology from the procedure ([id number]) showed: invasive and in situ ductal carcinoma, 1.1 cm; margins not involved.  All three biopsied lymph nodes were negative for metastatic carcinoma (0/3).  Oncotype DX was obtained on the final surgical sample and the recurrence score of 10 predicts a risk of recurrence outside the breast over the next 9 years of 3%, if the patient's only systemic therapy is an antiestrogen for 5 years.  It also predicts no benefit from chemotherapy.  She was referred back to Dr. Sondra Come on 08/16/2019 to discuss adjuvant radiation therapy. She received treatment from 08/24/2019 through 09/20/2019.   REVIEW OF SYSTEMS: Lauren Osborne she did well with her surgery and also with radiation.  She had minimal desquamation.  Her energy did not decrease very much  she says.  She has had some issues regarding prior drugs, with antibiotics causing foot drop and cramps.  She has been evaluated by neurology with no evidence of myasthenia or multiple sclerosis.  A detailed review of systems today was otherwise stable  HISTORY OF CURRENT ILLNESS: From the original intake note:  Lauren Osborne had routine screening mammography on 04/14/2019 showing a possible abnormality in the bilateral breasts. She underwent bilateral diagnostic mammography with tomography and left breast ultrasonography at [facility] on 04/20/2019 showing: breast density category B; right breast with fibroglandular tissue without persistent mass or distortion; left breast with 0.5 cm irregular mass at 11 o'clock; no abnormal left axillary lymph nodes.  Accordingly on 04/22/2019 she proceeded to biopsy of the left breast area in question. The pathology from this procedure ([id number]) showed: invasive ductal carcinoma, grade 2; ductal carcinoma in situ. Prognostic indicators significant for: estrogen receptor, 95% positive with strong staining intensity and progesterone receptor, 50% positive with moderate staining intensity. Proliferation marker Ki67 at 5%. HER2 negative by immunohistochemistry (1+).  The patient's subsequent history is as detailed below.   PAST MEDICAL HISTORY: Past Medical History:  Diagnosis Date  . Abnormal Pap smear of cervix 12/2009   ASCUS cannot exclude HGSIL.  [name] Arthralgia of right hip 2015   Hip replacement   . Cancer ([place]) 04/2019   left breast cancer  . Complication of anesthesia   . Cystocele or rectocele with uterine prolapse   . Environmental and seasonal allergies   . Headache   . IBS (irritable bowel syndrome)   . Osteoarthritis   . PONV (postoperative nausea and vomiting)   . Rectocele   . Tourette's disorder 2015   Tourette's disorder with a  tic that is triggered by eating. [facility]     PAST SURGICAL HISTORY: Past Surgical History:    Procedure Laterality Date  . BREAST LUMPECTOMY WITH RADIOACTIVE SEED AND SENTINEL LYMPH NODE BIOPSY Left 07/02/2019   Procedure: LEFT BREAST LUMPECTOMY WITH RADIOACTIVE SEED AND SENTINEL LYMPH NODE BIOPSY;  Surgeon: Jovita Kussmaul, MD;  Location: [facility];  Service: General;  Laterality: Left;  . BUNIONECTOMY Right   . COLPOSCOPY  12/2009   negative  . EYE SURGERY     cataracts  . FOOT SURGERY Right    bone removed   . hardward removal from right foot  12/15   Dr. Wardell Honour  . JOINT REPLACEMENT Right 2001   Hip replacement   . Posterior vaginal repair  10/21/2012   Procedure: VAGINAL REPAIR POSTERIOR; Surgeon: Daneil Dolin, MD; Location: [facility]; Service: Gynecology;;  . SINOSCOPY    . TONSILLECTOMY    . UTERINE SUSPENSION  10/21/12   Procedure: UTEROSACRAL SUSPENSION; Surgeon: Daneil Dolin, MD; Location[facility] MAIN OR; Service: Gynecology;;  . VAGINAL HYSTERECTOMY  10/21/12   Procedure: HYSTERECTOMY VAGINAL; Surgeon: Daneil Dolin, MD; Location: [facility]; Service: Gynecology; Laterality: N/A; bilateral salpingectomy    FAMILY HISTORY: Family History  Problem Relation Age of Onset  . Rheum arthritis Mother   . Cancer Father   . Osteoarthritis Sister   . Osteoarthritis Brother   . Crohn's disease Son   . Allergic rhinitis Neg Hx   . Asthma Neg Hx   . Eczema Neg Hx   . Urticaria Neg Hx    Patient's father was 62 years old when he died from cancer, the patient does not know what type. The patient has little information on her father's family. Patient's mother died at age 64. The patient denies a family hx of breast or ovarian cancer. She has 3 maternal half-siblings, 2 half-brothers and 1 half-sister.   GYNECOLOGIC HISTORY:  Patient's last menstrual period was 05/13/2005. Menarche: 64 years old Age at first live birth: 64 years old Wiggins P 2 LMP age 64 Contraceptive: used for 6 months HRT no, but used a trial of local  cream  Hysterectomy? Yes, 10/2012 BSO? 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 She is married. Husband Lauren Osborne is retired from working in Engineer, technical sales. At home is just the two of them. Daughter Lauren Osborne, age 44, works in administration in [place]. Son Lauren Osborne, age 79, works in Engineer, technical sales in [place], [name]. Ardene has no grandchildren. She is not a Designer, fashion/clothing.    ADVANCED DIRECTIVES: in place   HEALTH MAINTENANCE: Social History   Tobacco Use  . Smoking status: Never Smoker  . Smokeless tobacco: Never Used  Substance Use Topics  . Alcohol use: Yes    Alcohol/week: 4.0 standard drinks    Types: 4 Glasses of wine per week    Comment: glass of wine with dinner  . Drug use: Never     Colonoscopy: 06/2014 (Dr. Carlton Adam), repeat in 10 years  PAP: 09/2016, negative  Bone density: yes, date unsure   Allergies  Allergen Reactions  . Nitrofurantoin Other (See Comments)  . Cephalexin Diarrhea  . Ciprofloxacin Diarrhea  . Sulfa Antibiotics Other (See Comments)    Stomach cramps and diarrhea    Current Outpatient Medications  Medication Sig Dispense Refill  . aspirin-acetaminophen-caffeine (EXCEDRIN MIGRAINE) 250-250-65 MG tablet Take by mouth as needed.     [name]  azelastine (OPTIVAR) 0.05 % ophthalmic solution Place 1 drop into both eyes 2 (two) times daily. 6 mL 5  . Carbinoxamine Maleate 4 MG TABS TAKE 1 TABLET BY MOUTH EVERY 8 HOURS AS NEEDED 30 tablet 0  . diazepam (VALIUM) 10 MG tablet 10 mg at bedtime. Uses vaginally for pain    . HYDROcodone-acetaminophen (NORCO/VICODIN) 5-325 MG tablet Take 1-2 tablets by mouth every 6 (six) hours as needed for moderate pain or severe pain. (Patient not taking: Reported on 08/16/2019) 10 tablet 0  . ibuprofen (ADVIL,MOTRIN) 200 MG tablet Take 200 mg by mouth every 6 (six) hours as needed.    [name] ipratropium (ATROVENT) 0.03 % nasal spray Place 2 sprays into both nostrils every 12 (twelve) hours.    [name]  omeprazole (PRILOSEC) 20 MG capsule TAKE 1 CAPSULE BY MOUTH EVERY DAY (Patient taking differently: TAKE 1 CAPSULE BY MOUTH EVERY DAY, as needed) 30 capsule 1  . solifenacin (VESICARE) 5 MG tablet Take 1 tablet (5 mg total) by mouth as needed. 90 tablet 4  . tamoxifen (NOLVADEX) 20 MG tablet Take 1 tablet (20 mg total) by mouth daily. 90 tablet 4   No current facility-administered medications for this visit.    OBJECTIVE:  white woman in no acute distress  Vitals:   10/20/19 1434  BP: (!) 119/45  Osborne: 78  Resp: 18  Temp: 98.7 F (37.1 C)  SpO2: 98%     Body mass index is 27.84 kg/m.   Wt Readings from Last 3 Encounters:  10/20/19 194 lb (88 kg)  08/16/19 199 lb 8 oz (90.5 kg)  07/02/19 200 lb 9.9 oz (91 kg)      ECOG FS:1 - Symptomatic but completely ambulatory  Sclerae unicteric, EOMs intact Wearing a mask No cervical or supraclavicular adenopathy Lungs no rales or rhonchi Heart regular rate and rhythm Abd soft, nontender, positive bowel sounds MSK no focal spinal tenderness, no upper extremity lymphedema Neuro: nonfocal, well oriented, appropriate affect Breasts: The right breast is unremarkable.  The left breast is status post lumpectomy and radiation.  Is smaller than the right by about 8 to 10%.  There is some skin coarsening.  There is minimal hyper pigmentation.  Otherwise there is no evidence of disease recurrence.  Both axillae are benign.   LAB RESULTS:  CMP     Component Value Date/Time   NA 141 04/28/2019 0842   NA 141 12/19/2017 0855   K 3.7 04/28/2019 0842   CL 104 04/28/2019 0842   CO2 29 04/28/2019 0842   GLUCOSE 72 04/28/2019 0842   BUN 17 04/28/2019 0842   BUN 14 12/19/2017 0855   CREATININE 0.86 04/28/2019 0842   CREATININE 0.85 07/06/2015 1108   CALCIUM 9.0 04/28/2019 0842   PROT 6.7 04/28/2019 0842   PROT 6.4 12/19/2017 0855   ALBUMIN 3.9 04/28/2019 0842   AST 17 04/28/2019 0842   ALT 17 04/28/2019 0842   ALKPHOS 81 04/28/2019 0842    BILITOT 0.4 04/28/2019 0842   GFRNONAA >60 04/28/2019 0842   GFRAA >60 04/28/2019 0842    No results found for: TOTALPROTELP, ALBUMINELP, A1GS, A2GS, BETS, BETA2SER, GAMS, MSPIKE, SPEI  No results found for: KPAFRELGTCHN, LAMBDASER, KAPLAMBRATIO  Lab Results  Component Value Date   WBC 6.8 04/28/2019   NEUTROABS 4.5 04/28/2019   HGB 13.9 04/28/2019   HCT 42.5 04/28/2019   MCV 90.6 04/28/2019   PLT 299 04/28/2019    No results found for: LABCA2  No components found for:  [id number]  No results for input(s): INR in the last 168 hours.  No results found for: LABCA2  No results found for: NKN397  No results found for: QBH419  No results found for: FXT024  No results found for: CA2729  No components found for: HGQUANT  No results found for: CEA1 / No results found for: CEA1   No results found for: AFPTUMOR  No results found for: CHROMOGRNA  No results found for: KPAFRELGTCHN, LAMBDASER, KAPLAMBRATIO (kappa/lambda light chains)  No results found for: HGBA, HGBA2QUANT, HGBFQUANT, HGBSQUAN (Hemoglobinopathy evaluation)   No results found for: LDH  No results found for: IRON, TIBC, IRONPCTSAT (Iron and TIBC)  No results found for: FERRITIN  Urinalysis    Component Value Date/Time   BILIRUBINUR N 01/27/2018 1555   PROTEINUR Negative 01/27/2018 1555   UROBILINOGEN 0.2 01/27/2018 1555   NITRITE POSITIVE 01/27/2018 1555   LEUKOCYTESUR Negative 01/27/2018 1555    STUDIES: No results found.    ELIGIBLE FOR AVAILABLE RESEARCH PROTOCOL: [place]  ASSESSMENT: 64 y.o. [place], [place] woman status post left breast upper inner quadrant biopsy 04/22/2019 for a clinical T1a N0, stage IA invasive ductal carcinoma, grade 2, estrogen and progesterone receptor positive, HER-2 not amplified, with an MIB-1 of 5%.  (1) status post left lumpectomy and sentinel lymph node sampling 07/02/2019 for a pT1c pN0, stage IA invasive ductal carcinoma, grade 2, with negative  margins  (a) a total of 3 sentinel lymph nodes were removed  (2) Oncotype score of 10 predicts a risk of recurrence outside the breast in the next 9 years of 3% if the patient's only systemic therapy is antiestrogens for 5 years.  It also predicts no benefit from chemotherapy.  (3) adjuvant radiation 08/24/2019 through 09/20/2019  Site Technique Total Dose (Gy) Dose per Fx (Gy) Completed Fx Beam Energies  Breast, Left: Breast_Lt_Bst 3D 10/10 2 5/5 6X  Breast, Left: Breast_Lt 3D 40.05/40.05 2.67 15/15 6X, 10X   (3) consider anastrozole   PLAN: We discussed the difference between tamoxifen and anastrozole in detail. She understands that anastrozole and the aromatase inhibitors in general work by blocking estrogen production. Accordingly vaginal dryness, decrease in bone density, and of course hot flashes can result. The aromatase inhibitors can also negatively affect the cholesterol profile, although that is a minor effect. One out of 5 women on aromatase inhibitors we will feel "old and achy". This arthralgia/myalgia syndrome, which resembles fibromyalgia clinically, does resolve with stopping the medications. Accordingly this is not a reason to not try an aromatase inhibitor but it is a frequent reason to stop it (in other words 20% of women will not be able to tolerate these medications).  Tamoxifen on the other hand does not block estrogen production. It does not "take away a woman's estrogen". It blocks the estrogen receptor in breast cells. Like anastrozole, it can also cause hot flashes. As opposed to anastrozole, tamoxifen has many estrogen-like effects. It is technically an estrogen receptor modulator. This means that in some tissues tamoxifen works like estrogen-- for example it helps strengthen the bones. It tends to improve the cholesterol profile. It can cause thickening of the endometrial lining, and even endometrial polyps or rarely cancer of the uterus.(The risk of uterine cancer due to  tamoxifen is one additional cancer per thousand women year). It can cause vaginal wetness or stickiness. It can cause blood clots through this estrogen-like effect--the risk of blood clots with tamoxifen is exactly the same as with birth control pills or hormone  replacement.  Neither of these agents causes mood changes or weight gain, despite the popular belief that they can have these side effects. We have data from studies comparing either of these drugs with placebo, and in those cases the control group had the same amount of weight gain and depression as the group that took the drug.  Since Lajuanda is status post hysterectomy, also took oral contraceptives in the past with no clots, and is already status post bilateral cataract removal, she is an excellent candidate for tamoxifen.  I went ahead and wrote the prescription for her and she will let me know if there is an issue.  I will see her again in early August.  Assuming all is well at that time we will start broadening the follow-up interval  Total encounter time 40 minutes.[name], MD   10/20/2019 3:05 PM Medical Oncology and Hematology [facility] [place], [place] [address] Tel. [phone]    Fax. [phone]   This document serves as a record of services personally performed by Lurline Del, MD. It was created on his behalf by Wilburn Mylar, a trained medical scribe. The creation of this record is based on the scribe's personal observations and the provider's statements to them.   I, Lurline Del MD, have reviewed the above documentation for accuracy and completeness, and I agree with the above.   *Total Encounter Time as defined by the Centers for Medicare and Medicaid Services includes, in addition to the face-to-face time of a patient visit (documented in the note above) non-face-to-face time: obtaining and reviewing outside history, ordering and reviewing medications, tests or  procedures, care coordination (communications with other health care professionals or caregivers) and documentation in the medical record.

## 2019-10-21 ENCOUNTER — Telehealth: Payer: Self-pay | Admitting: Oncology

## 2019-10-21 NOTE — Telephone Encounter (Signed)
Scheduled appts per 6/9 los. Left voicemail with appt date and time.

## 2019-10-25 ENCOUNTER — Ambulatory Visit
Admission: RE | Admit: 2019-10-25 | Discharge: 2019-10-25 | Disposition: A | Discharge status: Home / Self Care | Payer: BC Managed Care – PPO | Source: Ambulatory Visit | Attending: Radiation Oncology | Admitting: Radiation Oncology

## 2019-10-25 ENCOUNTER — Encounter: Payer: Self-pay | Admitting: Radiation Oncology

## 2019-10-25 ENCOUNTER — Other Ambulatory Visit: Payer: Self-pay

## 2019-10-25 DIAGNOSIS — Z170 Estrogen receptor positive status [ER+]: Secondary | ICD-10-CM | POA: Insufficient documentation

## 2019-10-25 DIAGNOSIS — D0512 Intraductal carcinoma in situ of left breast: Secondary | ICD-10-CM | POA: Insufficient documentation

## 2019-10-25 DIAGNOSIS — Z510 Encounter for antineoplastic radiation therapy: Secondary | ICD-10-CM | POA: Diagnosis present

## 2019-10-25 DIAGNOSIS — C50212 Malignant neoplasm of upper-inner quadrant of left female breast: Secondary | ICD-10-CM

## 2019-10-25 NOTE — Progress Notes (Signed)
Patient here for a 1 month f/u visit with Dr. Sondra Come.  Occasional discomfort in left breast. No problems with ROM or swelling. Has some fatigue that is normal for her.  BP 127/74 (BP Location: Right Arm)   Pulse 68   Temp (!) 97.5 F (36.4 C)   Resp 18   Ht 5' 10.5" (1.791 m)   Wt 191 lb 6.4 oz (86.8 kg)   LMP 05/13/2005   SpO2 100%   BMI 27.07 kg/m    Wt Readings from Last 3 Encounters:  10/25/19 191 lb 6.4 oz (86.8 kg)  10/20/19 194 lb (88 kg)  08/16/19 199 lb 8 oz (90.5 kg)

## 2019-12-14 ENCOUNTER — Inpatient Hospital Stay: Payer: BC Managed Care – PPO | Attending: Oncology | Admitting: Oncology

## 2019-12-14 ENCOUNTER — Other Ambulatory Visit: Payer: Self-pay

## 2019-12-14 VITALS — BP 123/72 | HR 72 | Temp 98.3°F | Resp 20 | Ht 70.5 in | Wt 192.3 lb

## 2019-12-14 DIAGNOSIS — M1990 Unspecified osteoarthritis, unspecified site: Secondary | ICD-10-CM | POA: Diagnosis not present

## 2019-12-14 DIAGNOSIS — C50212 Malignant neoplasm of upper-inner quadrant of left female breast: Secondary | ICD-10-CM | POA: Diagnosis present

## 2019-12-14 DIAGNOSIS — K589 Irritable bowel syndrome without diarrhea: Secondary | ICD-10-CM | POA: Diagnosis not present

## 2019-12-14 DIAGNOSIS — Z809 Family history of malignant neoplasm, unspecified: Secondary | ICD-10-CM | POA: Insufficient documentation

## 2019-12-14 DIAGNOSIS — Z79810 Long term (current) use of selective estrogen receptor modulators (SERMs): Secondary | ICD-10-CM | POA: Diagnosis not present

## 2019-12-14 DIAGNOSIS — Z170 Estrogen receptor positive status [ER+]: Secondary | ICD-10-CM | POA: Diagnosis not present

## 2019-12-14 DIAGNOSIS — N816 Rectocele: Secondary | ICD-10-CM | POA: Diagnosis not present

## 2019-12-14 DIAGNOSIS — F952 Tourette's disorder: Secondary | ICD-10-CM | POA: Diagnosis not present

## 2019-12-14 MED ORDER — TAMOXIFEN CITRATE 20 MG PO TABS: 20.0000 mg | ORAL_TABLET | Freq: Every day | ORAL | 12 refills | Status: AC

## 2019-12-14 NOTE — Progress Notes (Signed)
Lauren Osborne  [phone] Fax:([phone]) [phone]     ID: Lauren Osborne DOB: [birth year]  MR#: [id number]  NIO#:[id number]  Patient Care Team: [name], MD as PCP - General (Family Medicine) Mauro Kaufmann, RN as Oncology Nurse Navigator Rockwell Germany, RN as Oncology Nurse Navigator Jovita Kussmaul, MD as Consulting Physician (General Surgery) Magrinat, Virgie Dad, MD as Consulting Physician (Oncology) [name], MD as Consulting Physician (Radiation Oncology) [name], MD as Consulting Physician (Gynecology) [name], MD as Consulting Physician (Urology) [name], MD as Consulting Physician (Ophthalmology) Melvenia Beam, MD as Consulting Physician (Neurology) [name], MD OTHER MD:  CHIEF COMPLAINT: Estrogen receptor positive breast cancer  CURRENT TREATMENT: Tamoxifen   INTERVAL HISTORY: [name] returns today for follow up of her estrogen receptor positive breast cancer.  She started tamoxifen at her last visit on 10/20/2019.  She is tolerating it well, with no significant hot flashes or vaginal discharge.   REVIEW OF SYSTEMS: Lauren Osborne she is having some GI symptoms and is followed by digestive diseases in [place].  She has not seen her primary physician in quite some time.  She had the Pfizer vaccines without any difficulty.  Overall a detailed review of systems today was stable  HISTORY OF CURRENT ILLNESS: From the original intake note:  Lauren Osborne had routine screening mammography on 04/14/2019 showing a possible abnormality in the bilateral breasts. She underwent bilateral diagnostic mammography with tomography and left breast ultrasonography at [facility] on 04/20/2019 showing: breast density category B; right breast with fibroglandular tissue without persistent mass or distortion; left breast with 0.5 cm irregular mass at 11 o'clock; no abnormal left axillary lymph  nodes.  Accordingly on 04/22/2019 she proceeded to biopsy of the left breast area in question. The pathology from this procedure ([id number]) showed: invasive ductal carcinoma, grade 2; ductal carcinoma in situ. Prognostic indicators significant for: estrogen receptor, 95% positive with strong staining intensity and progesterone receptor, 50% positive with moderate staining intensity. Proliferation marker Ki67 at 5%. HER2 negative by immunohistochemistry (1+).  The patient's subsequent history is as detailed below.   PAST MEDICAL HISTORY: Past Medical History:  Diagnosis Date  . Abnormal Pap smear of cervix 12/2009   ASCUS cannot exclude HGSIL.  [name] Arthralgia of right hip 2015   Hip replacement   . Cancer ([place]) 04/2019   left breast cancer  . Complication of anesthesia   . Cystocele or rectocele with uterine prolapse   . Environmental and seasonal allergies   . Headache   . IBS (irritable bowel syndrome)   . Osteoarthritis   . PONV (postoperative nausea and vomiting)   . Rectocele   . 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 From [facility]     PAST SURGICAL HISTORY: Past Surgical History:  Procedure Laterality Date  . BREAST LUMPECTOMY WITH RADIOACTIVE SEED AND SENTINEL LYMPH NODE BIOPSY Left 07/02/2019   Procedure: LEFT BREAST LUMPECTOMY WITH RADIOACTIVE SEED AND SENTINEL LYMPH NODE BIOPSY;  Surgeon: Jovita Kussmaul, MD;  Location: [place];  Service: General;  Laterality: Left;  . BUNIONECTOMY Right   . COLPOSCOPY  12/2009   negative  . EYE SURGERY     cataracts  . FOOT SURGERY Right    bone removed   . hardward removal from right foot  12/15   Dr. Wardell Honour  . JOINT REPLACEMENT Right 2001   Hip replacement   .  Posterior vaginal repair  10/21/2012   Procedure: VAGINAL REPAIR POSTERIOR; Surgeon: Daneil Dolin, MD; Location: [place]; Service: Gynecology;;  . SINOSCOPY    . TONSILLECTOMY    . UTERINE  SUSPENSION  10/21/12   Procedure: UTEROSACRAL SUSPENSION; Surgeon: Daneil Dolin, MD; Location: [facility] MAIN OR; Service: Gynecology;;  . VAGINAL HYSTERECTOMY  10/21/12   Procedure: HYSTERECTOMY VAGINAL; Surgeon: Daneil Dolin, MD; Location: [place]; Service: Gynecology; Laterality: N/A; bilateral salpingectomy    FAMILY HISTORY: Family History  Problem Relation Age of Onset  . Rheum arthritis Mother   . Cancer Father   . Osteoarthritis Sister   . Osteoarthritis Brother   . Crohn's disease Son   . Allergic rhinitis Neg Hx   . Asthma Neg Hx   . Eczema Neg Hx   . Urticaria Neg Hx    Patient's father was 38 years old when he died from cancer, the patient does not know what type. The patient has little information on her father's family. Patient's mother died at age 29. The patient denies a family hx of breast or ovarian cancer. She has 3 maternal half-siblings, 2 half-brothers and 1 half-sister.   GYNECOLOGIC HISTORY:  Patient's last menstrual period was 05/13/2005. Menarche: 64 years old Age at first live birth: 64 years old [place] P 2 LMP age 32 Contraceptive: used for 6 months HRT no, but used a trial of local cream  Hysterectomy? Yes, 10/2012 BSO? 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 She is married. Husband [name] is retired from working in Engineer, technical sales. At home is just the two of them. Daughter Lauren Osborne, age 64, works in administration in [place]. Son Lauren Osborne, age 64, works in Engineer, technical sales in [place], [name]. [name] has no grandchildren. She is not a Designer, fashion/clothing.    ADVANCED DIRECTIVES: in place   HEALTH MAINTENANCE: Social History   Tobacco Use  . Smoking status: Never Smoker  . Smokeless tobacco: Never Used  Vaping Use  . Vaping Use: Never used  Substance Use Topics  . Alcohol use: Yes    Alcohol/week: 4.0 standard drinks    Types: 4 Glasses of wine per week    Comment: glass of wine with  dinner  . Drug use: Never     Colonoscopy: 06/2014 (Dr. Carlton Adam), repeat in 10 years  PAP: 09/2016, negative  Bone density: yes, date unsure   Allergies  Allergen Reactions  . Nitrofurantoin Other (See Comments)  . Cephalexin Diarrhea  . Ciprofloxacin Diarrhea  . Sulfa Antibiotics Other (See Comments)    Stomach cramps and diarrhea    Current Outpatient Medications  Medication Sig Dispense Refill  . aspirin-acetaminophen-caffeine (EXCEDRIN MIGRAINE) 250-250-65 MG tablet Take by mouth as needed.     [name] azelastine (OPTIVAR) 0.05 % ophthalmic solution Place 1 drop into both eyes 2 (two) times daily. 6 mL 5  . Carbinoxamine Maleate 4 MG TABS TAKE 1 TABLET BY MOUTH EVERY 8 HOURS AS NEEDED 30 tablet 0  . diazepam (VALIUM) 10 MG tablet 10 mg at bedtime. Uses vaginally for pain    . HYDROcodone-acetaminophen (NORCO/VICODIN) 5-325 MG tablet Take 1-2 tablets by mouth every 6 (six) hours as needed for moderate pain or severe pain. (Patient not taking: Reported on 08/16/2019) 10 tablet 0  . ibuprofen (ADVIL,MOTRIN) 200 MG tablet Take 200 mg by mouth every 6 (six) hours as needed.    [name] ipratropium (ATROVENT) 0.03 % nasal spray Place  2 sprays into both nostrils every 12 (twelve) hours.    [name] omeprazole (PRILOSEC) 20 MG capsule TAKE 1 CAPSULE BY MOUTH EVERY DAY (Patient taking differently: TAKE 1 CAPSULE BY MOUTH EVERY DAY, as needed) 30 capsule 1  . solifenacin (VESICARE) 5 MG tablet Take 1 tablet (5 mg total) by mouth as needed. 90 tablet 4  . tamoxifen (NOLVADEX) 20 MG tablet Take 1 tablet (20 mg total) by mouth daily. 90 tablet 12   No current facility-administered medications for this visit.    OBJECTIVE:  white woman who appears stated age  77:   12/14/19 1514  BP: 123/72  Pulse: 72  Resp: 20  Temp: 98.3 F (36.8 C)  SpO2: 99%     Body mass index is 27.2 kg/m.   Wt Readings from Last 3 Encounters:  12/14/19 192 lb 4.8 oz (87.2 kg)  10/25/19 191 lb 6.4 oz (86.8 kg)  10/20/19 194 lb  (88 kg)      ECOG FS:1 - Symptomatic but completely ambulatory  Sclerae unicteric, EOMs intact Wearing a mask No cervical or supraclavicular adenopathy Lungs no rales or rhonchi Heart regular rate and rhythm Abd soft, nontender, positive bowel sounds MSK no focal spinal tenderness, no upper extremity lymphedema Neuro: nonfocal, well oriented, appropriate affect Breasts: The right breast is benign.  On the left the patient has undergone lumpectomy and radiation.  There is no evidence of local recurrence.  Both axillae are benign.   LAB RESULTS:  CMP     Component Value Date/Time   NA 141 04/28/2019 0842   NA 141 12/19/2017 0855   K 3.7 04/28/2019 0842   CL 104 04/28/2019 0842   CO2 29 04/28/2019 0842   GLUCOSE 72 04/28/2019 0842   BUN 17 04/28/2019 0842   BUN 14 12/19/2017 0855   CREATININE 0.86 04/28/2019 0842   CREATININE 0.85 07/06/2015 1108   CALCIUM 9.0 04/28/2019 0842   PROT 6.7 04/28/2019 0842   PROT 6.4 12/19/2017 0855   ALBUMIN 3.9 04/28/2019 0842   AST 17 04/28/2019 0842   ALT 17 04/28/2019 0842   ALKPHOS 81 04/28/2019 0842   BILITOT 0.4 04/28/2019 0842   GFRNONAA >60 04/28/2019 0842   GFRAA >60 04/28/2019 0842    No results found for: TOTALPROTELP, ALBUMINELP, A1GS, A2GS, BETS, BETA2SER, GAMS, MSPIKE, SPEI  No results found for: KPAFRELGTCHN, LAMBDASER, KAPLAMBRATIO  Lab Results  Component Value Date   WBC 6.8 04/28/2019   NEUTROABS 4.5 04/28/2019   HGB 13.9 04/28/2019   HCT 42.5 04/28/2019   MCV 90.6 04/28/2019   PLT 299 04/28/2019    No results found for: LABCA2  No components found for: [id number]  No results for input(s): INR in the last 168 hours.  No results found for: LABCA2  No results found for: HAL937  No results found for: TKW409  No results found for: BDZ329  No results found for: CA2729  No components found for: HGQUANT  No results found for: CEA1 / No results found for: CEA1   No results found for: AFPTUMOR  No  results found for: CHROMOGRNA  No results found for: KPAFRELGTCHN, LAMBDASER, KAPLAMBRATIO (kappa/lambda light chains)  No results found for: HGBA, HGBA2QUANT, HGBFQUANT, HGBSQUAN (Hemoglobinopathy evaluation)   No results found for: LDH  No results found for: IRON, TIBC, IRONPCTSAT (Iron and TIBC)  No results found for: FERRITIN  Urinalysis    Component Value Date/Time   BILIRUBINUR N 01/27/2018 1555   PROTEINUR Negative 01/27/2018 1555   UROBILINOGEN  0.2 01/27/2018 1555   NITRITE POSITIVE 01/27/2018 1555   LEUKOCYTESUR Negative 01/27/2018 1555    STUDIES: No results found.    ELIGIBLE FOR AVAILABLE RESEARCH PROTOCOL: [place]  ASSESSMENT: 64 y.o. [place], [place] woman status post left breast upper inner quadrant biopsy 04/22/2019 for a clinical T1a N0, stage IA invasive ductal carcinoma, grade 2, estrogen and progesterone receptor positive, HER-2 not amplified, with an MIB-1 of 5%.  (1) status post left lumpectomy and sentinel lymph node sampling 07/02/2019 for a pT1c pN0, stage IA invasive ductal carcinoma, grade 2, with negative margins  (a) a total of 3 sentinel lymph nodes were removed  (2) Oncotype score of 10 predicts a risk of recurrence outside the breast in the next 9 years of 3% if the patient's only systemic therapy is antiestrogens for 5 years.  It also predicts no benefit from chemotherapy.  (3) adjuvant radiation 08/24/2019 through 09/20/2019  Site Technique Total Dose (Gy) Dose per Fx (Gy) Completed Fx Beam Energies  Breast, Left: Breast_Lt_Bst 3D 10/10 2 5/5 6X  Breast, Left: Breast_Lt 3D 40.05/40.05 2.67 15/15 6X, 10X   (3) tamoxifen started June 2021   PLAN: Allisson is now about a half a year out from definitive surgery for her breast cancer.  She is tolerating tamoxifen well and the plan will be to continue that a minimum of 5 years.  She is scheduled to see Dr. Marlou Starks sometime in September.  I am going to go ahead and schedule her for mammography  shortly before that visit.  She will then see me 6 months later, in March 2022  I have encouraged her to start a regular exercise program.  We discussed the fact that tamoxifen is helpful to the bones and also will allow her to use vaginal estrogens if necessary for vaginal atrophy symptoms.  She questions as so many patients do at this point on how will we know if the cancer comes back.  We discussed false positives and radiation exposure from screening scans which have not been shown to improve survival.  Certainly if she developed any suspicious symptom we would evaluated aggressively.  She knows to call for any other issue that may develop before the next visit  Total encounter time 25 minutes.[name], MD   12/14/2019 3:43 PM Medical Oncology and Hematology [facility] [place], [place] [address] Tel. [phone]    Fax. [phone]   This document serves as a record of services personally performed by Lurline Del, MD. It was created on his behalf by Wilburn Mylar, a trained medical scribe. The creation of this record is based on the scribe's personal observations and the provider's statements to them.   I, Lurline Del MD, have reviewed the above documentation for accuracy and completeness, and I agree with the above.   *Total Encounter Time as defined by the Centers for Medicare and Medicaid Services includes, in addition to the face-to-face time of a patient visit (documented in the note above) non-face-to-face time: obtaining and reviewing outside history, ordering and reviewing medications, tests or procedures, care coordination (communications with other health care professionals or caregivers) and documentation in the medical record.

## 2020-01-27 ENCOUNTER — Ambulatory Visit
Admission: RE | Admit: 2020-01-27 | Discharge: 2020-01-27 | Disposition: A | Discharge status: Home / Self Care | Payer: BC Managed Care – PPO | Source: Ambulatory Visit | Attending: Oncology | Admitting: Oncology

## 2020-01-27 ENCOUNTER — Other Ambulatory Visit: Payer: Self-pay

## 2020-01-27 DIAGNOSIS — C50212 Malignant neoplasm of upper-inner quadrant of left female breast: Secondary | ICD-10-CM

## 2020-01-27 DIAGNOSIS — Z170 Estrogen receptor positive status [ER+]: Secondary | ICD-10-CM

## 2020-01-27 HISTORY — DX: Personal history of irradiation: Z92.3

## 2020-02-28 ENCOUNTER — Encounter (HOSPITAL_COMMUNITY): Payer: Self-pay

## 2020-07-25 ENCOUNTER — Other Ambulatory Visit: Payer: Self-pay | Admitting: Oncology

## 2020-07-25 NOTE — Progress Notes (Signed)
Lauren Osborne  [phone] Fax:([phone]) [phone]     ID: [name] DOB: 06/06/1955  MR#: [id number]  YFV#:[id number]  Patient Care Team: [name], MD as PCP - General (Family Medicine) Mauro Kaufmann, RN as Oncology Nurse Navigator Rockwell Germany, RN as Oncology Nurse Navigator Jovita Kussmaul, MD as Consulting Physician (General Surgery) Magrinat, Virgie Dad, MD as Consulting Physician (Oncology) [name], MD as Consulting Physician (Radiation Oncology) [name], MD as Consulting Physician (Gynecology) [name], MD as Consulting Physician (Urology) [name], MD as Consulting Physician (Ophthalmology) Melvenia Beam, MD as Consulting Physician (Neurology) [name], MD OTHER MD:  CHIEF COMPLAINT: Estrogen receptor positive breast cancer  CURRENT TREATMENT: Tamoxifen   INTERVAL HISTORY: Lauren Osborne returns today for follow up of her estrogen receptor positive breast cancer.  She started tamoxifen on 10/20/2019.  She is tolerating it generally well, with no significant hot flashes or vaginal discharge.  Since her last visit, she underwent bilateral diagnostic mammography with tomography at [facility] on 01/27/2020 showing: breast density category B; no evidence of malignancy in either breast.    REVIEW OF SYSTEMS: Lauren Osborne has developed significant rosacea.  She saw her family practice doctor as well as made an appointment with dermatology for June.  She is not sure exactly what brought this on.  She was started on probiotics for vaginal issues by her urogynecologist in December.  She wonders if that could be it or perhaps tamoxifen could be the issue.  She has stopped pretty much all medications except tamoxifen at this point.  She was given MetroGel and a second cream she says neither of which worked.  She was given prednisone yesterday by family practice.  It has made the bumps a little less bumpy but her  face [place].   COVID 19 VACCINATION STATUS:    HISTORY OF CURRENT ILLNESS: From the original intake note:  Lauren Osborne had routine screening mammography on 04/14/2019 showing a possible abnormality in the bilateral breasts. She underwent bilateral diagnostic mammography with tomography and left breast ultrasonography at [facility] on 04/20/2019 showing: breast density category B; right breast with fibroglandular tissue without persistent mass or distortion; left breast with 0.5 cm irregular mass at 11 o'clock; no abnormal left axillary lymph nodes.  Accordingly on 04/22/2019 she proceeded to biopsy of the left breast area in question. The pathology from this procedure ([id number]) showed: invasive ductal carcinoma, grade 2; ductal carcinoma in situ. Prognostic indicators significant for: estrogen receptor, 95% positive with strong staining intensity and progesterone receptor, 50% positive with moderate staining intensity. Proliferation marker Ki67 at 5%. HER2 negative by immunohistochemistry (1+).  The patient's subsequent history is as detailed below.   PAST MEDICAL HISTORY: Past Medical History:  Diagnosis Date  . Abnormal Pap smear of cervix 12/2009   ASCUS cannot exclude HGSIL.  Lauren Osborne Arthralgia of right hip 2015   Hip replacement   . Cancer ([place]) 04/2019   left breast cancer  . Complication of anesthesia   . Cystocele or rectocele with uterine prolapse   . Environmental and seasonal allergies   . Headache   . IBS (irritable bowel syndrome)   . Osteoarthritis   . Personal history of radiation therapy   . PONV (postoperative nausea and vomiting)   . Rectocele   . 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 From [facility]     PAST SURGICAL HISTORY:  Past Surgical History:  Procedure Laterality Date  . BREAST LUMPECTOMY Left 07/02/2019  . BREAST LUMPECTOMY WITH RADIOACTIVE SEED AND SENTINEL LYMPH NODE BIOPSY Left 07/02/2019    Procedure: LEFT BREAST LUMPECTOMY WITH RADIOACTIVE SEED AND SENTINEL LYMPH NODE BIOPSY;  Surgeon: Jovita Kussmaul, MD;  Location: [facility];  Service: General;  Laterality: Left;  . BUNIONECTOMY Right   . COLPOSCOPY  12/2009   negative  . EYE SURGERY     cataracts  . FOOT SURGERY Right    bone removed   . hardward removal from right foot  12/15   Dr. Wardell Honour  . JOINT REPLACEMENT Right 2001   Hip replacement   . Posterior vaginal repair  10/21/2012   Procedure: VAGINAL REPAIR POSTERIOR; Surgeon: Daneil Dolin, MD; Location: [facility]; Service: Gynecology;;  . SINOSCOPY    . TONSILLECTOMY    . UTERINE SUSPENSION  10/21/12   Procedure: UTEROSACRAL SUSPENSION; Surgeon: Daneil Dolin, MD; Location: [facility] MAIN OR; Service: Gynecology;;  . VAGINAL HYSTERECTOMY  10/21/12   Procedure: HYSTERECTOMY VAGINAL; Surgeon: Daneil Dolin, MD; Location: [facility]; Service: Gynecology; Laterality: N/A; bilateral salpingectomy    FAMILY HISTORY: Family History  Problem Relation Age of Onset  . Rheum arthritis Mother   . Cancer Father   . Osteoarthritis Sister   . Osteoarthritis Brother   . Crohn's disease Son   . Allergic rhinitis Neg Hx   . Asthma Neg Hx   . Eczema Neg Hx   . Urticaria Neg Hx   Patient's father was 95 years old when he died from cancer, the patient does not know what type. The patient has little information on her father's family. Patient's mother died at age 87. The patient denies a family hx of breast or ovarian cancer. She has 3 maternal half-siblings, 2 half-brothers and 1 half-sister.   GYNECOLOGIC HISTORY:  Patient's last menstrual period was 05/13/2005. Menarche: 65 years old Age at first live birth: 65 years old [place] P 2 LMP age 66 Contraceptive: used for 6 months HRT no, but used a trial of local cream  Hysterectomy? Yes, 10/2012 BSO? 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 She is married. Husband [name] is retired from working in Engineer, technical sales. At home is just the two of them. Daughter Lauren Osborne, age 60, works in administration in [place]. Son Lauren Osborne, age 39, works in Engineer, technical sales in [place], [name]. Candis has no grandchildren. She is not a Designer, fashion/clothing.    ADVANCED DIRECTIVES: in place   HEALTH MAINTENANCE: Social History   Tobacco Use  . Smoking status: Never Smoker  . Smokeless tobacco: Never Used  Vaping Use  . Vaping Use: Never used  Substance Use Topics  . Alcohol use: Yes    Alcohol/week: 4.0 standard drinks    Types: 4 Glasses of wine per week    Comment: glass of wine with dinner  . Drug use: Never     Colonoscopy: 06/2014 (Dr. Carlton Adam), repeat in 10 years  PAP: 09/2016, negative  Bone density: yes, date unsure   Allergies  Allergen Reactions  . Nitrofurantoin Other (See Comments)  . Cephalexin Diarrhea  . Ciprofloxacin Diarrhea  . Sulfa Antibiotics Other (See Comments)    Stomach cramps and diarrhea    Current Outpatient Medications  Medication Sig Dispense Refill  . aspirin-acetaminophen-caffeine (EXCEDRIN MIGRAINE) 250-250-65 MG tablet Take by mouth as needed.     Lauren Osborne azelastine (  OPTIVAR) 0.05 % ophthalmic solution Place 1 drop into both eyes 2 (two) times daily. 6 mL 5  . Carbinoxamine Maleate 4 MG TABS TAKE 1 TABLET BY MOUTH EVERY 8 HOURS AS NEEDED 30 tablet 0  . diazepam (VALIUM) 10 MG tablet 10 mg at bedtime. Uses vaginally for pain    . HYDROcodone-acetaminophen (NORCO/VICODIN) 5-325 MG tablet Take 1-2 tablets by mouth every 6 (six) hours as needed for moderate pain or severe pain. (Patient not taking: Reported on 08/16/2019) 10 tablet 0  . ibuprofen (ADVIL,MOTRIN) 200 MG tablet Take 200 mg by mouth every 6 (six) hours as needed.    Lauren Osborne ipratropium (ATROVENT) 0.03 % nasal spray Place 2 sprays into both nostrils every 12 (twelve) hours.    Lauren Osborne omeprazole (PRILOSEC) 20 MG capsule TAKE 1 CAPSULE BY MOUTH EVERY DAY (Patient  taking differently: TAKE 1 CAPSULE BY MOUTH EVERY DAY, as needed) 30 capsule 1  . solifenacin (VESICARE) 5 MG tablet Take 1 tablet (5 mg total) by mouth as needed. 90 tablet 4   No current facility-administered medications for this visit.    OBJECTIVE:  white woman who appears stated age  49:   07/26/20 1323  BP: 125/73  Pulse: 77  Resp: 17  Temp: 98.1 F (36.7 C)  SpO2: 100%     Body mass index is 26.74 kg/m.   Wt Readings from Last 3 Encounters:  07/26/20 189 lb (85.7 kg)  12/14/19 192 lb 4.8 oz (87.2 kg)  10/25/19 191 lb 6.4 oz (86.8 kg)      ECOG FS:1 - Symptomatic but completely ambulatory  Sclerae unicteric, EOMs intact Significant rosacea bilaterally, with no pits No cervical or supraclavicular adenopathy Lungs no rales or rhonchi Heart regular rate and rhythm Abd soft, nontender, positive bowel sounds MSK no focal spinal tenderness, no upper extremity lymphedema Neuro: nonfocal, well oriented, appropriate affect Breasts: The right breast is unremarkable.  The left breast is status post lumpectomy followed by radiation with no evidence of local recurrence.  Both axillae are benign   LAB RESULTS:  CMP     Component Value Date/Time   NA 141 04/28/2019 0842   NA 141 12/19/2017 0855   K 3.7 04/28/2019 0842   CL 104 04/28/2019 0842   CO2 29 04/28/2019 0842   GLUCOSE 72 04/28/2019 0842   BUN 17 04/28/2019 0842   BUN 14 12/19/2017 0855   CREATININE 0.86 04/28/2019 0842   CREATININE 0.85 07/06/2015 1108   CALCIUM 9.0 04/28/2019 0842   PROT 6.7 04/28/2019 0842   PROT 6.4 12/19/2017 0855   ALBUMIN 3.9 04/28/2019 0842   AST 17 04/28/2019 0842   ALT 17 04/28/2019 0842   ALKPHOS 81 04/28/2019 0842   BILITOT 0.4 04/28/2019 0842   GFRNONAA >60 04/28/2019 0842   GFRAA >60 04/28/2019 0842    No results found for: TOTALPROTELP, ALBUMINELP, A1GS, A2GS, BETS, BETA2SER, GAMS, MSPIKE, SPEI  No results found for: KPAFRELGTCHN, LAMBDASER, KAPLAMBRATIO  Lab  Results  Component Value Date   WBC 16.1 (H) 07/26/2020   NEUTROABS 13.0 (H) 07/26/2020   HGB 14.3 07/26/2020   HCT 42.8 07/26/2020   MCV 89.9 07/26/2020   PLT 292 07/26/2020    No results found for: LABCA2  No components found for: [id number]  No results for input(s): INR in the last 168 hours.  No results found for: LABCA2  No results found for: YNW295  No results found for: AOZ308  No results found for: MVH846  No results found for:  EL3810  No components found for: HGQUANT  No results found for: CEA1 / No results found for: CEA1   No results found for: AFPTUMOR  No results found for: CHROMOGRNA  No results found for: KPAFRELGTCHN, LAMBDASER, KAPLAMBRATIO (kappa/lambda light chains)  No results found for: HGBA, HGBA2QUANT, HGBFQUANT, HGBSQUAN (Hemoglobinopathy evaluation)   No results found for: LDH  No results found for: IRON, TIBC, IRONPCTSAT (Iron and TIBC)  No results found for: FERRITIN  Urinalysis    Component Value Date/Time   BILIRUBINUR N 01/27/2018 1555   PROTEINUR Negative 01/27/2018 1555   UROBILINOGEN 0.2 01/27/2018 1555   NITRITE POSITIVE 01/27/2018 1555   LEUKOCYTESUR Negative 01/27/2018 1555    STUDIES: No results found.    ELIGIBLE FOR AVAILABLE RESEARCH PROTOCOL: [place]  ASSESSMENT: 65 y.o. [place], [place] woman status post left breast upper inner quadrant biopsy 04/22/2019 for a clinical T1a N0, stage IA invasive ductal carcinoma, grade 2, estrogen and progesterone receptor positive, HER-2 not amplified, with an MIB-1 of 5%.  (1) status post left lumpectomy and sentinel lymph node sampling 07/02/2019 for a pT1c pN0, stage IA invasive ductal carcinoma, grade 2, with negative margins  (a) a total of 3 sentinel lymph nodes were removed  (2) Oncotype score of 10 predicts a risk of recurrence outside the breast in the next 9 years of 3% if the patient's only systemic therapy is antiestrogens for 5 years.  It also predicts no  benefit from chemotherapy.  (3) adjuvant radiation 08/24/2019 through 09/20/2019  Site Technique Total Dose (Gy) Dose per Fx (Gy) Completed Fx Beam Energies  Breast, Left: Breast_Lt_Bst 3D 10/10 2 5/5 6X  Breast, Left: Breast_Lt 3D 40.05/40.05 2.67 15/15 6X, 10X   (3) tamoxifen started June 2021   PLAN: Khamil is now just over a year out from definitive surgery for her breast cancer with no evidence of disease recurrence.  This is favorable.  She is tolerating tamoxifen generally well.  I do not think this is going to be the cause of her rosacea but it is fine to stop it for a bit and give her some time to catch up.  I am prescribing doxycycline for her.  She will take it twice daily for a week and then call us.  If it is working we can consider going down to once a day or continuing at twice daily until it is completely cleared.  Once the rosacea has improved we will resume the tamoxifen.  I will see her again in September.  She knows to call for any other issue that may develop before then.  Total encounter time 25 minutes.   [name], MD   07/26/2020 1:35 PM Medical Oncology and Hematology [facility] [place], [place] [address] Tel. [phone]    Fax. [phone]   This document serves as a record of services personally performed by Lurline Del, MD. It was created on his behalf by Wilburn Mylar, a trained medical scribe. The creation of this record is based on the scribe's personal observations and the provider's statements to them.   I, Lurline Del MD, have reviewed the above documentation for accuracy and completeness, and I agree with the above.   *Total Encounter Time as defined by the Centers for Medicare and Medicaid Services includes, in addition to the face-to-face time of a patient visit (documented in the note above) non-face-to-face time: obtaining and reviewing outside history, ordering and reviewing medications,  tests or procedures, care coordination (  communications with other health care professionals or caregivers) and documentation in the medical record.

## 2020-07-26 ENCOUNTER — Inpatient Hospital Stay: Payer: BC Managed Care – PPO

## 2020-07-26 ENCOUNTER — Inpatient Hospital Stay: Payer: BC Managed Care – PPO | Attending: Oncology | Admitting: Oncology

## 2020-07-26 ENCOUNTER — Other Ambulatory Visit: Payer: Self-pay

## 2020-07-26 VITALS — BP 125/73 | HR 77 | Temp 98.1°F | Resp 17 | Ht 70.5 in | Wt 189.0 lb

## 2020-07-26 DIAGNOSIS — C50212 Malignant neoplasm of upper-inner quadrant of left female breast: Secondary | ICD-10-CM

## 2020-07-26 DIAGNOSIS — Z170 Estrogen receptor positive status [ER+]: Secondary | ICD-10-CM

## 2020-07-26 DIAGNOSIS — Z923 Personal history of irradiation: Secondary | ICD-10-CM | POA: Diagnosis not present

## 2020-07-26 DIAGNOSIS — L719 Rosacea, unspecified: Secondary | ICD-10-CM | POA: Diagnosis not present

## 2020-07-26 LAB — COMPREHENSIVE METABOLIC PANEL
ALT: 13 U/L (ref 0–44)
AST: 14 U/L — ABNORMAL LOW (ref 15–41)
Albumin: 4.1 g/dL (ref 3.5–5.0)
Alkaline Phosphatase: 69 U/L (ref 38–126)
Anion gap: 10 (ref 5–15)
BUN: 18 mg/dL (ref 8–23)
CO2: 29 mmol/L (ref 22–32)
Calcium: 9.8 mg/dL (ref 8.9–10.3)
Chloride: 104 mmol/L (ref 98–111)
Creatinine, Ser: 0.97 mg/dL (ref 0.44–1.00)
GFR, Estimated: >60 mL/min (ref >60)
Glucose, Bld: 114 mg/dL — ABNORMAL HIGH (ref 70–99)
Potassium: 4.1 mmol/L (ref 3.5–5.1)
Sodium: 143 mmol/L (ref 135–145)
Total Bilirubin: 0.3 mg/dL (ref 0.3–1.2)
Total Protein: 7.1 g/dL (ref 6.5–8.1)

## 2020-07-26 LAB — CBC WITH DIFFERENTIAL/PLATELET
Abs Immature Granulocytes: 0.05 10*3/uL (ref 0.00–0.07)
Basophils Absolute: 0 10*3/uL (ref 0.0–0.1)
Basophils Relative: 0 %
Eosinophils Absolute: 0 10*3/uL (ref 0.0–0.5)
Eosinophils Relative: 0 %
HCT: 42.8 % (ref 36.0–46.0)
Hemoglobin: 14.3 g/dL (ref 12.0–15.0)
Immature Granulocytes: 0 %
Lymphocytes Relative: 12 %
Lymphs Abs: 2 10*3/uL (ref 0.7–4.0)
MCH: 30 pg (ref 26.0–34.0)
MCHC: 33.4 g/dL (ref 30.0–36.0)
MCV: 89.9 fL (ref 80.0–100.0)
Monocytes Absolute: 1 10*3/uL (ref 0.1–1.0)
Monocytes Relative: 6 %
Neutro Abs: 13 10*3/uL — ABNORMAL HIGH (ref 1.7–7.7)
Neutrophils Relative %: 82 %
Platelets: 292 10*3/uL (ref 150–400)
RBC: 4.76 MIL/uL (ref 3.87–5.11)
RDW: 12.5 % (ref 11.5–15.5)
WBC: 16.1 10*3/uL — ABNORMAL HIGH (ref 4.0–10.5)
nRBC: 0 % (ref 0.0–0.2)

## 2020-07-26 MED ORDER — DOXYCYCLINE HYCLATE 100 MG PO TABS: 100.0000 mg | ORAL_TABLET | Freq: Two times a day (BID) | ORAL | 1 refills | Status: DC

## 2020-07-27 ENCOUNTER — Telehealth: Payer: Self-pay | Admitting: Oncology

## 2020-07-27 NOTE — Telephone Encounter (Signed)
Scheduled appts per 3/16 los. Pt aware.

## 2020-07-28 ENCOUNTER — Other Ambulatory Visit: Payer: Self-pay

## 2020-07-28 MED ORDER — FLUCONAZOLE 100 MG PO TABS: 100.0000 mg | ORAL_TABLET | Freq: Every day | ORAL | 0 refills | Status: DC

## 2020-08-03 ENCOUNTER — Other Ambulatory Visit: Payer: Self-pay

## 2020-08-03 ENCOUNTER — Telehealth: Payer: Self-pay

## 2020-08-03 NOTE — Telephone Encounter (Signed)
Returned call to pt. Pt was to call in and report if rosacea was improved at all. Pt stated it had not gotten much better. Per Dr. Virgie Dad note pt to continue taking  Doxycycline two times a day for another week. Pt asked how long to continue to hold tamoxifen, pt to continue to hold it until rosacea starts to improve. Pt verbalized understanding.

## 2020-08-03 NOTE — Telephone Encounter (Signed)
Returned call to pt regarding her rosacea and questions about taking doxycycline. Pt to continue taking the doxycycline twice daily since her rosacea has not improved. Per Dr Jana Hakim pt to continue to hold her tamoxifen until the rosacea starts to get better. Pt verbalized understanding.

## 2020-08-22 ENCOUNTER — Telehealth: Payer: Self-pay

## 2020-08-22 NOTE — Telephone Encounter (Signed)
Pt called stating her rosacea has still not cleared up and she is looking for guidance in terms of restarting tamoxifen. This LPN returned pt's call but no answer, LVM for pt to call [facility].

## 2020-08-24 ENCOUNTER — Telehealth: Payer: Self-pay

## 2020-08-24 NOTE — Telephone Encounter (Signed)
Pt called looking for guidance on when to start back on tamoxifen. Pt states despite abt, the rosacea has not improved; however, it has not worsened. Pt saw a dermatologist and states it seems no one has the answer for this condition. Pt would like to know when/if she should restart tamoxifen. Pt understands we will call her back with Dr Magrinat's recommendation.

## 2020-08-25 ENCOUNTER — Other Ambulatory Visit: Payer: Self-pay

## 2020-08-25 NOTE — Progress Notes (Signed)
RN spoke with patient after receiving MD recommendations regarding previous encounter.    Per MD, ok for patient to re-start Tamoxifen.  Regarding the Rosacea, recommendations to apply Metrogel to face daily.  Patient reports have this medication from months ago and will try this for a few weeks.   No further needs at this time.

## 2020-12-15 ENCOUNTER — Other Ambulatory Visit: Payer: Self-pay | Admitting: Oncology

## 2020-12-15 DIAGNOSIS — Z9889 Other specified postprocedural states: Secondary | ICD-10-CM

## 2020-12-15 DIAGNOSIS — Z853 Personal history of malignant neoplasm of breast: Secondary | ICD-10-CM

## 2021-01-11 HISTORY — PX: TOTAL HIP ARTHROPLASTY: SHX124

## 2021-01-12 ENCOUNTER — Other Ambulatory Visit: Payer: Self-pay | Admitting: *Deleted

## 2021-01-12 DIAGNOSIS — Z170 Estrogen receptor positive status [ER+]: Secondary | ICD-10-CM

## 2021-01-12 DIAGNOSIS — C50212 Malignant neoplasm of upper-inner quadrant of left female breast: Secondary | ICD-10-CM

## 2021-01-15 NOTE — Progress Notes (Addendum)
Lauren Osborne  [phone] [phone]     ID: Lauren Osborne DOB: 07-09-1955  MR#: [id number]  HAL#:[id number]  Patient Care Team: [name], MD as PCP - General (Family Medicine) Mauro Kaufmann, RN as Oncology Nurse Navigator Rockwell Germany, RN as Oncology Nurse Navigator Jovita Kussmaul, MD as Consulting Physician (General Surgery) Gulianna Hornsby, Virgie Dad, MD as Consulting Physician (Oncology) [name], MD as Consulting Physician (Radiation Oncology) [name], MD as Consulting Physician (Gynecology) [name], MD as Consulting Physician (Urology) [name], MD as Consulting Physician (Ophthalmology) Melvenia Beam, MD as Consulting Physician (Neurology) [name], MD OTHER MD:  CHIEF COMPLAINT: Estrogen receptor positive breast cancer  CURRENT TREATMENT: Tamoxifen   INTERVAL HISTORY: [name] returns today for follow up of her estrogen receptor positive breast cancer.  She started tamoxifen on 10/20/2019.  She does not complain of hot flashes or vaginal wetness.  Her renal problems are her constipation and joint pain and those are discussed below.  She is scheduled for annual mammography on 01/30/2021.   REVIEW OF SYSTEMS: Janie tells me she has a lot of joint pain.  She has seen rheumatology for this and they were not able to make a definitive diagnosis except for her arthritis.  She is trying to get away from nonsteroidals or any other painkillers and that may be 1 reason her pain is a little bit worse since before she was taking at least some ibuprofen daily.  She has been on meloxicam as well.  The other problem is constipation.  This has had a very thorough work-up by GI through [facility].  It is also longstanding and seems to be not related to diet.  She also hydrates herself very well.  She uses MiraLAX and stool softeners appropriately.  In short these are chronic issues which I do not think are  related to her breast cancer or her tamoxifen.  [facility].  Detailed review of systems today was otherwise stable   COVID 19 VACCINATION STATUS: Status post Pfizer x4 as of August 2020   HISTORY OF CURRENT ILLNESS: From the original intake note:  Lauren Osborne had routine screening mammography on 04/14/2019 showing a possible abnormality in the bilateral breasts. She underwent bilateral diagnostic mammography with tomography and left breast ultrasonography at [facility] on 04/20/2019 showing: breast density category B; right breast with fibroglandular tissue without persistent mass or distortion; left breast with 0.5 cm irregular mass at 11 o'clock; no abnormal left axillary lymph nodes.  Accordingly on 04/22/2019 she proceeded to biopsy of the left breast area in question. The pathology from this procedure ([id number]) showed: invasive ductal carcinoma, grade 2; ductal carcinoma in situ. Prognostic indicators significant for: estrogen receptor, 95% positive with strong staining intensity and progesterone receptor, 50% positive with moderate staining intensity. Proliferation marker Ki67 at 5%. HER2 negative by immunohistochemistry (1+).  The patient's subsequent history is as detailed below.   PAST MEDICAL HISTORY: Past Medical History:  Diagnosis Date   Abnormal Pap smear of cervix 12/2009   ASCUS cannot exclude HGSIL.   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 From [facility]     PAST SURGICAL  HISTORY: Past Surgical History:  Procedure Laterality Date   BREAST LUMPECTOMY Left 07/02/2019   BREAST LUMPECTOMY WITH RADIOACTIVE SEED AND SENTINEL LYMPH NODE BIOPSY Left 07/02/2019   Procedure: LEFT BREAST LUMPECTOMY WITH RADIOACTIVE SEED AND SENTINEL LYMPH NODE BIOPSY;  Surgeon: Jovita Kussmaul, MD;  Location: [facility];  Service: General;  Laterality: Left;   BUNIONECTOMY Right    COLPOSCOPY  12/2009   negative   EYE SURGERY     cataracts   FOOT SURGERY Right    bone removed    hardward removal from right foot  12/15   Dr. Wardell Honour   JOINT REPLACEMENT Right 2001   Hip replacement    Posterior vaginal repair  10/21/2012   Procedure: VAGINAL REPAIR POSTERIOR; Surgeon: Daneil Dolin, MD; Location: [facility] MAIN OR; Service: Gynecology;;   SINOSCOPY     TONSILLECTOMY     UTERINE SUSPENSION  10/21/12   Procedure: UTEROSACRAL SUSPENSION; Surgeon: Daneil Dolin, MD; Location[facility] MAIN OR; Service: Gynecology;;   VAGINAL HYSTERECTOMY  10/21/12   Procedure: HYSTERECTOMY VAGINAL; Surgeon: Daneil Dolin, MD; Location: [facility]; Service: Gynecology; Laterality: N/A; bilateral salpingectomy    FAMILY HISTORY: Family History  Problem Relation Age of Onset   Rheum arthritis Mother    Cancer Father    Osteoarthritis Sister    Osteoarthritis Brother    Crohn's disease Son    Allergic rhinitis Neg Hx    Asthma Neg Hx    Eczema Neg Hx    Urticaria Neg Hx   Patient's father was 9 years old when he died from cancer, the patient does not know what type. The patient has little information on her father's family. Patient's mother died at age 5. The patient denies a family hx of breast or ovarian cancer. She has 3 maternal half-siblings, 2 half-brothers and 1 half-sister.   GYNECOLOGIC HISTORY:  Patient's last menstrual period was 05/13/2005. Menarche: 65 years old Age at first live birth: 65 years old Gar[place] P 2 LMP age 12 Contraceptive: used  for 6 months HRT no, but used a trial of local cream  Hysterectomy? Yes, 10/2012 BSO? 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 She is married. Husband [name] is retired from working in Engineer, technical sales. At home is just the two of them. Daughter [name] works in administration in [place]. Son [name] works in Engineer, technical sales in [place], [name]. Artia has no grandchildren. She is not a Designer, fashion/clothing.    ADVANCED DIRECTIVES: in place   HEALTH MAINTENANCE: Social History   Tobacco Use   Smoking status: Never   Smokeless tobacco: Never  Vaping Use   Vaping Use: Never used  Substance Use Topics   Alcohol use: Yes    Alcohol/week: 4.0 standard drinks    Types: 4 Glasses of wine per week    Comment: glass of wine with dinner   Drug use: Never     Colonoscopy: 06/2014 (Dr. Carlton Adam), repeat in 10 years  PAP: 09/2016, negative  Bone density: yes, date unsure   Allergies  Allergen Reactions   Nitrofurantoin Other (See Comments)   Cephalexin Diarrhea  Ciprofloxacin Diarrhea   Sulfa Antibiotics Other (See Comments)    Stomach cramps and diarrhea    Current Outpatient Medications  Medication Sig Dispense Refill   tamoxifen (NOLVADEX) 20 MG tablet Take 1 tablet (20 mg total) by mouth daily. 90 tablet 12   aspirin-acetaminophen-caffeine (EXCEDRIN MIGRAINE) 250-250-65 MG tablet Take by mouth as needed.      azelastine (OPTIVAR) 0.05 % ophthalmic solution Place 1 drop into both eyes 2 (two) times daily. 6 mL 5   Carbinoxamine Maleate 4 MG TABS TAKE 1 TABLET BY MOUTH EVERY 8 HOURS AS NEEDED 30 tablet 0   diazepam (VALIUM) 10 MG tablet 10 mg at bedtime. Uses vaginally for pain     ibuprofen (ADVIL,MOTRIN) 200 MG tablet Take 200 mg by mouth every 6 (six) hours as needed.     ipratropium (ATROVENT) 0.03 % nasal spray Place 2 sprays into both nostrils every 12 (twelve) hours.     omeprazole (PRILOSEC) 20 MG capsule TAKE 1 CAPSULE BY  MOUTH EVERY DAY (Patient taking differently: TAKE 1 CAPSULE BY MOUTH EVERY DAY, as needed) 30 capsule 1   solifenacin (VESICARE) 5 MG tablet Take 1 tablet (5 mg total) by mouth as needed. 90 tablet 4   No current facility-administered medications for this visit.    OBJECTIVE:  white woman who appears stated age  65:   01/16/21 1121  BP: (!) 112/43  Pulse: 73  Resp: 16  Temp: 97.7 F (36.5 C)  SpO2: 99%      Body mass index is 26.17 kg/m.   Wt Readings from Last 3 Encounters:  01/16/21 185 lb (83.9 kg)  07/26/20 189 lb (85.7 kg)  12/14/19 192 lb 4.8 oz (87.2 kg)      ECOG FS:1 - Symptomatic but completely ambulatory  Sclerae unicteric, EOMs intact Wearing a mask No cervical or supraclavicular adenopathy Lungs no rales or rhonchi Heart regular rate and rhythm Abd soft, nontender, positive bowel sounds MSK no focal spinal tenderness, no upper extremity lymphedema Neuro: nonfocal, well oriented, appropriate affect Breasts: The right breast is benign.  The left breast is status postlumpectomy and radiation.  There is no evidence of local recurrence.  Both axillae are benign.   LAB RESULTS:  CMP     Component Value Date/Time   NA 140 01/16/2021 1109   NA 141 12/19/2017 0855   K 3.4 (L) 01/16/2021 1109   CL 105 01/16/2021 1109   CO2 27 01/16/2021 1109   GLUCOSE 90 01/16/2021 1109   BUN 11 01/16/2021 1109   BUN 14 12/19/2017 0855   CREATININE 0.88 01/16/2021 1109   CREATININE 0.85 07/06/2015 1108   CALCIUM 9.0 01/16/2021 1109   PROT 6.4 (L) 01/16/2021 1109   PROT 6.4 12/19/2017 0855   ALBUMIN 3.8 01/16/2021 1109   AST 14 (L) 01/16/2021 1109   ALT 12 01/16/2021 1109   ALKPHOS 64 01/16/2021 1109   BILITOT 0.4 01/16/2021 1109   GFRNONAA >60 01/16/2021 1109   GFRAA >60 04/28/2019 0842    No results found for: TOTALPROTELP, ALBUMINELP, A1GS, A2GS, BETS, BETA2SER, GAMS, MSPIKE, SPEI  No results found for: KPAFRELGTCHN, LAMBDASER, KAPLAMBRATIO  Lab Results   Component Value Date   WBC 6.4 01/16/2021   NEUTROABS 4.2 01/16/2021   HGB 13.4 01/16/2021   HCT 39.4 01/16/2021   MCV 88.5 01/16/2021   PLT 247 01/16/2021    No results found for: LABCA2  No components found for: BCWUGQ916  No results for input(s): INR in the last 168  hours.  No results found for: LABCA2  No results found for: IRJ188  No results found for: CZY606  No results found for: TKZ601  No results found for: CA2729  No components found for: HGQUANT  No results found for: CEA1 / No results found for: CEA1   No results found for: AFPTUMOR  No results found for: CHROMOGRNA  No results found for: KPAFRELGTCHN, LAMBDASER, KAPLAMBRATIO (kappa/lambda light chains)  No results found for: HGBA, HGBA2QUANT, HGBFQUANT, HGBSQUAN (Hemoglobinopathy evaluation)   No results found for: LDH  No results found for: IRON, TIBC, IRONPCTSAT (Iron and TIBC)  No results found for: FERRITIN  Urinalysis    Component Value Date/Time   BILIRUBINUR N 01/27/2018 1555   PROTEINUR Negative 01/27/2018 1555   UROBILINOGEN 0.2 01/27/2018 1555   NITRITE POSITIVE 01/27/2018 1555   LEUKOCYTESUR Negative 01/27/2018 1555    STUDIES: No results found.    ELIGIBLE FOR AVAILABLE RESEARCH PROTOCOL: [place]  ASSESSMENT: 65 y.o. [place], [place] woman status post left breast upper inner quadrant biopsy 04/22/2019 for a clinical T1a N0, stage IA invasive ductal carcinoma, grade 2, estrogen and progesterone receptor positive, HER-2 not amplified, with an MIB-1 of 5%.  (1) status post left lumpectomy and sentinel lymph node sampling 07/02/2019 for a pT1c pN0, stage IA invasive ductal carcinoma, grade 2, with negative margins  (a) a total of 3 sentinel lymph nodes were removed  (2) Oncotype score of 10 predicts a risk of recurrence outside the breast in the next 9 years of 3% if the patient's only systemic therapy is antiestrogens for 5 years.  It also predicts no benefit from  chemotherapy.  (3) adjuvant radiation 08/24/2019 through 09/20/2019  Site Technique Total Dose (Gy) Dose per Fx (Gy) Completed Fx Beam Energies  Breast, Left: Breast_Lt_Bst 3D 10/10 2 5/5 6X  Breast, Left: Breast_Lt 3D 40.05/40.05 2.67 15/15 6X, 10X   (3) tamoxifen started June 2021   PLAN: Jahaira is now a year and a half out from definitive surgery for her breast cancer with no evidence of disease recurrence.  This is favorable.  She is tolerating tamoxifen well and I do not think any of the symptoms she is complaining about are related to that medication.  The plan is to continue it for a minimum of 5 years.  Her constipation pattern is unusual and seems refractory to various treatments.  This has been thoroughly evaluated before.  I suggest that she go back to her GI doctor but she wants to seek a second opinion elsewhere and I will be arranging for that in the near future.  Otherwise the arthritis pain she is having might benefit from the water aerobics she is doing and also from tai chi.  She is willing to give that a try.  She is already scheduled for mammography later this month and her next visit here will be after her mammography next year  Total encounter time 35 minutes.   [name], MD   01/16/2021 6:05 PM Medical Oncology and Hematology [facility] [place], [place] [address] Tel. [phone]    Fax. [phone]   This document serves as a record of services personally performed by Lurline Del, MD. It was created on his behalf by Wilburn Mylar, a trained medical scribe. The creation of this record is based on the scribe's personal observations and the provider's statements to them.   Lindie Spruce MD, have reviewed the above documentation for accuracy and completeness, and  I agree with the above.   *Total Encounter Time as defined by the Centers for Medicare and Medicaid Services includes, in addition to the face-to-face  time of a patient visit (documented in the note above) non-face-to-face time: obtaining and reviewing outside history, ordering and reviewing medications, tests or procedures, care coordination (communications with other health care professionals or caregivers) and documentation in the medical record.

## 2021-01-16 ENCOUNTER — Other Ambulatory Visit: Payer: Self-pay

## 2021-01-16 ENCOUNTER — Inpatient Hospital Stay: Payer: Medicare PPO | Attending: Oncology

## 2021-01-16 ENCOUNTER — Inpatient Hospital Stay (HOSPITAL_BASED_OUTPATIENT_CLINIC_OR_DEPARTMENT_OTHER): Payer: Medicare PPO | Admitting: Oncology

## 2021-01-16 VITALS — BP 112/43 | HR 73 | Temp 97.7°F | Resp 16 | Wt 185.0 lb

## 2021-01-16 DIAGNOSIS — Z79899 Other long term (current) drug therapy: Secondary | ICD-10-CM | POA: Diagnosis not present

## 2021-01-16 DIAGNOSIS — Z170 Estrogen receptor positive status [ER+]: Secondary | ICD-10-CM | POA: Diagnosis not present

## 2021-01-16 DIAGNOSIS — Z923 Personal history of irradiation: Secondary | ICD-10-CM | POA: Insufficient documentation

## 2021-01-16 DIAGNOSIS — C50212 Malignant neoplasm of upper-inner quadrant of left female breast: Secondary | ICD-10-CM

## 2021-01-16 DIAGNOSIS — F952 Tourette's disorder: Secondary | ICD-10-CM | POA: Insufficient documentation

## 2021-01-16 DIAGNOSIS — Z79810 Long term (current) use of selective estrogen receptor modulators (SERMs): Secondary | ICD-10-CM | POA: Diagnosis not present

## 2021-01-16 LAB — CMP (CANCER CENTER ONLY)
ALT: 12 U/L (ref 0–44)
AST: 14 U/L — ABNORMAL LOW (ref 15–41)
Albumin: 3.8 g/dL (ref 3.5–5.0)
Alkaline Phosphatase: 64 U/L (ref 38–126)
Anion gap: 8 (ref 5–15)
BUN: 11 mg/dL (ref 8–23)
CO2: 27 mmol/L (ref 22–32)
Calcium: 9 mg/dL (ref 8.9–10.3)
Chloride: 105 mmol/L (ref 98–111)
Creatinine: 0.88 mg/dL (ref 0.44–1.00)
GFR, Estimated: >60 mL/min (ref >60)
Glucose, Bld: 90 mg/dL (ref 70–99)
Potassium: 3.4 mmol/L — ABNORMAL LOW (ref 3.5–5.1)
Sodium: 140 mmol/L (ref 135–145)
Total Bilirubin: 0.4 mg/dL (ref 0.3–1.2)
Total Protein: 6.4 g/dL — ABNORMAL LOW (ref 6.5–8.1)

## 2021-01-16 LAB — CBC WITH DIFFERENTIAL (CANCER CENTER ONLY)
Abs Immature Granulocytes: 0.01 10*3/uL (ref 0.00–0.07)
Basophils Absolute: 0 10*3/uL (ref 0.0–0.1)
Basophils Relative: 1 %
Eosinophils Absolute: 0.2 10*3/uL (ref 0.0–0.5)
Eosinophils Relative: 3 %
HCT: 39.4 % (ref 36.0–46.0)
Hemoglobin: 13.4 g/dL (ref 12.0–15.0)
Immature Granulocytes: 0 %
Lymphocytes Relative: 25 %
Lymphs Abs: 1.6 10*3/uL (ref 0.7–4.0)
MCH: 30.1 pg (ref 26.0–34.0)
MCHC: 34 g/dL (ref 30.0–36.0)
MCV: 88.5 fL (ref 80.0–100.0)
Monocytes Absolute: 0.4 10*3/uL (ref 0.1–1.0)
Monocytes Relative: 6 %
Neutro Abs: 4.2 10*3/uL (ref 1.7–7.7)
Neutrophils Relative %: 65 %
Platelet Count: 247 10*3/uL (ref 150–400)
RBC: 4.45 MIL/uL (ref 3.87–5.11)
RDW: 12.8 % (ref 11.5–15.5)
WBC Count: 6.4 10*3/uL (ref 4.0–10.5)
nRBC: 0 % (ref 0.0–0.2)

## 2021-01-16 MED ORDER — TAMOXIFEN CITRATE 20 MG PO TABS: 20.0000 mg | ORAL_TABLET | Freq: Every day | ORAL | 12 refills | Status: AC

## 2021-01-30 ENCOUNTER — Other Ambulatory Visit: Payer: Self-pay

## 2021-01-30 ENCOUNTER — Ambulatory Visit
Admission: RE | Admit: 2021-01-30 | Discharge: 2021-01-30 | Disposition: A | Discharge status: Home / Self Care | Payer: Medicare PPO | Source: Ambulatory Visit | Attending: Oncology | Admitting: Oncology

## 2021-01-30 DIAGNOSIS — Z9889 Other specified postprocedural states: Secondary | ICD-10-CM

## 2021-01-30 DIAGNOSIS — Z853 Personal history of malignant neoplasm of breast: Secondary | ICD-10-CM

## 2021-02-04 IMAGING — MG MM BREAST BX W LOC DEV 1ST LESION IMAGE BX SPEC STEREO GUIDE*L*
7 of 16 series · 7 of 28 positions shown · non-contrast
Comparison: Previous exams.
COMPARISON: Previous exams.
COMPARISON: Previous exams.

Addendum:
CLINICAL DATA: Patient presents for biopsy of a left breast mass.

EXAM:
LEFT BREAST STEREOTACTIC CORE NEEDLE BIOPSY

[L (1 of 7)]
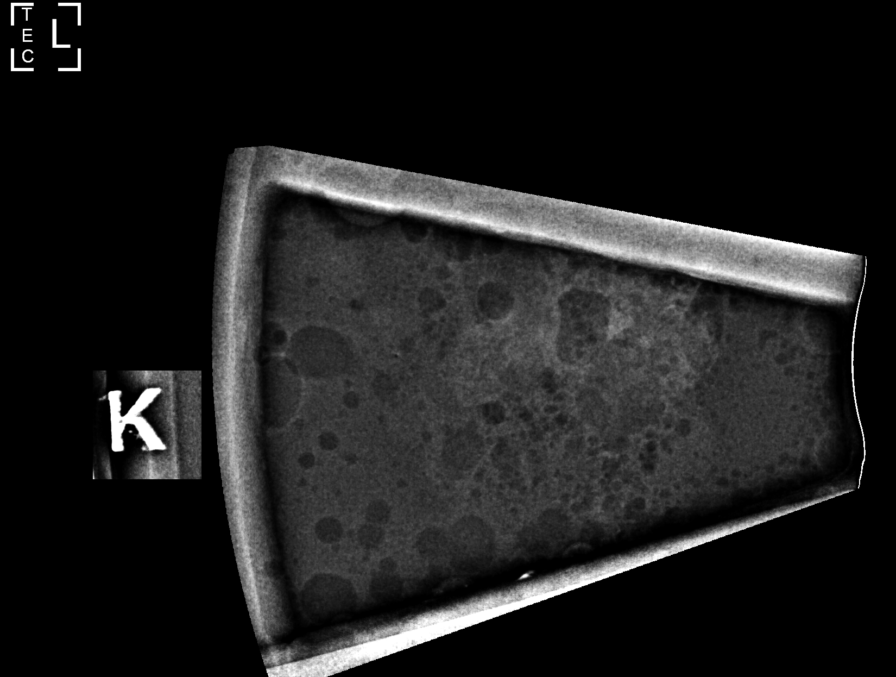

[L (2 of 7)]
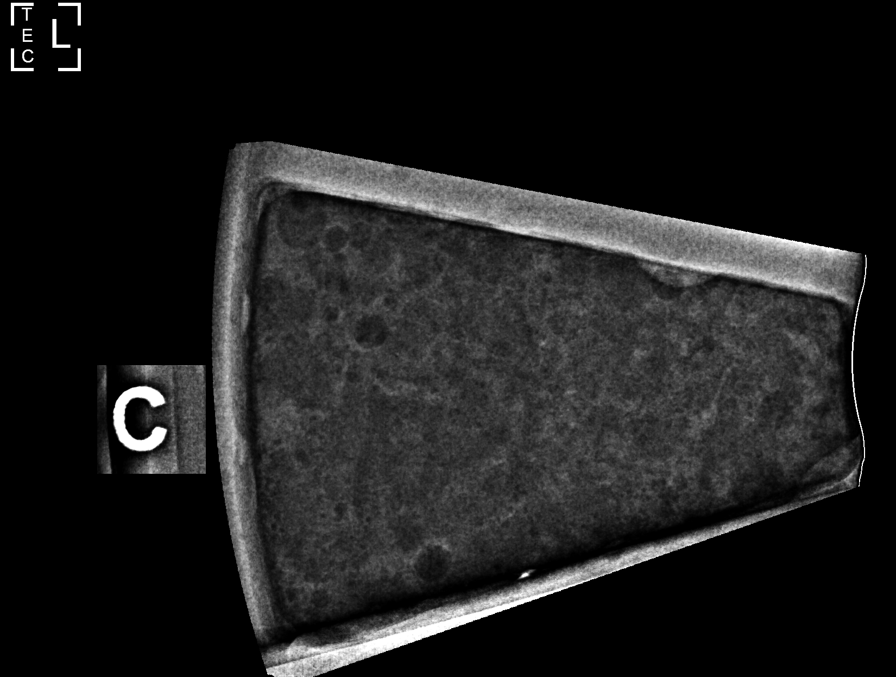

[L (3 of 7)]
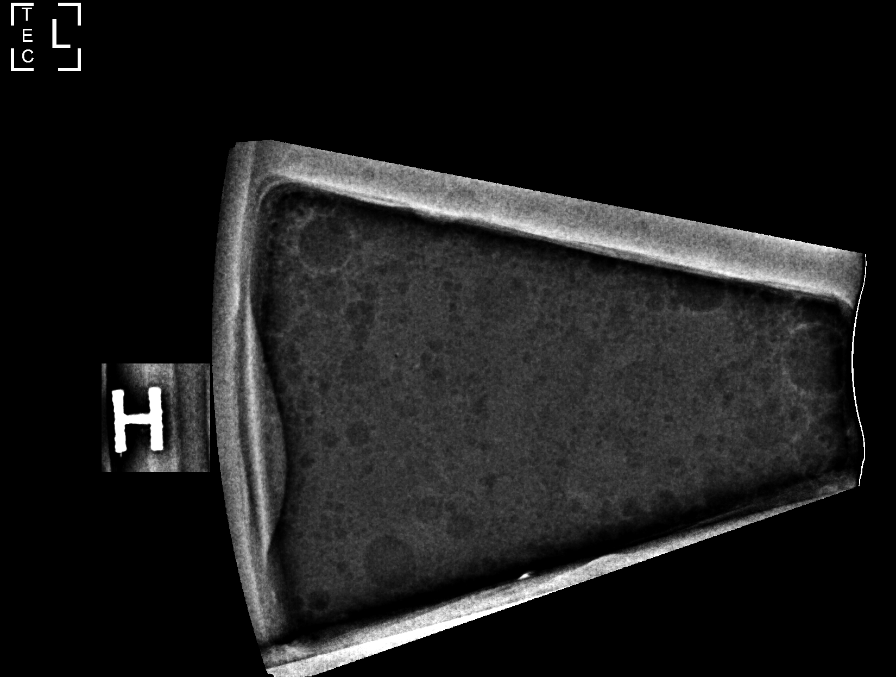

[L (4 of 7)]
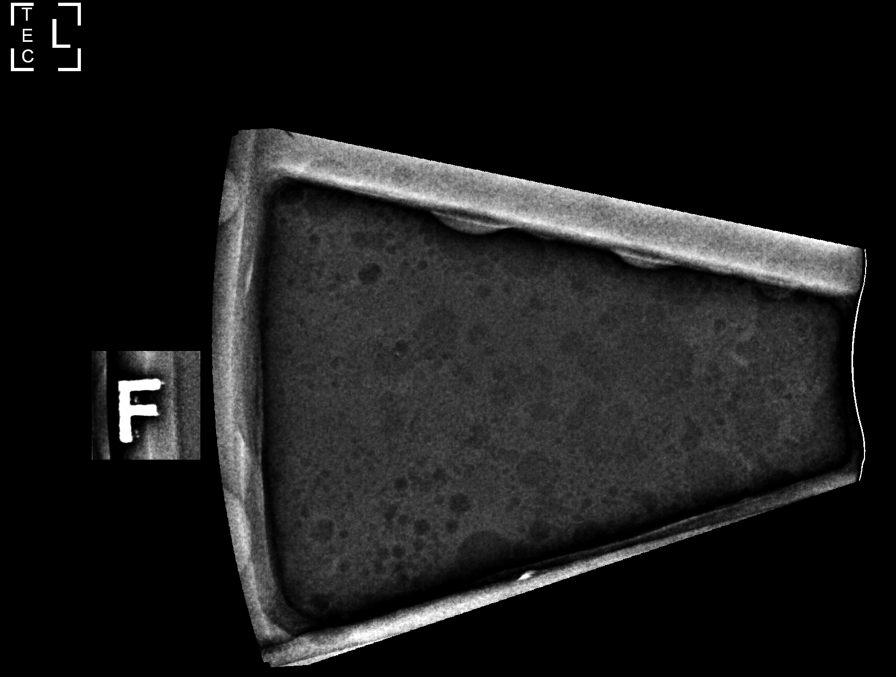

[L (5 of 7)]
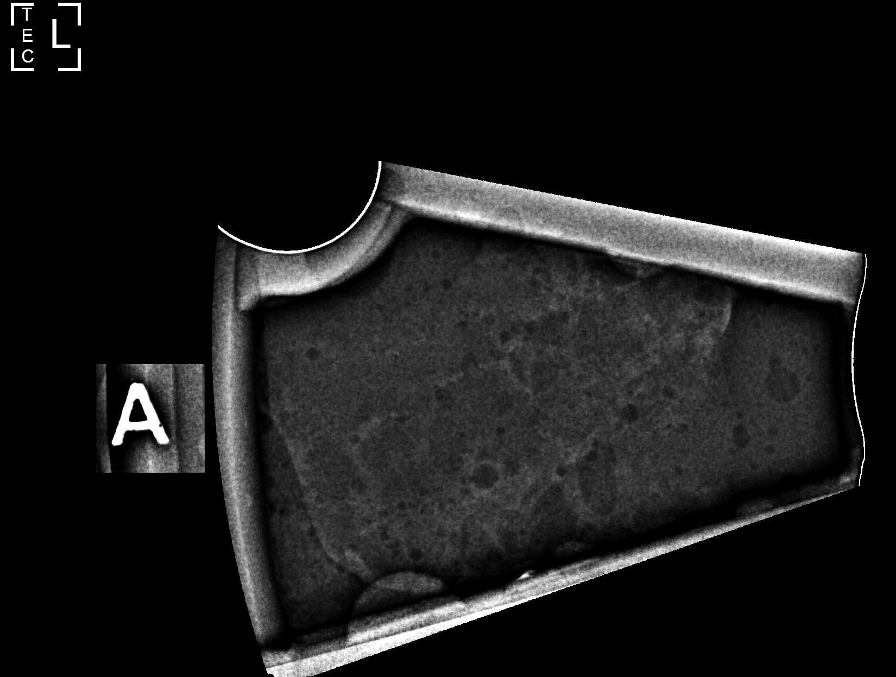

[L (6 of 7)]
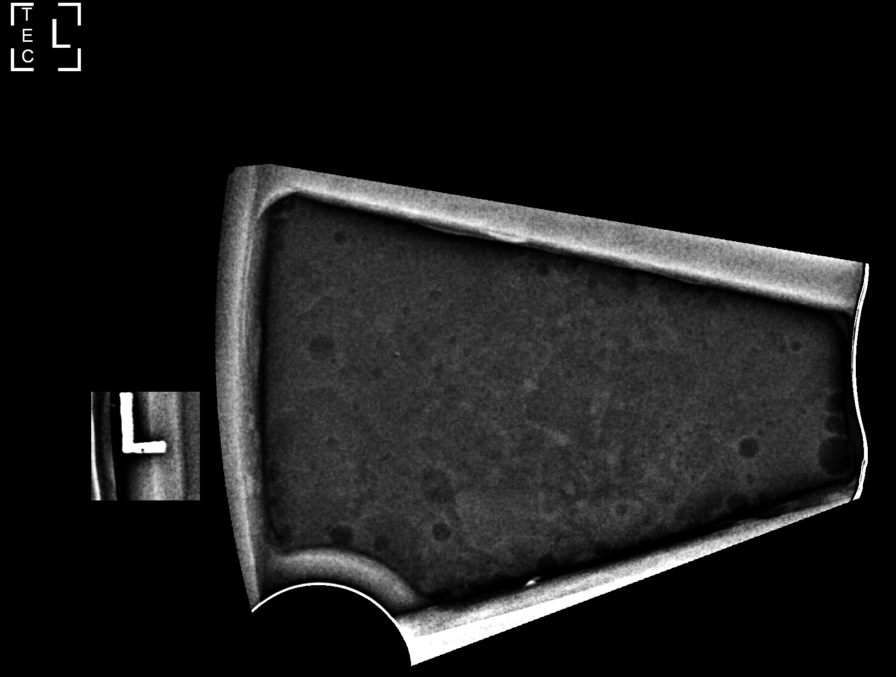

[L (7 of 7)]
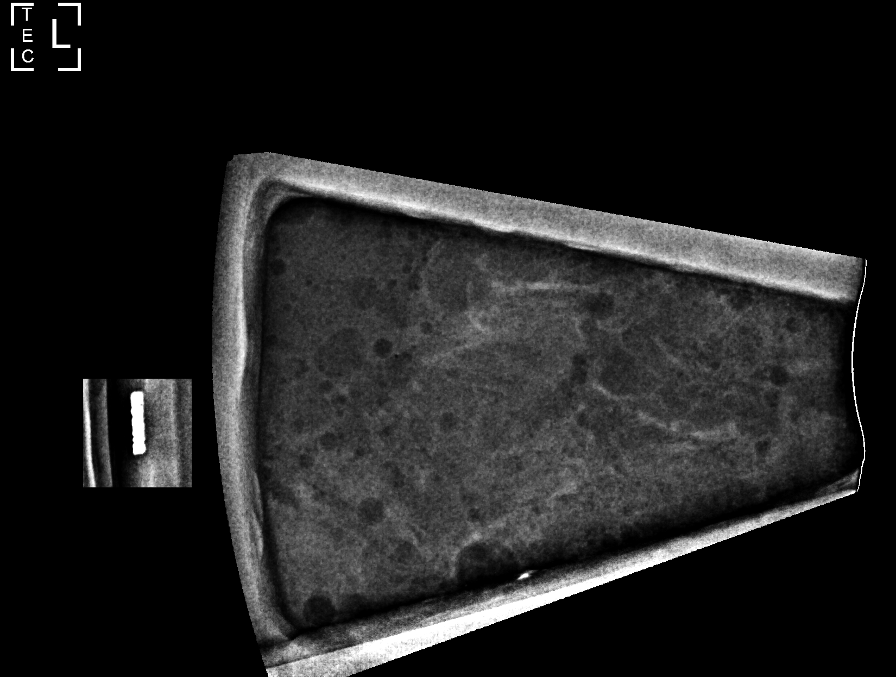

[7 of 28 positions shown; findings below may reference images not displayed]



Using sterile technique and 1% Lidocaine as local anesthetic, under
stereotactic guidance, a 9 gauge vacuum assisted device was used to
perform core needle biopsy of a mass in the upper inner quadrant of
the left breast using a superior approach.

Lesion quadrant: Upper inner quadrant

At the conclusion of the procedure, a coil tissue marker clip was
deployed into the biopsy cavity. Follow-up 2-view mammogram was
performed and dictated separately.
IMPRESSION: Stereotactic-guided biopsy of a left breast mass at 11 o'clock.
There was a small post biopsy hematoma.

ADDENDUM:
Pathology revealed GRADE II INVASIVE DUCTAL CARCINOMA, DUCTAL
CARCINOMA IN SITU of the LEFT breast, 11 o'clock. This was found to
be concordant by Dr. Detion Almendingen.

Pathology results were discussed with the patient by telephone. The
patient reported doing well after the biopsy with tenderness at the
site. Post biopsy instructions and care were reviewed and questions
were answered. The patient was encouraged to call The [REDACTED]

Dr. Crentsil Caiquo of [HOSPITAL] [REDACTED]care spoke with patient
regarding pathology results and surgical referral. At the request of
the patient, her follow up care after biopsy will proceed at [facility]
[REDACTED]. [facility] [REDACTED] was contacted by Reitel Uhl RN on
April 23, 2019 to facilitate process.

Pathology results reported by Dy-Ve Dute, RN on 04/23/2019.

ADDENDUM:
Upon reconsideration , the patient decided she would like to be
referred to [REDACTED] [REDACTED] at
[REDACTED] on April 28, 2019.

Dy-Ve Dute, RN on 04/23/2019.

*** End of Addendum ***
Addendum:
FINDINGS: The patient and I discussed the procedure of stereotactic-guided
biopsy including benefits and alternatives. We discussed the high
likelihood of a successful procedure. We discussed the risks of the
procedure including infection, bleeding, tissue injury, clip
migration, and inadequate sampling. Informed written consent was
given. The usual time out protocol was performed immediately prior
to the procedure.

Using sterile technique and 1% Lidocaine as local anesthetic, under
stereotactic guidance, a 9 gauge vacuum assisted device was used to
perform core needle biopsy of a mass in the upper inner quadrant of
the left breast using a superior approach.

Lesion quadrant: Upper inner quadrant

At the conclusion of the procedure, a coil tissue marker clip was
deployed into the biopsy cavity. Follow-up 2-view mammogram was
performed and dictated separately.
IMPRESSION: Stereotactic-guided biopsy of a left breast mass at 11 o'clock.
There was a small post biopsy hematoma.

ADDENDUM:
Pathology revealed GRADE II INVASIVE DUCTAL CARCINOMA, DUCTAL
CARCINOMA IN SITU of the LEFT breast, 11 o'clock. This was found to
be concordant by Dr. Detion Almendingen.

Pathology results were discussed with the patient by telephone. The
patient reported doing well after the biopsy with tenderness at the
site. Post biopsy instructions and care were reviewed and questions
were answered. The patient was encouraged to call The [REDACTED]

Dr. Crentsil Caiquo of [HOSPITAL] [REDACTED]care spoke with patient
regarding pathology results and surgical referral. At the request of
the patient, her follow up care after biopsy will proceed at [facility]
[REDACTED]. [facility] [REDACTED] was contacted by Reitel Uhl RN on
April 23, 2019 to facilitate process.

Pathology results reported by Dy-Ve Dute, RN on 04/23/2019.



Using sterile technique and 1% Lidocaine as local anesthetic, under
stereotactic guidance, a 9 gauge vacuum assisted device was used to
perform core needle biopsy of a mass in the upper inner quadrant of
the left breast using a superior approach.

Lesion quadrant: Upper inner quadrant

At the conclusion of the procedure, a coil tissue marker clip was
deployed into the biopsy cavity. Follow-up 2-view mammogram was
performed and dictated separately.
IMPRESSION: Stereotactic-guided biopsy of a left breast mass at 11 o'clock.
There was a small post biopsy hematoma.

## 2021-09-21 ENCOUNTER — Telehealth: Payer: Self-pay | Admitting: *Deleted

## 2021-09-21 NOTE — Telephone Encounter (Signed)
Received a VM from patient stating she has stopped her tamoxifen due to some side effects she has been having.  Returned patient's call but had to leave VM for her to return my call. ?

## 2021-09-25 ENCOUNTER — Other Ambulatory Visit: Payer: Self-pay

## 2021-09-25 DIAGNOSIS — C50212 Malignant neoplasm of upper-inner quadrant of left female breast: Secondary | ICD-10-CM

## 2021-09-25 NOTE — Progress Notes (Signed)
Pt called and LVM stating she had to stop Tamoxifen d/t severe joint pain. Pt asks for advice about alternatives. Called pt and LVM to offer appt with MD to discuss further.  ? ?Orders entered for pt to have MM in September, per MD.  ?

## 2021-10-04 ENCOUNTER — Telehealth: Payer: Self-pay | Admitting: *Deleted

## 2021-10-04 NOTE — Telephone Encounter (Signed)
This RN returned call to pt per her VM stating she is still waiting on a return call ( she states she has left prior message ).  This RN informed her of noted prior calls with nurses returning her call and leaving VM's - she states she has not received any VM's.  This RN apologized for above and unsure if issue is more of a technical issue.  Per this call - Lauren Osborne states she has stopped the tamoxifen now since around 08/30/2021 due to noted side effects especially bone pain interfering with her ADLs.  Since stopping the medication symptoms have subsided. She is wondering if the dose can be adjusted or is there other medications with better tolerance.  Per phone discussion - appt made for evaluation by MD to discuss her issues and treatment options.  Pt stated appreciation of call. No further needs at this time.

## 2021-10-11 ENCOUNTER — Other Ambulatory Visit: Payer: Self-pay

## 2021-10-11 ENCOUNTER — Inpatient Hospital Stay: Payer: Medicare PPO

## 2021-10-11 ENCOUNTER — Inpatient Hospital Stay: Payer: Medicare PPO | Attending: Hematology and Oncology | Admitting: Hematology and Oncology

## 2021-10-11 ENCOUNTER — Encounter: Payer: Self-pay | Admitting: Hematology and Oncology

## 2021-10-11 ENCOUNTER — Telehealth: Payer: Self-pay | Admitting: Hematology and Oncology

## 2021-10-11 DIAGNOSIS — R55 Syncope and collapse: Secondary | ICD-10-CM | POA: Insufficient documentation

## 2021-10-11 DIAGNOSIS — Z170 Estrogen receptor positive status [ER+]: Secondary | ICD-10-CM | POA: Diagnosis not present

## 2021-10-11 DIAGNOSIS — R42 Dizziness and giddiness: Secondary | ICD-10-CM | POA: Insufficient documentation

## 2021-10-11 DIAGNOSIS — C50212 Malignant neoplasm of upper-inner quadrant of left female breast: Secondary | ICD-10-CM | POA: Insufficient documentation

## 2021-10-11 DIAGNOSIS — Z79899 Other long term (current) drug therapy: Secondary | ICD-10-CM | POA: Diagnosis not present

## 2021-10-11 DIAGNOSIS — Z923 Personal history of irradiation: Secondary | ICD-10-CM | POA: Insufficient documentation

## 2021-10-11 LAB — CBC WITH DIFFERENTIAL/PLATELET
Abs Immature Granulocytes: 0.02 10*3/uL (ref 0.00–0.07)
Basophils Absolute: 0.1 10*3/uL (ref 0.0–0.1)
Basophils Relative: 1 %
Eosinophils Absolute: 0.2 10*3/uL (ref 0.0–0.5)
Eosinophils Relative: 3 %
HCT: 42.1 % (ref 36.0–46.0)
Hemoglobin: 14.1 g/dL (ref 12.0–15.0)
Immature Granulocytes: 0 %
Lymphocytes Relative: 25 %
Lymphs Abs: 1.7 10*3/uL (ref 0.7–4.0)
MCH: 29.8 pg (ref 26.0–34.0)
MCHC: 33.5 g/dL (ref 30.0–36.0)
MCV: 89 fL (ref 80.0–100.0)
Monocytes Absolute: 0.7 10*3/uL (ref 0.1–1.0)
Monocytes Relative: 10 %
Neutro Abs: 4.4 10*3/uL (ref 1.7–7.7)
Neutrophils Relative %: 61 %
Platelets: 277 10*3/uL (ref 150–400)
RBC: 4.73 MIL/uL (ref 3.87–5.11)
RDW: 13.3 % (ref 11.5–15.5)
WBC: 7.1 10*3/uL (ref 4.0–10.5)
nRBC: 0 % (ref 0.0–0.2)

## 2021-10-11 LAB — COMPREHENSIVE METABOLIC PANEL
ALT: 10 U/L (ref 0–44)
AST: 14 U/L — ABNORMAL LOW (ref 15–41)
Albumin: 4.3 g/dL (ref 3.5–5.0)
Alkaline Phosphatase: 65 U/L (ref 38–126)
Anion gap: 5 (ref 5–15)
BUN: 14 mg/dL (ref 8–23)
CO2: 32 mmol/L (ref 22–32)
Calcium: 9.9 mg/dL (ref 8.9–10.3)
Chloride: 103 mmol/L (ref 98–111)
Creatinine, Ser: 0.99 mg/dL (ref 0.44–1.00)
GFR, Estimated: >60 mL/min (ref >60)
Glucose, Bld: 82 mg/dL (ref 70–99)
Potassium: 3.7 mmol/L (ref 3.5–5.1)
Sodium: 140 mmol/L (ref 135–145)
Total Bilirubin: 0.4 mg/dL (ref 0.3–1.2)
Total Protein: 7 g/dL (ref 6.5–8.1)

## 2021-10-11 NOTE — Progress Notes (Signed)
[place]  [phone] [phone]     ID: Frimy Uffelman DOB: 1956-05-12  MR#: [id number]  QMG#:[id number]  Patient Care Team: [name], MD as PCP - General (Family Medicine) Mauro Kaufmann, RN as Oncology Nurse Navigator Rockwell Germany, RN as Oncology Nurse Navigator Jovita Kussmaul, MD as Consulting Physician (General Surgery) Magrinat, Virgie Dad, MD (Inactive) as Consulting Physician (Oncology) [name], MD as Consulting Physician (Radiation Oncology) [name], MD as Consulting Physician (Gynecology) [name], MD as Consulting Physician (Urology) [name], MD as Consulting Physician (Ophthalmology) Melvenia Beam, MD as Consulting Physician (Neurology) Benay Pike, MD OTHER MD:  CHIEF COMPLAINT: Estrogen receptor positive breast cancer  CURRENT TREATMENT: Tamoxifen   INTERVAL HISTORY:  Keron returns today for follow up of her estrogen receptor positive breast cancer. She started tamoxifen on 10/20/2019.  She stopped tamoxifen since about 4 weeks ago. She says she is so tired, all her bones hurt and she couldn't stand it anymore. She continues to complain of ongoing episodes of dizziness and near syncope. She had mammogram in Sep 2022, no evidence of breast malignancy. She basically feels very poorly and doesn't want to go back to full dose tamoxifen. She was also hoping to be referred to a cardiologist for further evaluation.  She does not want to go see her PCP since they are at [facility].   COVID 19 VACCINATION STATUS: Status post Pfizer x4 as of August 2020   HISTORY OF CURRENT ILLNESS: From the original intake note:  Kaydance Bowie had routine screening mammography on 04/14/2019 showing a possible abnormality in the bilateral breasts. She underwent bilateral diagnostic mammography with tomography and left breast ultrasonography at [facility] on 04/20/2019 showing: breast  density category B; right breast with fibroglandular tissue without persistent mass or distortion; left breast with 0.5 cm irregular mass at 11 o'clock; no abnormal left axillary lymph nodes.  Accordingly on 04/22/2019 she proceeded to biopsy of the left breast area in question. The pathology from this procedure ([id number]) showed: invasive ductal carcinoma, grade 2; ductal carcinoma in situ. Prognostic indicators significant for: estrogen receptor, 95% positive with strong staining intensity and progesterone receptor, 50% positive with moderate staining intensity. Proliferation marker Ki67 at 5%. HER2 negative by immunohistochemistry (1+).  The patient's subsequent history is as detailed below.   PAST MEDICAL HISTORY: Past Medical History:  Diagnosis Date   Abnormal Pap smear of cervix 12/2009   ASCUS cannot exclude HGSIL.   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 From [facility]     PAST SURGICAL HISTORY: Past Surgical History:  Procedure Laterality Date   BREAST LUMPECTOMY Left 07/02/2019   BREAST LUMPECTOMY WITH RADIOACTIVE SEED AND SENTINEL LYMPH NODE BIOPSY Left 07/02/2019   Procedure: LEFT BREAST LUMPECTOMY WITH RADIOACTIVE SEED AND SENTINEL LYMPH NODE BIOPSY;  Surgeon: Jovita Kussmaul, MD;  Location: [place];  Service: General;  Laterality: Left;   BUNIONECTOMY Right  COLPOSCOPY  12/2009   negative   EYE SURGERY     cataracts   FOOT SURGERY Right    bone removed    hardward removal from right foot  12/15   Dr. Wardell Honour   JOINT REPLACEMENT  Right 2001   Hip replacement    Posterior vaginal repair  10/21/2012   Procedure: VAGINAL REPAIR POSTERIOR; Surgeon: Daneil Dolin, MD; Location[facility] MAIN OR; Service: Gynecology;;   SINOSCOPY     TONSILLECTOMY     UTERINE SUSPENSION  10/21/12   Procedure: UTEROSACRAL SUSPENSION; Surgeon: Daneil Dolin, MD; Location: [place]; Service: Gynecology;;   VAGINAL HYSTERECTOMY  10/21/12   Procedure: HYSTERECTOMY VAGINAL; Surgeon: Daneil Dolin, MD; Location: [place]; Service: Gynecology; Laterality: N/A; bilateral salpingectomy    FAMILY HISTORY: Family History  Problem Relation Age of Onset   Rheum arthritis Mother    Cancer Father    Osteoarthritis Sister    Osteoarthritis Brother    Crohn's disease Son    Allergic rhinitis Neg Hx    Asthma Neg Hx    Eczema Neg Hx    Urticaria Neg Hx   Patient's father was 31 years old when he died from cancer, the patient does not know what type. The patient has little information on her father's family. Patient's mother died at age 33. The patient denies a family hx of breast or ovarian cancer. She has 3 maternal half-siblings, 2 half-brothers and 1 half-sister.   GYNECOLOGIC HISTORY:  Patient's last menstrual period was 05/13/2005. Menarche: 66 years old Age at first live birth: 66 years old [place] P 2 LMP age 16 Contraceptive: used for 6 months HRT no, but used a trial of local cream  Hysterectomy? Yes, 10/2012 BSO? 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 She is married. Husband [name] is retired from working in Engineer, technical sales. At home is just the two of them. Daughter [name] works in administration in [place]. Son [name] works in Engineer, technical sales in [place], [name]. Olia has no grandchildren. She is not a Designer, fashion/clothing.    ADVANCED DIRECTIVES: in place   HEALTH MAINTENANCE: Social History   Tobacco Use   Smoking status: Never    Smokeless tobacco: Never  Vaping Use   Vaping Use: Never used  Substance Use Topics   Alcohol use: Yes    Alcohol/week: 4.0 standard drinks    Types: 4 Glasses of wine per week    Comment: glass of wine with dinner   Drug use: Never     Colonoscopy: 06/2014 (Dr. Carlton Adam), repeat in 10 years  PAP: 09/2016, negative  Bone density: yes, date unsure   Allergies  Allergen Reactions   Nitrofurantoin Other (See Comments)   Cephalexin Diarrhea   Ciprofloxacin Diarrhea   Sulfa Antibiotics Other (See Comments)    Stomach cramps and diarrhea    Current Outpatient Medications  Medication Sig Dispense Refill   aspirin-acetaminophen-caffeine (EXCEDRIN MIGRAINE) 250-250-65 MG tablet Take by mouth as needed.      azelastine (OPTIVAR) 0.05 % ophthalmic solution Place 1 drop into both eyes 2 (two) times daily. 6 mL 5   Carbinoxamine Maleate 4 MG TABS TAKE 1 TABLET BY MOUTH EVERY 8 HOURS AS NEEDED 30 tablet 0   diazepam (VALIUM) 10 MG tablet 10 mg at bedtime. Uses vaginally for pain     ibuprofen (ADVIL,MOTRIN) 200 MG tablet Take 200 mg by mouth every 6 (six)  hours as needed.     ipratropium (ATROVENT) 0.03 % nasal spray Place 2 sprays into both nostrils every 12 (twelve) hours.     omeprazole (PRILOSEC) 20 MG capsule TAKE 1 CAPSULE BY MOUTH EVERY DAY (Patient taking differently: TAKE 1 CAPSULE BY MOUTH EVERY DAY, as needed) 30 capsule 1   solifenacin (VESICARE) 5 MG tablet Take 1 tablet (5 mg total) by mouth as needed. 90 tablet 4   No current facility-administered medications for this visit.    OBJECTIVE:  white woman who appears stated age  There were no vitals filed for this visit.     There is no height or weight on file to calculate BMI.   Wt Readings from Last 3 Encounters:  01/16/21 185 lb (83.9 kg)  07/26/20 189 lb (85.7 kg)  12/14/19 192 lb 4.8 oz (87.2 kg)      ECOG FS:1 - Symptomatic but completely ambulatory  Physical Exam Constitutional:      Appearance: Normal  appearance.  Chest:     Comments: Bilateral breast examined.  Left breast normal to inspection or palpation no palpable masses.  No regional adenopathy.  No concern for recurrence at this time.  Right breast normal to inspection and palpation as well Musculoskeletal:     Cervical back: Normal range of motion and neck supple. No rigidity.  Lymphadenopathy:     Cervical: No cervical adenopathy.  Neurological:     Mental Status: She is alert.     LAB RESULTS:      Component Value Date/Time   NA 140 01/16/2021 1109   NA 141 12/19/2017 0855   K 3.4 (L) 01/16/2021 1109   CL 105 01/16/2021 1109   CO2 27 01/16/2021 1109   GLUCOSE 90 01/16/2021 1109   BUN 11 01/16/2021 1109   BUN 14 12/19/2017 0855   CREATININE 0.88 01/16/2021 1109   CREATININE 0.85 07/06/2015 1108   CALCIUM 9.0 01/16/2021 1109   PROT 6.4 (L) 01/16/2021 1109   PROT 6.4 12/19/2017 0855   ALBUMIN 3.8 01/16/2021 1109   AST 14 (L) 01/16/2021 1109   ALT 12 01/16/2021 1109   ALKPHOS 64 01/16/2021 1109   BILITOT 0.4 01/16/2021 1109   GFRNONAA >60 01/16/2021 1109   GFRAA >60 04/28/2019 0842    No results found for: TOTALPROTELP, ALBUMINELP, A1GS, A2GS, BETS, BETA2SER, GAMS, MSPIKE, SPEI  No results found for: KPAFRELGTCHN, LAMBDASER, KAPLAMBRATIO  Lab Results  Component Value Date   WBC 6.4 01/16/2021   NEUTROABS 4.2 01/16/2021   HGB 13.4 01/16/2021   HCT 39.4 01/16/2021   MCV 88.5 01/16/2021   PLT 247 01/16/2021    No results found for: LABCA2  No components found for: GLOVFI433  No results for input(s): INR in the last 168 hours.  No results found for: LABCA2  No results found for: IRJ188  No results found for: CZY606  No results found for: TKZ601  No results found for: CA2729  No components found for: HGQUANT  No results found for: CEA1 / No results found for: CEA1   No results found for: AFPTUMOR  No results found for: CHROMOGRNA  No results found for: KPAFRELGTCHN, LAMBDASER,  KAPLAMBRATIO (kappa/lambda light chains)  No results found for: HGBA, HGBA2QUANT, HGBFQUANT, HGBSQUAN (Hemoglobinopathy evaluation)   No results found for: LDH  No results found for: IRON, TIBC, IRONPCTSAT (Iron and TIBC)  No results found for: FERRITIN  Urinalysis    Component Value Date/Time   BILIRUBINUR N 01/27/2018 1555   PROTEINUR Negative  01/27/2018 1555   UROBILINOGEN 0.2 01/27/2018 1555   NITRITE POSITIVE 01/27/2018 1555   LEUKOCYTESUR Negative 01/27/2018 1555    STUDIES: No results found.    ELIGIBLE FOR AVAILABLE RESEARCH PROTOCOL: [place]  ASSESSMENT: 66 y.o. [place], [place] woman status post left breast upper inner quadrant biopsy 04/22/2019 for a clinical T1a N0, stage IA invasive ductal carcinoma, grade 2, estrogen and progesterone receptor positive, HER-2 not amplified, with an MIB-1 of 5%.  (1) status post left lumpectomy and sentinel lymph node sampling 07/02/2019 for a pT1c pN0, stage IA invasive ductal carcinoma, grade 2, with negative margins  (a) a total of 3 sentinel lymph nodes were removed  (2) Oncotype score of 10 predicts a risk of recurrence outside the breast in the next 9 years of 3% if the patient's only systemic therapy is antiestrogens for 5 years.  It also predicts no benefit from chemotherapy.  (3) adjuvant radiation 08/24/2019 through 09/20/2019  Site Technique Total Dose (Gy) Dose per Fx (Gy) Completed Fx Beam Energies  Breast, Left: Breast_Lt_Bst 3D 10/10 2 5/5 6X  Breast, Left: Breast_Lt 3D 40.05/40.05 2.67 15/15 6X, 10X   (3) tamoxifen started June 2021   PLAN:   Ms. Sanyiah is not tolerating tamoxifen well.  She complains of diffuse bone aches, fatigue, episodes of dizziness which has been limiting her activities of daily living.  She stopped taking tamoxifen about a month ago but has not noticed any significant improvement in symptoms. Given ongoing episodes of dizziness and profound fatigue, recommended PCP and cardiology  evaluation.  She has requested referral to our cardio oncology, center referral. With regards to breast cancer, there is no concern for recurrence.  She is due for mammogram September 2023. She has inquired about considering low-dose of tamoxifen.  We have discussed that there is no large randomized data with low-dose of tamoxifen but some patients tolerate low-dose of tamoxifen better. She is willing to try 10 mg of tamoxifen after waiting for another couple weeks to feel better.  I have strongly encouraged her to go back and see her PCP for further evaluation of the symptoms which are likely unrelated to her antiestrogen therapy.  She expressed understanding of all the recommendations.  She will return to clinic in 6 months or sooner as needed.  Benay Pike, MD   10/11/2021 12:02 PM Medical Oncology and Hematology [facility] [place], [place] [address] Tel. [phone]    Fax. [phone]  Total time spent: 30 minutes  *Total Encounter Time as defined by the Centers for Medicare and Medicaid Services includes, in addition to the face-to-face time of a patient visit (documented in the note above) non-face-to-face time: obtaining and reviewing outside history, ordering and reviewing medications, tests or procedures, care coordination (communications with other health care professionals or caregivers) and documentation in the medical record.

## 2021-10-19 ENCOUNTER — Encounter (HOSPITAL_BASED_OUTPATIENT_CLINIC_OR_DEPARTMENT_OTHER): Payer: Self-pay | Admitting: Cardiology

## 2021-10-19 ENCOUNTER — Ambulatory Visit (HOSPITAL_BASED_OUTPATIENT_CLINIC_OR_DEPARTMENT_OTHER): Payer: Medicare PPO | Admitting: Cardiology

## 2021-10-19 VITALS — BP 107/74 | HR 81 | Ht 70.0 in | Wt 183.9 lb

## 2021-10-19 DIAGNOSIS — R5382 Chronic fatigue, unspecified: Secondary | ICD-10-CM

## 2021-10-19 DIAGNOSIS — Z8669 Personal history of other diseases of the nervous system and sense organs: Secondary | ICD-10-CM

## 2021-10-19 DIAGNOSIS — Z7189 Other specified counseling: Secondary | ICD-10-CM

## 2021-10-19 DIAGNOSIS — R55 Syncope and collapse: Secondary | ICD-10-CM | POA: Diagnosis not present

## 2021-10-19 DIAGNOSIS — R42 Dizziness and giddiness: Secondary | ICD-10-CM

## 2021-10-19 DIAGNOSIS — Z853 Personal history of malignant neoplasm of breast: Secondary | ICD-10-CM | POA: Diagnosis not present

## 2021-10-19 NOTE — Patient Instructions (Signed)
Medication Instructions:  Continue current medications  *If you need a refill on your cardiac medications before your next appointment, please call your pharmacy*   Lab Work: TSH  If you have labs (blood work) drawn today and your tests are completely normal, you will receive your results only by: [place] (if you have MyChart) OR A paper copy in the mail If you have any lab test that is abnormal or we need to change your treatment, we will call you to review the results.   Testing/Procedures: Your physician has requested that you have an echocardiogram. Echocardiography is a painless test that uses sound waves to create images of your heart. It provides your doctor with information about the size and shape of your heart and how well your heart's chambers and valves are working. This procedure takes approximately one hour. There are no restrictions for this procedure.    Follow-Up: At [facility], you and your health needs are our priority.  As part of our continuing mission to provide you with exceptional heart care, we have created designated Provider Care Teams.  These Care Teams include your primary Cardiologist (physician) and Advanced Practice Providers (APPs -  Physician Assistants and Nurse Practitioners) who all work together to provide you with the care you need, when you need it.  We recommend signing up for the patient portal called "MyChart".  Sign up information is provided on this After Visit Summary.  MyChart is used to connect with patients for Virtual Visits (Telemedicine).  Patients are able to view lab/test results, encounter notes, upcoming appointments, etc.  Non-urgent messages can be sent to your provider as well.   To learn more about what you can do with MyChart, go to [email or web address].    Your next appointment:   3 month(s)  The format for your next appointment:   In Person  Provider:   Buford Dresser, MD    Other  Instructions   Important Information About Sugar

## 2021-10-19 NOTE — Progress Notes (Signed)
Cardiology Office Note:    Date:  10/19/2021   ID:  Lauren Osborne, DOB [birth year], MRN [id number]  PCP:  No primary care provider on file.  Cardiologist:  Buford Dresser, MD  Referring MD: Benay Pike, MD   CC: new patient evaluation for dizziness and severe fatigue  History of Present Illness:    Lauren Osborne is a 66 y.o. female with a hx of left breast cancer and osteoarthritis who is seen as a new consult at the request of Iruku, Arletha Pili, MD for the evaluation and management of dizziness and severe fatigue.  Note from Dr. Chryl Heck dated 10/11/21 reviewed. Referred to cardiology for further evaluation of dizziness and fatigue   Cardiovascular risk factors: Prior clinical ASCVD: none Comorbid conditions: Denies hypertension, hyperlipidemia, diabetes, chronic kidney disease. History of breast cancer (surgery, radiation, and tamoxifen) Chronic inflammatory conditions: none Family history: Her sister has both Parkinson's and vertigo. Her mother had rheumatoid arthritis. Her father has a metastasizing lymph cancer. Prior cardiac testing and/or incidental findings on other testing (ie coronary calcium): remote testing of unclear type, no records Exercise level: Very low, she is not comfortable standing for long. Current diet: Lots of soups  Today:  She has many small episodes, but a few large ones of dizziness, nausea, and loss of vision. She was playing pickle-ball 2 months ago, when her vision completely blacked out. Her heart was pounding. She felt as though the world is moving around her, and had a terrible headache.  After several minutes, she began to have vision again. She used to get migraines twice a week, but now she feels she is getting these episodes instead. She feels that fluorescent lights may make the episodes worse, but it might be that she is more often on her feet when she is in a store. Of note, she recently was on a trip to [place], where she would walk  up to 6 blocks at a time without any episodes.  She experiences general fatigue. She has been experiencing constipation for years.  Her blood pressure is often [id number] systolic and 60 diastolic. Previously her blood pressure has been 120/70, and has been lower recently.  She has undergone tamoxifen, radiation, and surgery for her cancer.  She sits most of the day. She is not comfortable standing for long. She would like to get back into exercise, specifically swimming.  She drinks water constantly as she feels dehydrated easily. She eats lots of vegetable soups and miso soup, and considers her diet normal.  She has a blind eye, which sometimes causes double vision.  She denies any chest pain, shortness of breath, or peripheral edema. No syncope, orthopnea, or PND.  Past Medical History:  Diagnosis Date   Abnormal Pap smear of cervix 12/2009   ASCUS cannot exclude HGSIL.   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 From [facility]     Past Surgical History:  Procedure Laterality Date   BREAST LUMPECTOMY Left 07/02/2019   BREAST LUMPECTOMY  WITH RADIOACTIVE SEED AND SENTINEL LYMPH NODE BIOPSY Left 07/02/2019   Procedure: LEFT BREAST LUMPECTOMY WITH RADIOACTIVE SEED AND SENTINEL LYMPH NODE BIOPSY;  Surgeon: Jovita Kussmaul, MD;  Location: [place];  Service: General;  Laterality: Left;   BUNIONECTOMY Right    COLPOSCOPY  12/2009   negative   EYE SURGERY     cataracts   FOOT SURGERY Right    bone removed    hardward removal from right foot  12/15   Dr.  Wardell Honour   JOINT REPLACEMENT Right 2001   Hip replacement    Posterior vaginal repair  10/21/2012   Procedure: VAGINAL REPAIR POSTERIOR; Surgeon: Daneil Dolin, MD; Location: [place]; Service: Gynecology;;   SINOSCOPY     TONSILLECTOMY     UTERINE SUSPENSION  10/21/12   Procedure: UTEROSACRAL SUSPENSION; Surgeon: Daneil Dolin, MD; Location: [facility] MAIN OR; Service: Gynecology;;   VAGINAL HYSTERECTOMY  10/21/12   Procedure: HYSTERECTOMY VAGINAL; Surgeon: Daneil Dolin, MD; Location: [place]; Service: Gynecology; Laterality: N/A; bilateral salpingectomy    Current Medications: Current Outpatient Medications on File Prior to Visit  Medication Sig   aspirin-acetaminophen-caffeine (EXCEDRIN MIGRAINE) 250-250-65 MG tablet Take by mouth as needed.    azelastine (OPTIVAR) 0.05 % ophthalmic solution Place 1 drop into both eyes 2 (two) times daily.   Carbinoxamine Maleate 4 MG TABS TAKE 1 TABLET BY MOUTH EVERY 8 HOURS AS NEEDED   diazepam (VALIUM) 10 MG tablet 10 mg at bedtime. Uses vaginally for pain   ibuprofen (ADVIL,MOTRIN) 200 MG tablet Take 200 mg by mouth every 6 (six) hours as needed.   ipratropium (ATROVENT) 0.03 % nasal spray Place 2 sprays into both nostrils every 12 (twelve) hours.   lactulose (CHRONULAC) 10 GM/15ML solution SMARTSIG:15 Milliliter(s) By Mouth 3 Times Daily   lactulose (CHRONULAC) 10 GM/15ML solution TAKE 15 MLS BY MOUTH 2 TIMES DAILY.   omeprazole (PRILOSEC) 20 MG capsule TAKE 1 CAPSULE BY MOUTH EVERY DAY   solifenacin (VESICARE) 5 MG tablet Take 1 tablet (5 mg total) by mouth as needed.   tamoxifen (NOLVADEX) 20 MG tablet Take 1 tablet by mouth daily.   No current facility-administered medications on file prior to visit.     Allergies:   Nitrofurantoin, Cephalexin, Ciprofloxacin, and Sulfa antibiotics   Social History   Tobacco Use   Smoking status: Never   Smokeless tobacco: Never  Vaping Use   Vaping Use: Never  used  Substance Use Topics   Alcohol use: Yes    Alcohol/week: 4.0 standard drinks of alcohol    Types: 4 Glasses of wine per week    Comment: glass of wine with dinner   Drug use: Never    Family History: family history includes Cancer in her father; Crohn's disease in her son; Osteoarthritis in her brother and sister; Rheum arthritis in her mother. There is no history of Allergic rhinitis, Asthma, Eczema, or Urticaria.  ROS:   Please see the history of present illness.  Additional pertinent ROS: Constitutional: Negative for chills, fever, night sweats, unintentional weight loss  HENT: Negative for ear pain and hearing loss.   Eyes: Negative and eye pain. Complete loss of vision. Respiratory: Negative for cough, sputum, wheezing.   Cardiovascular: See HPI. Gastrointestinal: Negative for abdominal pain, melena, and hematochezia. Positive for nausea and constipation.  Genitourinary: Negative for dysuria and hematuria.  Musculoskeletal: Negative for falls and myalgias.  Skin: Negative for itching and rash.  Neurological: Negative for focal weakness,  focal sensory changes and loss of consciousness. Positive for dizziness, fatigue, and headache. Endo/Heme/Allergies: Does not bruise/bleed easily.     EKGs/Labs/Other Studies Reviewed:    The following studies were reviewed today:  LE Doppler Study 11/21/17:  Final Interpretation:  Right: Resting right ankle-brachial index is within normal range. No  evidence of significant right lower extremity arterial disease.   Left: Resting left ankle-brachial index is within normal range. No  evidence of significant left lower extremity arterial disease.   EKG:  EKG is personally reviewed.   10/19/21: NSR at 81 bpm, iRBBB, nonspecific ST pattern. Unchanged from 2019  Recent Labs: 10/11/2021: ALT 10; BUN 14; Creatinine, Ser 0.99; Hemoglobin 14.1; Platelets 277; Potassium 3.7; Sodium 140   Recent Lipid Panel    Component Value Date/Time    CHOL 200 07/06/2015 1108   TRIG 91 07/06/2015 1108   HDL 59 07/06/2015 1108   CHOLHDL 3.4 07/06/2015 1108   VLDL 18 07/06/2015 1108   LDLCALC 123 07/06/2015 1108    Physical Exam:    VS:  BP 107/74   Pulse 81   Ht '5\' 10"'$  (1.778 m)   Wt 183 lb 14.4 oz (83.4 kg)   LMP 05/13/2005   SpO2 98%   BMI 26.39 kg/m     Orthostatic VS for the past 24 hrs (Last 3 readings):  BP- Lying Pulse- Lying BP- Sitting Pulse- Sitting BP- Standing at 0 minutes Pulse- Standing at 0 minutes BP- Standing at 3 minutes Pulse- Standing at 3 minutes  10/19/21 1434 141/82 88 115/77 88 104/73 102 107/73 108     Wt Readings from Last 3 Encounters:  10/19/21 183 lb 14.4 oz (83.4 kg)  10/11/21 182 lb 12.8 oz (82.9 kg)  01/16/21 185 lb (83.9 kg)    GEN: Well nourished, well developed in no acute distress HEENT: Normal, moist mucous membranes NECK: No JVD CARDIAC: regular rhythm, normal S1 and S2, no rubs or gallops. No murmur. VASCULAR: Radial and DP pulses 2+ bilaterally. No carotid bruits RESPIRATORY:  Clear to auscultation without rales, wheezing or rhonchi  ABDOMEN: Soft, non-tender, non-distended MUSCULOSKELETAL:  Ambulates independently SKIN: Warm and dry, no edema NEUROLOGIC:  Alert and oriented x 3. No focal neuro deficits noted. PSYCHIATRIC:  Normal affect    ASSESSMENT:    1. Postural dizziness with presyncope   2. Chronic fatigue   3. History of breast cancer   4. Vertigo   5. H/O migraine   6. Cardiac risk counseling    PLAN:    Postural dizziness Presyncope Fatigue -sensation of movement sounds more consistent with vertigo. She does have a history of migraines, but denies any recently -has a history of breast cancer, but did not receive systemic chemotherapy (was on tamoxifen) -sitting BP was her baseline, lying BP higher than her typical. Has mild orthostasis. -will check echo to rule out structural heart issue -ECG unremarkable -with fatigue, orthostatics as above, will check  TSH  We discussed the following recommendations: -avoid dehydration. Often it requires high volumes of fluids, often with salt/electrolytes included, to stay hydrated. People with orthostasis are very sensitive to fluid shifts and dehydration. Oral rehydration is preferred, and routine use of IV fluids is not recommended. -if tolerated, compression stocking can assist with fluid management and prevent pooling in the legs. -slow position changes are recommended -if there is a feeling of severe lightheadedness, like near to passing out, recommend lying on the floor on the back, with legs elevated up on a chair or  up against the wall. -the best long term management is gradual exercise conditioning. I recommend seated exercises such as bike to start, to avoid the risk of falling with lightheadedness. Exercise programs, either through supervised programs like cardiac rehab or through personal programs, should focus on gradually increasing exercise tolerance and conditioning.    Cardiac risk counseling and prevention recommendations: -recommend heart healthy/Mediterranean diet, with whole grains, fruits, vegetable, fish, lean meats, nuts, and olive oil. Limit salt. -recommend moderate walking, 3-5 times/week for 30-50 minutes each session. Aim for at least 150 minutes.week. Goal should be pace of 3 miles/hours, or walking 1.5 miles in 30 minutes -recommend avoidance of tobacco products. Avoid excess alcohol. -ASCVD risk score: The ASCVD Risk score (Arnett DK, et al., 2019) failed to calculate for the following reasons:   Cannot find a previous HDL lab   Cannot find a previous total cholesterol lab    Plan for follow up: 3 months or sooner as needed.  Buford Dresser, MD, PhD, [facility]    Medication Adjustments/Labs and Tests Ordered: Current medicines are reviewed at length with the patient today.  Concerns regarding medicines are outlined above.   Orders Placed  This Encounter  Procedures   TSH   EKG 12-Lead   ECHOCARDIOGRAM COMPLETE   No orders of the defined types were placed in this encounter.  Patient Instructions  Medication Instructions:  Continue current medications  *If you need a refill on your cardiac medications before your next appointment, please call your pharmacy*   Lab Work: TSH  If you have labs (blood work) drawn today and your tests are completely normal, you will receive your results only by: [place] (if you have MyChart) OR A paper copy in the mail If you have any lab test that is abnormal or we need to change your treatment, we will call you to review the results.   Testing/Procedures: Your physician has requested that you have an echocardiogram. Echocardiography is a painless test that uses sound waves to create images of your heart. It provides your doctor with information about the size and shape of your heart and how well your heart's chambers and valves are working. This procedure takes approximately one hour. There are no restrictions for this procedure.    Follow-Up: At [facility], you and your health needs are our priority.  As part of our continuing mission to provide you with exceptional heart care, we have created designated Provider Care Teams.  These Care Teams include your primary Cardiologist (physician) and Advanced Practice Providers (APPs -  Physician Assistants and Nurse Practitioners) who all work together to provide you with the care you need, when you need it.  We recommend signing up for the patient portal called "MyChart".  Sign up information is provided on this After Visit Summary.  MyChart is used to connect with patients for Virtual Visits (Telemedicine).  Patients are able to view lab/test results, encounter notes, upcoming appointments, etc.  Non-urgent messages can be sent to your provider as well.   To learn more about what you can do with MyChart, go to  [email or web address].    Your next appointment:   3 month(s)  The format for your next appointment:   In Person  Provider:   Buford Dresser, MD    Other Instructions   Important Information About Sugar          I,Mathew Stumpf,acting as a scribe for Buford Dresser, MD.,have documented all relevant documentation on  the behalf of Buford Dresser, MD,as directed by  Buford Dresser, MD while in the presence of Buford Dresser, MD.  I, Buford Dresser, MD, have reviewed all documentation for this visit. The documentation on 10/19/21 for the exam, diagnosis, procedures, and orders are all accurate and complete.   Signed, Buford Dresser, MD PhD 10/19/2021 6:11 PM    [facility]

## 2021-10-20 LAB — TSH: TSH: 1.62 u[IU]/mL (ref 0.450–4.500)

## 2021-10-26 ENCOUNTER — Telehealth: Payer: Self-pay

## 2021-10-26 MED ORDER — TAMOXIFEN CITRATE 10 MG PO TABS: 10.0000 mg | ORAL_TABLET | Freq: Two times a day (BID) | ORAL | 3 refills | Status: DC

## 2021-10-26 NOTE — Telephone Encounter (Signed)
Pt called to ask about decreased dose of Tamoxifen per Dr Rob Hickman last visit. Pt states she is still experiencing joint pain even without taking Tamoxifen; sx of dizziness have ceased for 2 weeks. Per Dr Chryl Heck, pt will take Tamoxifen '10mg'$ . Order entered to pt's preferred phx. Pt knows to call with any concerns or worsening sx.

## 2021-10-30 ENCOUNTER — Telehealth (HOSPITAL_BASED_OUTPATIENT_CLINIC_OR_DEPARTMENT_OTHER): Payer: Self-pay | Admitting: Cardiology

## 2021-10-30 DIAGNOSIS — R42 Dizziness and giddiness: Secondary | ICD-10-CM

## 2021-10-30 NOTE — Telephone Encounter (Signed)
1) Spoke with patient and she stated she has a squeezing feeling in both of her legs after sitting for a while, would like to know if additional studies could be done. Legs feel "heavy"   2) Would like to get compression stockings after she gets additional testing, will need Rx at that time   Will forward Dr Harrell Gave for review

## 2021-10-30 NOTE — Telephone Encounter (Signed)
New Message:     Patient says she have some questions about the compression stockings they had discussed at her last office visit. She also wanted to know if there was a test for the legs to determine her need for the compression stockings.?

## 2021-10-31 NOTE — Telephone Encounter (Signed)
There is not a specific test to determine need for compression stockings--pulses were good on exam, so it's not an artery blood flow issues. Venous studies are more designed to look at big veins, clots, etc; less useful for small veins that often are the main issue. Compression stockings are typically not prescription--you can buy them at pharmacies or online. To get mild compression stockings, these are sold at sporting goods/department stores and online as well. 15-30 mmHg are medium compression (feel more tight than the mild), and there is typically not a number on the mild compression stockings. Hope that helps.

## 2021-10-31 NOTE — Telephone Encounter (Signed)
-  Pt updated and stated she need a prescription in order to use flex spending card. -Per Dr. Harrell Gave, ok to write prescription.   Prescription placed up front for pick up.

## 2021-11-01 ENCOUNTER — Ambulatory Visit (INDEPENDENT_AMBULATORY_CARE_PROVIDER_SITE_OTHER): Payer: Medicare PPO

## 2021-11-01 DIAGNOSIS — R5382 Chronic fatigue, unspecified: Secondary | ICD-10-CM

## 2021-11-01 DIAGNOSIS — R42 Dizziness and giddiness: Secondary | ICD-10-CM

## 2021-11-01 DIAGNOSIS — R55 Syncope and collapse: Secondary | ICD-10-CM

## 2021-11-01 LAB — ECHOCARDIOGRAM COMPLETE
Area-P 1/2: 3.91 cm2
S' Lateral: 2.67 cm
Single Plane A4C EF: 48.5 %

## 2021-11-08 ENCOUNTER — Encounter (HOSPITAL_BASED_OUTPATIENT_CLINIC_OR_DEPARTMENT_OTHER): Payer: Self-pay | Admitting: Obstetrics & Gynecology

## 2021-11-08 ENCOUNTER — Ambulatory Visit (INDEPENDENT_AMBULATORY_CARE_PROVIDER_SITE_OTHER): Payer: Medicare PPO | Admitting: Obstetrics & Gynecology

## 2021-11-08 VITALS — BP 115/62 | HR 85 | Ht 71.0 in | Wt 183.2 lb

## 2021-11-08 DIAGNOSIS — Z170 Estrogen receptor positive status [ER+]: Secondary | ICD-10-CM

## 2021-11-08 DIAGNOSIS — C50212 Malignant neoplasm of upper-inner quadrant of left female breast: Secondary | ICD-10-CM | POA: Diagnosis not present

## 2021-11-08 DIAGNOSIS — Z9189 Other specified personal risk factors, not elsewhere classified: Secondary | ICD-10-CM

## 2021-11-08 DIAGNOSIS — Z780 Asymptomatic menopausal state: Secondary | ICD-10-CM

## 2021-11-08 DIAGNOSIS — R002 Palpitations: Secondary | ICD-10-CM

## 2021-11-08 DIAGNOSIS — R3915 Urgency of urination: Secondary | ICD-10-CM | POA: Diagnosis not present

## 2021-11-08 MED ORDER — GEMTESA 75 MG PO TABS: 75.0000 mg | ORAL_TABLET | Freq: Every day | ORAL | 1 refills | Status: DC

## 2021-11-08 NOTE — Progress Notes (Signed)
66 y.o. G26P2002 Married White or Caucasian female here for breast and pelvic exam.  I am also following her for history of breast cancer.  Having some issues with joint pain.  On Tamoxifen.  Dosage has been decreased by Dr. Chryl Heck.  Not sure lowering the dosage has helped.    Reports she's had a couple of dizzy spells.  Has seen cardiology, Dr. Harrell Gave.  Using compression stocks.  Has a sensation that there is band around her legs.  Doesn't feel this as much when she wears the compression stockings.  Denies vaginal bleeding.    Has sensation of bladder pressure frequently.  Has urinary pressure.  When she voids, sensation is still present.  If she strains with voiding, this will help.  This has been present since her hysterectomy and rectocele repair.  Has seen urology and urogynecology at [facility].  Did some pelvic PT for a short while.  This was mostly for constipation.  Does not drink much coffee.  Does drink hot tea once daily.  Has tried Myrbetriq.  Didn't feel this did anything.  I wrote rx for vesicare in 2019.  She never took it.    Patient's last menstrual period was 05/13/2005.          Sexually active: Yes.    H/O STD:  no  Health Maintenance: PCP:  has seen provider at [facility] are up to date:  discussed pneumonia vaccinations Colonoscopy:  06/29/2014, follow up 10 years MMG:  01/30/2021 Negative Last pap smear:  09/12/2016 Negative.     reports that she has never smoked. She has never used smokeless tobacco. She reports current alcohol use of about 4.0 standard drinks of alcohol per week. She reports that she does not use drugs.  Past Medical History:  Diagnosis Date   Abnormal Pap smear of cervix 12/2009   ASCUS cannot exclude HGSIL.   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 From [facility]     Past Surgical History:  Procedure Laterality Date   BREAST LUMPECTOMY Left 07/02/2019   BREAST LUMPECTOMY WITH RADIOACTIVE SEED AND SENTINEL LYMPH NODE BIOPSY Left 07/02/2019   Procedure: LEFT BREAST LUMPECTOMY WITH RADIOACTIVE SEED AND SENTINEL LYMPH NODE BIOPSY;  Surgeon: Jovita Kussmaul, MD;  Location: [place];  Service: General;  Laterality: Left;   BUNIONECTOMY Right    COLPOSCOPY  12/2009   negative   EYE SURGERY     cataracts   FOOT SURGERY Right    bone removed    hardward removal from right foot  04/2014   Dr. Wardell Honour   JOINT REPLACEMENT Right 2001   Hip replacement    Posterior vaginal repair  10/21/2012   Procedure: VAGINAL REPAIR POSTERIOR; Surgeon: Daneil Dolin, MD; Location: [facility] MAIN OR; Service: Gynecology;;   SINOSCOPY     TONSILLECTOMY     TOTAL HIP ARTHROPLASTY Left 01/2021   at [facility] [facility]  10/21/2012   Procedure: UTEROSACRAL SUSPENSION;  Surgeon: Daneil Dolin, MD; Location: [facility] MAIN OR; Service: Gynecology;;   VAGINAL HYSTERECTOMY  10/21/2012   Procedure: HYSTERECTOMY VAGINAL; Surgeon: Daneil Dolin, MD; Location: [place]; Service: Gynecology; Laterality: N/A; bilateral salpingectomy    Current Outpatient Medications  Medication Sig Dispense Refill   aspirin-acetaminophen-caffeine (EXCEDRIN MIGRAINE) 250-250-65 MG tablet Take by mouth as needed.      azelastine (OPTIVAR) 0.05 % ophthalmic solution Place 1 drop into both eyes 2 (two) times daily. 6 mL 5   Carbinoxamine Maleate 4 MG TABS TAKE 1 TABLET BY MOUTH EVERY 8 HOURS AS NEEDED 30 tablet 0   diazepam (VALIUM) 10 MG tablet 10 mg at bedtime. Uses vaginally for pain      ibuprofen (ADVIL,MOTRIN) 200 MG tablet Take 200 mg by mouth every 6 (six) hours as needed.     ipratropium (ATROVENT) 0.03 % nasal spray Place 2 sprays into both nostrils every 12 (twelve) hours.     lactulose (CHRONULAC) 10 GM/15ML solution SMARTSIG:15 Milliliter(s) By Mouth 3 Times Daily     lactulose (CHRONULAC) 10 GM/15ML solution TAKE 15 MLS BY MOUTH 2 TIMES DAILY.     omeprazole (PRILOSEC) 20 MG capsule TAKE 1 CAPSULE BY MOUTH EVERY DAY 30 capsule 1   tamoxifen (NOLVADEX) 10 MG tablet Take 1 tablet (10 mg total) by mouth 2 (two) times daily. 90 tablet 3   No current facility-administered medications for this visit.    Family History  Problem Relation Age of Onset   Rheum arthritis Mother    Cancer Father    Osteoarthritis Sister    Osteoarthritis Brother    Crohn's disease Son    Allergic rhinitis Neg Hx    Asthma Neg Hx    Eczema Neg Hx    Urticaria Neg Hx     Review of Systems  All other systems reviewed and are negative.   Exam:   BP 115/62 (BP Location: Right Arm, Patient Position: Sitting, Cuff Size: Large)   Pulse 85   Ht '5\' 11"'$  (1.803 m) Comment: Reported  Wt 183 lb 3.2 oz (83.1 kg)   LMP 05/13/2005   BMI 25.55 kg/m   Height: '5\' 11"'$  (180.3 cm) (Reported)  General appearance: alert, cooperative and appears stated age Breasts: normal appearance, no masses or tenderness Abdomen: soft, non-tender; bowel sounds normal; no masses,  no organomegaly Lymph nodes: Cervical, supraclavicular, and axillary nodes normal.  No abnormal inguinal nodes palpated Neurologic: Grossly normal  Pelvic: External genitalia:  no lesions              Urethra:  normal appearing urethra with no masses, tenderness or lesions              Bartholins and Skenes: normal                 Vagina: normal appearing vagina with atrophic changes and no discharge, no lesions              Cervix: absent              Pap taken: No. Bimanual Exam:  Uterus:  uterus absent              Adnexa: no  mass, fullness, tenderness               Rectovaginal: Confirms               Anus:  normal sphincter tone, no lesions  Chaperone, Octaviano Batty, CMA, was present for exam.  Assessment/Plan: 1. GYN exam for high-risk Medicare patient - Pap smear not indicated.  H/O hysterectomy. - MMG 01/30/2021 - colonoscopy 06/29/2014, follow up 10 years - PCP:  has seen provider at [facility] - discussed pneumonia vaccinations  2. Palpitations - pt would like lipid panel done.  Future order placed.  She will return next week for this. - Lipid panel; Future  3. Urinary urgency - rx for Gemtasa to pharmacy.  If expensive, pt will let me know. - Vibegron (GEMTESA) 75 MG TABS; Take 75 mg by mouth daily.  Dispense: 30 tablet; Refill: 1  4. Malignant neoplasm of upper-inner quadrant of left breast in female, estrogen receptor positive ([place]) - followed by Dr. Chryl Heck and Dr. Marlou Starks  5. Postmenopausal - no HRT

## 2021-11-12 ENCOUNTER — Other Ambulatory Visit: Payer: Self-pay | Admitting: Obstetrics & Gynecology

## 2021-11-13 LAB — LIPID PANEL
Chol/HDL Ratio: 3.3 ratio (ref 0.0–4.4)
Cholesterol, Total: 229 mg/dL — ABNORMAL HIGH (ref 100–199)
HDL: 69 mg/dL (ref >39)
LDL Chol Calc (NIH): 144 mg/dL — ABNORMAL HIGH (ref 0–99)
Triglycerides: 91 mg/dL (ref 0–149)
VLDL Cholesterol Cal: 16 mg/dL (ref 5–40)

## 2021-12-02 ENCOUNTER — Other Ambulatory Visit (HOSPITAL_BASED_OUTPATIENT_CLINIC_OR_DEPARTMENT_OTHER): Payer: Self-pay | Admitting: Obstetrics & Gynecology

## 2021-12-02 DIAGNOSIS — R3915 Urgency of urination: Secondary | ICD-10-CM

## 2021-12-03 NOTE — Telephone Encounter (Signed)
Pt is not taking the medication. She states that she had an episode of rosacea after the first day of taking the medication. She reports and intolerance and is asking for alternative. Advised that I would send concerns to provider.

## 2021-12-03 NOTE — Telephone Encounter (Signed)
LMOVM for pt to call regarding refill request 

## 2021-12-12 ENCOUNTER — Telehealth (HOSPITAL_BASED_OUTPATIENT_CLINIC_OR_DEPARTMENT_OTHER): Payer: Self-pay | Admitting: *Deleted

## 2021-12-12 ENCOUNTER — Other Ambulatory Visit (HOSPITAL_BASED_OUTPATIENT_CLINIC_OR_DEPARTMENT_OTHER): Payer: Self-pay | Admitting: Obstetrics & Gynecology

## 2021-12-12 DIAGNOSIS — R3915 Urgency of urination: Secondary | ICD-10-CM

## 2021-12-12 MED ORDER — OXYBUTYNIN CHLORIDE ER 5 MG PO TB24: 5.0000 mg | ORAL_TABLET | Freq: Every day | ORAL | 1 refills | Status: DC

## 2021-12-12 NOTE — Telephone Encounter (Signed)
Informed pt that prescription for oxybutnin has been sent to her pharmacy. Pt denies having narrow angle glaucoma. Advised to stop use and notify us if she has dry eyes, dry mouth or constipation. Pt verbalized understanding.

## 2022-01-03 ENCOUNTER — Other Ambulatory Visit (HOSPITAL_BASED_OUTPATIENT_CLINIC_OR_DEPARTMENT_OTHER): Payer: Self-pay | Admitting: Obstetrics & Gynecology

## 2022-01-03 DIAGNOSIS — R3915 Urgency of urination: Secondary | ICD-10-CM

## 2022-01-17 ENCOUNTER — Telehealth (HOSPITAL_BASED_OUTPATIENT_CLINIC_OR_DEPARTMENT_OTHER): Payer: Self-pay

## 2022-01-17 ENCOUNTER — Other Ambulatory Visit (HOSPITAL_BASED_OUTPATIENT_CLINIC_OR_DEPARTMENT_OTHER): Payer: Self-pay | Admitting: Obstetrics & Gynecology

## 2022-01-17 DIAGNOSIS — R3915 Urgency of urination: Secondary | ICD-10-CM

## 2022-01-17 NOTE — Telephone Encounter (Signed)
Patient called to let us know that the second medication that was given to her for her urinary problems is not working. She is interested I nthe PT for UTI and would like to be referred. She would like to stay in [place] if at all possible. Please advise. tbw

## 2022-01-18 ENCOUNTER — Other Ambulatory Visit (HOSPITAL_BASED_OUTPATIENT_CLINIC_OR_DEPARTMENT_OTHER): Payer: Self-pay | Admitting: Obstetrics & Gynecology

## 2022-01-18 DIAGNOSIS — R3915 Urgency of urination: Secondary | ICD-10-CM

## 2022-01-18 DIAGNOSIS — N3281 Overactive bladder: Secondary | ICD-10-CM

## 2022-01-18 NOTE — Progress Notes (Signed)
Referral to PT placed per pt request

## 2022-01-18 NOTE — Telephone Encounter (Signed)
Called patient to let her know that referral has been placed for PT at [place] at [facility]. Patient expresses understanding. tbw

## 2022-01-21 ENCOUNTER — Ambulatory Visit (HOSPITAL_BASED_OUTPATIENT_CLINIC_OR_DEPARTMENT_OTHER): Payer: Medicare PPO | Admitting: Cardiology

## 2022-01-21 ENCOUNTER — Encounter (HOSPITAL_BASED_OUTPATIENT_CLINIC_OR_DEPARTMENT_OTHER): Payer: Self-pay | Admitting: Cardiology

## 2022-01-21 VITALS — BP 110/76 | HR 90 | Ht 71.0 in | Wt 185.7 lb

## 2022-01-21 DIAGNOSIS — R5382 Chronic fatigue, unspecified: Secondary | ICD-10-CM | POA: Diagnosis not present

## 2022-01-21 DIAGNOSIS — Z853 Personal history of malignant neoplasm of breast: Secondary | ICD-10-CM | POA: Diagnosis not present

## 2022-01-21 DIAGNOSIS — R55 Syncope and collapse: Secondary | ICD-10-CM

## 2022-01-21 DIAGNOSIS — Z7189 Other specified counseling: Secondary | ICD-10-CM

## 2022-01-21 DIAGNOSIS — R42 Dizziness and giddiness: Secondary | ICD-10-CM | POA: Diagnosis not present

## 2022-01-21 MED ORDER — MEDICAL COMPRESSION SOCKS MISC: 5 refills | Status: AC

## 2022-01-21 NOTE — Progress Notes (Signed)
Cardiology Office Note:    Date:  01/21/2022   ID:  Lauren Osborne, DOB 1955/06/20, MRN [id number]  PCP:  Benay Pike, MD  Cardiologist:  Buford Dresser, MD  CC: follow up  History of Present Illness:    Lauren Osborne is a 66 y.o. female with a hx of left breast cancer and osteoarthritis, previously seen for the evaluation and management of dizziness and severe fatigue, here today for follow up.   Cardiovascular risk factors: Prior clinical ASCVD: none Comorbid conditions: Denies hypertension, hyperlipidemia, diabetes, chronic kidney disease. History of breast cancer (surgery, radiation, and tamoxifen) Chronic inflammatory conditions: none Family history: Her sister has both Parkinson's and vertigo. Her mother had rheumatoid arthritis. Her father has a metastasizing lymph cancer. Prior cardiac testing and/or incidental findings on other testing (ie coronary calcium): remote testing of unclear type, no records Exercise level: Very low, she is not comfortable standing for long. Current diet: Lots of soups  Today: She says she has been okay, but mostly the same. She still feels frequently fatigued on a day-to-day basis.   She has been experiencing random allergy attacks that occur about twice daily. She experienced one in-clinic today right before the visit. She states she will yawn frequently but cannot seem to get enough oxygen.   She reports difficulty breathing in hot climates, which improves upon going in air conditioned spaces. This does not trigger allergy attacks, however.   Additionally, she has been experiencing bilateral LE discomfort which she believes may be related to her nerves. She also complains of back pain that has so far remained unresolved.   She has not been exercising or playing pickle ball recently because of renovations at the [facility] and recent travels.   The only medications she takes regularly are tamoxifen and lactulose. All other  medications she takes more infrequently.   She denies any palpitations, chest pain, or peripheral edema. No lightheadedness, headaches, syncope, orthopnea, or PND.   Past Medical History:  Diagnosis Date   Abnormal Pap smear of cervix 12/2009   ASCUS cannot exclude HGSIL.   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 From [facility]     Past Surgical History:  Procedure Laterality Date   BREAST LUMPECTOMY Left 07/02/2019   BREAST LUMPECTOMY WITH RADIOACTIVE SEED AND SENTINEL LYMPH NODE BIOPSY Left 07/02/2019   Procedure: LEFT BREAST LUMPECTOMY WITH RADIOACTIVE SEED AND SENTINEL LYMPH NODE BIOPSY;  Surgeon: Jovita Kussmaul, MD;  Location: [place];  Service: General;  Laterality: Left;   BUNIONECTOMY Right    COLPOSCOPY  12/2009   negative   EYE SURGERY     cataracts   FOOT SURGERY Right    bone removed    hardward removal from right foot  04/2014   Dr. Wardell Honour   JOINT REPLACEMENT Right 2001   Hip replacement    Posterior vaginal repair  10/21/2012   Procedure: VAGINAL REPAIR POSTERIOR; Surgeon: Daneil Dolin, MD; Location: [place]; Service: Gynecology;;  SINOSCOPY     TONSILLECTOMY     TOTAL HIP ARTHROPLASTY Left 01/2021   at [place]  10/21/2012   Procedure: UTEROSACRAL SUSPENSION; Surgeon: Daneil Dolin, MD; Location[facility] MAIN OR; Service: Gynecology;;   VAGINAL HYSTERECTOMY  10/21/2012   Procedure: HYSTERECTOMY VAGINAL; Surgeon: Daneil Dolin, MD; Location: [place]; Service:  Gynecology; Laterality: N/A; bilateral salpingectomy    Current Medications: Current Outpatient Medications on File Prior to Visit  Medication Sig   aspirin-acetaminophen-caffeine (EXCEDRIN MIGRAINE) 250-250-65 MG tablet Take by mouth as needed.    azelastine (OPTIVAR) 0.05 % ophthalmic solution Place 1 drop into both eyes 2 (two) times daily.   Carbinoxamine Maleate 4 MG TABS TAKE 1 TABLET BY MOUTH EVERY 8 HOURS AS NEEDED   diazepam (VALIUM) 10 MG tablet 10 mg at bedtime. Uses vaginally for pain   ibuprofen (ADVIL,MOTRIN) 200 MG tablet Take 200 mg by mouth every 6 (six) hours as needed.   ipratropium (ATROVENT) 0.03 % nasal spray Place 2 sprays into both nostrils every 12 (twelve) hours.   lactulose (CHRONULAC) 10 GM/15ML solution SMARTSIG:15 Milliliter(s) By Mouth 3 Times Daily   lactulose (CHRONULAC) 10 GM/15ML solution TAKE 15 MLS BY MOUTH 2 TIMES DAILY.   omeprazole (PRILOSEC) 20 MG capsule TAKE 1 CAPSULE BY MOUTH EVERY DAY   oxybutynin (DITROPAN-XL) 5 MG 24 hr tablet TAKE 1 TABLET BY MOUTH EVERYDAY AT BEDTIME   tamoxifen (NOLVADEX) 10 MG tablet Take 1 tablet (10 mg total) by mouth 2 (two) times daily.   No current facility-administered medications on file prior to visit.     Allergies:   Nitrofurantoin, Cephalexin, Ciprofloxacin, and Sulfa antibiotics   Social History   Tobacco Use   Smoking status: Never   Smokeless tobacco: Never  Vaping Use   Vaping Use: Never used  Substance Use Topics   Alcohol use: Yes    Alcohol/week: 4.0 standard drinks of alcohol    Types: 4 Glasses of wine per week    Comment: glass of wine with dinner   Drug use: Never    Family History: family history includes Cancer in her father; Crohn's disease in her son; Osteoarthritis in her brother and sister; Rheum arthritis in her mother. There is no history of Allergic rhinitis, Asthma, Eczema, or Urticaria.  ROS:   Please see the history of present illness.   (+) Shortness of breath (+) Fatigue   (+) Bilateral LE discomfort  Additional pertinent ROS otherwise negative.   EKGs/Labs/Other Studies Reviewed:    The following studies were reviewed today:  Echo 11/01/2021: 1. Left ventricular ejection fraction, by estimation, is 60 to 65%. Left  ventricular ejection fraction by 3D volume is 63 %. The left ventricle has  normal function. The left ventricle has no regional wall motion  abnormalities. Left ventricular diastolic   parameters are consistent with Grade I diastolic dysfunction (impaired  relaxation).   2. Right ventricular systolic function is normal. The right ventricular  size is normal. Tricuspid regurgitation signal is inadequate for assessing  PA pressure.   3. The mitral valve is grossly normal. No evidence of mitral valve  regurgitation.   4. The aortic valve is tricuspid. Aortic valve regurgitation is not  visualized.   5. Aortic dilatation noted. There is borderline dilatation of the aortic  root, measuring 38 mm.   6. The inferior vena cava is normal in size with greater than 50%  respiratory variability, suggesting right atrial pressure of 3 mmHg.  LE Doppler Study 11/21/17:  Final Interpretation:  Right: Resting right ankle-brachial index is within normal range. No  evidence of significant right lower extremity arterial disease.   Left: Resting left ankle-brachial index is within normal range. No  evidence of significant left lower extremity arterial disease.   EKG:  EKG is personally reviewed.   01/21/22: not ordered today  10/19/21: NSR at 81 bpm, iRBBB, nonspecific ST pattern. Unchanged from 2019  Recent Labs: 10/11/2021: ALT 10; BUN 14; Creatinine, Ser 0.99; Hemoglobin 14.1; Platelets 277; Potassium 3.7; Sodium 140 10/19/2021: TSH 1.620   Recent Lipid Panel    Component Value Date/Time   CHOL 229 (H) 11/12/2021 0935   TRIG 91 11/12/2021 0935   HDL 69 11/12/2021 0935   CHOLHDL 3.3 11/12/2021 0935   CHOLHDL 3.4 07/06/2015 1108   VLDL 18  07/06/2015 1108   LDLCALC 144 (H) 11/12/2021 0935    Physical Exam:    VS:  BP 110/76 (BP Location: Right Arm, Patient Position: Sitting, Cuff Size: Normal)   Pulse 90   Ht '5\' 11"'$  (1.803 m)   Wt 185 lb 11.2 oz (84.2 kg)   LMP 05/13/2005   SpO2 95%   BMI 25.90 kg/m     Wt Readings from Last 3 Encounters:  01/21/22 185 lb 11.2 oz (84.2 kg)  11/08/21 183 lb 3.2 oz (83.1 kg)  10/19/21 183 lb 14.4 oz (83.4 kg)    GEN: Well nourished, well developed in no acute distress HEENT: Normal, moist mucous membranes NECK: No JVD CARDIAC: regular rhythm, normal S1 and S2, no rubs or gallops. No murmur. VASCULAR: Radial and DP pulses 2+ bilaterally. No carotid bruits RESPIRATORY:  Clear to auscultation without rales, wheezing or rhonchi  ABDOMEN: Soft, non-tender, non-distended MUSCULOSKELETAL:  Ambulates independently SKIN: Warm and dry, no edema NEUROLOGIC:  Alert and oriented x 3. No focal neuro deficits noted. PSYCHIATRIC:  Normal affect    ASSESSMENT:    1. Chronic fatigue   2. History of breast cancer   3. Postural dizziness with presyncope   4. Cardiac risk counseling     PLAN:    Postural dizziness Presyncope Fatigue -sensation of movement sounds more consistent with vertigo. She does have a history of migraines, but denies any recently -has a history of breast cancer, but did not receive systemic chemotherapy (was on tamoxifen) -echo, labs unremarkable -we have discussion compression stockings, hydration. Prescription for compression stockings given today -recommend increasing activity as tolerated  Cardiac risk counseling and prevention recommendations: -recommend heart healthy/Mediterranean diet, with whole grains, fruits, vegetable, fish, lean meats, nuts, and olive oil. Limit salt. -recommend moderate walking, 3-5 times/week for 30-50 minutes each session. Aim for at least 150 minutes.week. Goal should be pace of 3 miles/hours, or walking 1.5 miles in 30  minutes -recommend avoidance of tobacco products. Avoid excess alcohol. -ASCVD risk score: The 10-year ASCVD risk score (Arnett DK, et al., 2019) is: 5%   Values used to calculate the score:     Age: 72 years     Sex: Female     Is Non-Hispanic African American: No     Diabetic: No     Tobacco smoker: No     Systolic Blood Pressure: 644 mmHg     Is BP treated: No     HDL Cholesterol: 69 mg/dL     Total Cholesterol: 229 mg/dL    Plan for follow up: I would be happy to see her back as needed  Buford Dresser, MD, PhD, [facility]  [place]  [facility]    Medication Adjustments/Labs and Tests Ordered: Current medicines are reviewed at length with the patient today.  Concerns regarding medicines are outlined above.   No orders of the defined types were placed in this encounter.  Meds ordered this encounter  Medications   Elastic Bandages & Supports (MEDICAL COMPRESSION SOCKS) MISC    Sig: 15-20 MMHG COMPRESSION SOCKS TO BE WORN DAILY    Dispense:  1 each    Refill:  5   Patient Instructions  Medication Instructions:  Your physician recommends that you continue on your current medications as directed. Please refer to the Current Medication list given to you today.  Labwork: NONE  Testing/Procedures: NONE  Follow-Up: AS NEEDED   WEAR COMPRESSION SOCKS DAILY       I,Breanna Adamick,acting as a scribe for [employer], MD.,have documented all relevant documentation on the behalf of Buford Dresser, MD,as directed by  Buford Dresser, MD while in the presence of Buford Dresser, MD.   I, Buford Dresser, MD, have reviewed all documentation for this visit. The documentation on 01/21/22 for the exam, diagnosis, procedures, and orders are all accurate and complete.   Signed, Buford Dresser, MD PhD 01/21/2022     [place]

## 2022-01-21 NOTE — Patient Instructions (Addendum)
Medication Instructions:  Your physician recommends that you continue on your current medications as directed. Please refer to the Current Medication list given to you today.  Labwork: NONE  Testing/Procedures: NONE  Follow-Up: AS NEEDED   WEAR COMPRESSION SOCKS DAILY

## 2022-01-23 ENCOUNTER — Ambulatory Visit: Payer: Medicare PPO | Admitting: Podiatry

## 2022-01-23 DIAGNOSIS — S91201A Unspecified open wound of right great toe with damage to nail, initial encounter: Secondary | ICD-10-CM

## 2022-01-23 NOTE — Progress Notes (Signed)
Chief Complaint  Patient presents with   Nail Problem    Patient is here for right great toe to be cleaned up, she states that the nail fell off a few months ago and just wants to get it  checked out.    HPI: 65 y.o. female presenting today as a new patient for evaluation of loss of the right great toenail plate.  Patient states that the toenail came off and she cannot recall any incident that would have elicited the toenail falling off.  There is no pain associated to the area.  There is no drainage.  States that she does have a history of toenails falling off. She would like to have it evaluated however.  Past Medical History:  Diagnosis Date   Abnormal Pap smear of cervix 12/2009   ASCUS cannot exclude HGSIL.   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 From [facility]     Past Surgical History:  Procedure Laterality Date   BREAST LUMPECTOMY Left 07/02/2019   BREAST LUMPECTOMY WITH RADIOACTIVE SEED AND SENTINEL LYMPH NODE BIOPSY Left 07/02/2019   Procedure: LEFT BREAST LUMPECTOMY WITH RADIOACTIVE SEED AND SENTINEL LYMPH NODE BIOPSY;  Surgeon: Jovita Kussmaul, MD;  Location: [facility];  Service: General;  Laterality: Left;   BUNIONECTOMY Right    COLPOSCOPY  12/2009   negative   EYE SURGERY     cataracts   FOOT SURGERY Right    bone removed    hardward removal from right foot  04/2014   Dr. Wardell Honour   JOINT REPLACEMENT Right 2001   Hip replacement    Posterior vaginal repair  10/21/2012   Procedure: VAGINAL REPAIR POSTERIOR; Surgeon: Daneil Dolin, MD; Location: [facility] MAIN OR; Service: Gynecology;;   SINOSCOPY     TONSILLECTOMY     TOTAL HIP ARTHROPLASTY Left 01/2021   at [place]  10/21/2012   Procedure: UTEROSACRAL SUSPENSION; Surgeon: Daneil Dolin, MD; Location: [facility] MAIN OR; Service: Gynecology;;   VAGINAL HYSTERECTOMY  10/21/2012   Procedure: HYSTERECTOMY VAGINAL; Surgeon: Daneil Dolin, MD; Location: [place]; Service: Gynecology; Laterality: N/A; bilateral salpingectomy    Allergies  Allergen Reactions   Nitrofurantoin Other (See Comments)   Cephalexin Diarrhea   Ciprofloxacin Diarrhea   Sulfa Antibiotics Other (See Comments)    Stomach cramps and diarrhea     Physical Exam: General: The patient is alert and oriented x3 in no acute distress.  Dermatology: Absence of the right hallux nail plate noted with stable healthy underlying nailbed.  The nailbed is dry with healthy underlying skin.  There is a small nail spicule to the medial border of the right hallux nail plate.  No bleeding.  No inflammation.  Neurovascular status intact  Musculoskeletal Exam: No pedal deformities noted  Assessment: 1.  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 Patient evaluated.  2.  Light debridement of the nail spicule was performed  to the medial border of the right hallux nail plate without incident or bleeding 3.  OTC topical Tolcylen antifungal gel was dispensed at checkout to apply daily while the nail regrows 4.  Return to clinic as needed     Edrick Kins, DPM [facility]  Dr. Edrick Kins, DPM    [address], [place] [address]                Office [phone]  Fax [phone]

## 2022-01-31 ENCOUNTER — Ambulatory Visit
Admission: RE | Admit: 2022-01-31 | Discharge: 2022-01-31 | Disposition: A | Discharge status: Home / Self Care | Payer: Medicare PPO | Source: Ambulatory Visit | Attending: Hematology and Oncology | Admitting: Hematology and Oncology

## 2022-01-31 DIAGNOSIS — Z170 Estrogen receptor positive status [ER+]: Secondary | ICD-10-CM

## 2022-02-06 ENCOUNTER — Ambulatory Visit: Payer: Medicare PPO | Attending: Obstetrics & Gynecology | Admitting: Physical Therapy

## 2022-02-06 ENCOUNTER — Other Ambulatory Visit: Payer: Self-pay

## 2022-02-06 DIAGNOSIS — R3915 Urgency of urination: Secondary | ICD-10-CM | POA: Diagnosis not present

## 2022-02-06 DIAGNOSIS — R279 Unspecified lack of coordination: Secondary | ICD-10-CM | POA: Diagnosis present

## 2022-02-06 DIAGNOSIS — M6281 Muscle weakness (generalized): Secondary | ICD-10-CM | POA: Insufficient documentation

## 2022-02-06 DIAGNOSIS — N3281 Overactive bladder: Secondary | ICD-10-CM | POA: Diagnosis not present

## 2022-02-06 DIAGNOSIS — R293 Abnormal posture: Secondary | ICD-10-CM | POA: Diagnosis present

## 2022-02-06 NOTE — Therapy (Signed)
OUTPATIENT PHYSICAL THERAPY FEMALE PELVIC EVALUATION   Patient Name: Tryniti Laatsch MRN: [id number] DOB:1955-06-30, 66 y.o., female Today's Date: 02/06/2022   PT End of Session - 02/06/22 1231     Visit Number 1    Date for PT Re-Evaluation 05/08/22    Authorization Type [employer]    PT Start Time 1230    PT Stop Time 1310    PT Time Calculation (min) 40 min    Activity Tolerance Patient tolerated treatment well    Behavior During Therapy [facility] for tasks assessed/performed             Past Medical History:  Diagnosis Date   Abnormal Pap smear of cervix 12/2009   ASCUS cannot exclude HGSIL.   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 From [facility]    Past Surgical History:  Procedure Laterality Date   BREAST LUMPECTOMY Left 07/02/2019   BREAST LUMPECTOMY WITH RADIOACTIVE SEED AND SENTINEL LYMPH NODE BIOPSY Left 07/02/2019   Procedure: LEFT BREAST LUMPECTOMY WITH RADIOACTIVE SEED AND SENTINEL LYMPH NODE BIOPSY;  Surgeon: Jovita Kussmaul, MD;  Location: [employer];  Service: General;  Laterality: Left;   BUNIONECTOMY Right    COLPOSCOPY  12/2009   negative   EYE SURGERY     cataracts   FOOT SURGERY Right    bone removed    hardward removal from right foot  04/2014   Dr. Wardell Honour   JOINT REPLACEMENT Right 2001   Hip replacement    Posterior vaginal repair  10/21/2012   Procedure: VAGINAL REPAIR POSTERIOR; Surgeon: Daneil Dolin, MD; Location: [facility] MAIN OR; Service: Gynecology;;   SINOSCOPY     TONSILLECTOMY      TOTAL HIP ARTHROPLASTY Left 01/2021   at [place]  10/21/2012   Procedure: UTEROSACRAL SUSPENSION; Surgeon: Daneil Dolin, MD; Location: [facility] MAIN OR; Service: Gynecology;;   VAGINAL HYSTERECTOMY  10/21/2012   Procedure: HYSTERECTOMY VAGINAL; Surgeon: Daneil Dolin, MD; Location: [place]; Service: Gynecology; Laterality: N/A; bilateral salpingectomy   Patient Active Problem List   Diagnosis Date Noted   Malignant neoplasm of upper-inner quadrant of left breast, estrogen receptor positive ([place]) 04/27/2019   Perennial allergic rhinitis with a predominantly nonallergic component 04/13/2018   Dyspnea 04/13/2018   Allergic conjunctivitis 04/13/2018   GERD (gastroesophageal reflux disease) 11/28/2017   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   Urinary frequency 07/07/2015   Rectal pressure 07/07/2015   Arthralgia of hip 03/22/2014   Arthralgia, sacroiliac 03/22/2014   ANA positive 03/10/2014   Gilles de la Tourette's syndrome 03/06/2014   Uterovaginal prolapse 09/17/2012    PCP: Benay Pike, MD  REFERRING PROVIDER: [name], MD  REFERRING DIAG: N32.81 (ICD-10-CM) - OAB (overactive bladder) R39.15 (ICD-10-CM) - Urinary urgency  THERAPY DIAG:  Muscle weakness (generalized)  Abnormal posture  Unspecified lack of coordination  Rationale for Evaluation and Treatment Rehabilitation  ONSET DATE: 2016  SUBJECTIVE:  SUBJECTIVE STATEMENT: "I feel a jittery sensation at bladder, rectum, and bladder". "I also feel the need to go to the bathroom frequently. I have had PT before but this didn't help a lot."  Pt states this has been a constant sensation of tingling and vibration sensation since 2016 after hysterectomy and rectal repair.   Fluid intake: Yes:  frequently drinking water, does drink tea daily with medication, orange juice 6oz most days, iced coffee every other day     PAIN:  Are you having pain? NO - does have constant tingling/vibration sensation at pelvic area  PRECAUTIONS: None  WEIGHT BEARING RESTRICTIONS No  FALLS:  Has patient fallen in last 6 months? No  LIVING ENVIRONMENT: Lives with: lives with their family Lives in: House/apartment  OCCUPATION: retired  PLOF: Independent  PATIENT GOALS to have less of this sensation   PERTINENT HISTORY:  rectocele repair, Lt breast CA estrogen (+) hysterectomy, Malignant neoplasm of upper-inner quadrant of left breast in female, estrogen receptor positive Sexual abuse: No  BOWEL MOVEMENT Pain with bowel movement: Yes - pain felt on rt side "about an inch internally at rectum" if there is a strain felt/needed but does state she has an internal hemorrhoid.   Type of bowel movement:Type (Bristol Stool Scale) 3-4, Frequency every other day to third day, and Strain No Fully empty rectum: Yes:   Leakage: No Pads: No Fiber supplement: Yes: lactulose, Senna plus  URINATION Pain with urination: No Fully empty bladder: No always feels like there is something left and strains to fully empty Stream: Strong and Weak Urgency: Yes:   Frequency: nightly 4x; usually every 45 mins Leakage: Coughing Pads: No  INTERCOURSE Pain with intercourse: Initial Penetration and During Penetration Ability to have vaginal penetration:  Yes:   Climax: no able to achieve climax  Marinoff Scale: 1/3  PREGNANCY Vaginal deliveries 2 Tearing No C-section deliveries 0 Currently pregnant No  PROLAPSE None    OBJECTIVE:   DIAGNOSTIC FINDINGS:    COGNITION:  Overall cognitive status: Within functional limits for tasks assessed     SENSATION:  Light touch: Appears intact - does endorse the tingling at pelvic area but no numbness  Proprioception: Appears intact  MUSCLE LENGTH: Bil  hamstrings and adductors limited by 25%                POSTURE: rounded shoulders, forward head, and posterior pelvic tilt    LUMBARAROM/PROM  A/PROM A/PROM  eval  Flexion Decreased by 25%  Extension WFL  Right lateral flexion Decreased by 25%  Left lateral flexion Decreased by 25%  Right rotation Decreased by 25%  Left rotation Decreased by 25%   (Blank rows = not tested)  LOWER EXTREMITY ROM:  WFL  LOWER EXTREMITY MMT:  Bil hips grossly 4/5, knees and ankles 5/5   PALPATION:   General  no TTP at abdomen, TTP at anterior pelvis                 External Perineal Exam mild dryness noted at upper portion of vulva, mild tissue restrictions at upper portion of vulva as well without pain                             Internal Pelvic Floor mild TTP at bil deep layer of pelvic floor muscles   Patient confirms identification and approves PT to assess internal pelvic floor and treatment Yes  PELVIC MMT:   MMT eval  Vaginal 4/5; 9s; 8 reps  Internal Anal Sphincter   External Anal Sphincter   Puborectalis   Diastasis Recti   (Blank rows = not tested)        TONE: Slightly increased  PROLAPSE: Not seen in hooklying   TODAY'S TREATMENT  EVAL Examination completed, findings reviewed, pt educated on POC, HEP, and moisturizers. Pt motivated to participate in PT and agreeable to attempt recommendations.     PATIENT EDUCATION:  Education details: [id number] Person educated: Patient Education method: Explanation, Demonstration, Tactile cues, Verbal cues, and Handouts Education comprehension: verbalized understanding and returned demonstration   HOME EXERCISE PROGRAM: [id number]  ASSESSMENT:  CLINICAL IMPRESSION: Patient is a 66 y.o. female  who was seen today for physical therapy evaluation and treatment for increased urinary frequency, urgency, and constant sensation of vibration or tingling throughout pelvic floor at bladder, vulva, vagina and rectal areas. Pt very  bothered by this and reports it has been going on since 2016 with hysterectomy and rectocele repair surgery. Pt found to have mild decreased flexibility in spine and bil hips, fascial restrictions in lower abdomen and over bladder, mild TTP at anterior pelvis/over bladder. Pt consented to internal vaginal assessment this date and found to have very mild decreased strength, coordination, and endurance however did have dryness and decreased tissue mobility at upper vulva, TTP internally at bil deep layer of pelvic floor. Given HEP and moisturizer handout and reviewed during session. Pt would benefit from additional PT to further address deficits.       OBJECTIVE IMPAIRMENTS decreased coordination, decreased endurance, decreased mobility, decreased strength, increased fascial restrictions, increased muscle spasms, impaired flexibility, improper body mechanics, postural dysfunction, and pain.   ACTIVITY LIMITATIONS continence  PARTICIPATION LIMITATIONS: interpersonal relationship and community activity  PERSONAL FACTORS Past/current experiences, Time since onset of injury/illness/exacerbation, and 1 comorbidity: medical history  are also affecting patient's functional outcome.   REHAB POTENTIAL: Good  CLINICAL DECISION MAKING: Stable/uncomplicated  EVALUATION COMPLEXITY: Low   GOALS: Goals reviewed with patient? Yes  SHORT TERM GOALS: Target date: 03/06/2022  Pt to be I with HEP.  Baseline: Goal status: INITIAL  2.  Pt will report her voiding habits are more complete due to improved breathing and voiding techniques.  Baseline:  Goal status: INITIAL  3.  Pt will have 25% less urgency due to bladder retraining and strengthening  Baseline:  Goal status: INITIAL  4.  Pt to report improved time between bladder voids to at least 2 hours and 2-3x per night for improved QOL with decreased urinary frequency.   Baseline:  Goal status: INITIAL   LONG TERM GOALS: Target date:  05/08/22     Pt to be I with advanced HEP.  Baseline:  Goal status: INITIAL  2.  Pt will have 50% less urgency due to bladder retraining and strengthening  Baseline:  Goal status: INITIAL  3.  Pt to report improved time between bladder voids to at least 3 hours and 1-2x per night for improved QOL with decreased urinary frequency.   Baseline:  Goal status: INITIAL  4.  Pt to report 50% decrease in bothersome sensation at pelvic area of tingling and vibration sensation felt for improved QOL.  Baseline:  Goal status: INITIAL  5.  Pt to demonstrate at least 4/5 pelvic floor strength with ability to completely relax pelvic floor after contraction without pain for improved pelvic stability and decreased strain at pelvic floor/ decrease leakage.  Baseline:  Goal status: INITIAL   PLAN: PT FREQUENCY:  every other week  PT DURATION:  8 sessions  PLANNED INTERVENTIONS: Therapeutic exercises, Therapeutic activity, Neuromuscular re-education, Patient/Family education, Self Care, Joint mobilization, Aquatic Therapy, Dry Needling, Electrical stimulation, Spinal mobilization, Cryotherapy, Moist heat, scar mobilization, Taping, Biofeedback, and Manual therapy  PLAN FOR NEXT SESSION: manual at abdomen, external and/or internal pelvic floor as needed and with pt consent; stretching at hip and core  Stacy Gardner, PT, DPT 09/27/234:24 PM

## 2022-02-06 NOTE — Patient Instructions (Signed)

## 2022-02-10 ENCOUNTER — Encounter (HOSPITAL_BASED_OUTPATIENT_CLINIC_OR_DEPARTMENT_OTHER): Payer: Self-pay | Admitting: Cardiology

## 2022-02-27 ENCOUNTER — Other Ambulatory Visit: Payer: Medicare PPO

## 2022-02-27 ENCOUNTER — Ambulatory Visit: Payer: Medicare PPO | Admitting: Hematology and Oncology

## 2022-03-05 ENCOUNTER — Ambulatory Visit: Payer: Medicare PPO | Attending: Obstetrics & Gynecology | Admitting: Physical Therapy

## 2022-03-05 DIAGNOSIS — R279 Unspecified lack of coordination: Secondary | ICD-10-CM | POA: Insufficient documentation

## 2022-03-05 DIAGNOSIS — M6281 Muscle weakness (generalized): Secondary | ICD-10-CM | POA: Insufficient documentation

## 2022-03-05 NOTE — Patient Instructions (Signed)

## 2022-03-05 NOTE — Therapy (Addendum)
OUTPATIENT PHYSICAL THERAPY FEMALE PELVIC TREATMENT   Patient Name: Lauren Osborne MRN: [id number] DOB:05/26/1955, 66 y.o., female Today's Date: 03/05/2022   PT End of Session - 03/05/22 1100     Visit Number 2    Date for PT Re-Evaluation 05/08/22    Authorization Type [employer]    PT Start Time 1100    PT Stop Time 1145    PT Time Calculation (min) 45 min    Activity Tolerance Patient tolerated treatment well    Behavior During Therapy [facility] for tasks assessed/performed              Past Medical History:  Diagnosis Date   Abnormal Pap smear of cervix 12/2009   ASCUS cannot exclude HGSIL.   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 From [facility]    Past Surgical History:  Procedure Laterality Date   BREAST LUMPECTOMY Left 07/02/2019   BREAST LUMPECTOMY WITH RADIOACTIVE SEED AND SENTINEL LYMPH NODE BIOPSY Left 07/02/2019   Procedure: LEFT BREAST LUMPECTOMY WITH RADIOACTIVE SEED AND SENTINEL LYMPH NODE BIOPSY;  Surgeon: Jovita Kussmaul, MD;  Location: [place];  Service: General;  Laterality: Left;   BUNIONECTOMY Right    COLPOSCOPY  12/2009   negative   EYE SURGERY     cataracts   FOOT SURGERY Right    bone removed    hardward removal from right foot  04/2014   Dr. Wardell Honour   JOINT REPLACEMENT Right 2001   Hip replacement    Posterior vaginal repair  10/21/2012   Procedure: VAGINAL REPAIR POSTERIOR; Surgeon: Daneil Dolin, MD; Location: [facility] MAIN OR; Service: Gynecology;;   SINOSCOPY     TONSILLECTOMY      TOTAL HIP ARTHROPLASTY Left 01/2021   at [place]  10/21/2012   Procedure: UTEROSACRAL SUSPENSION; Surgeon: Daneil Dolin, MD; Location: [facility] MAIN OR; Service: Gynecology;;   VAGINAL HYSTERECTOMY  10/21/2012   Procedure: HYSTERECTOMY VAGINAL; Surgeon: Daneil Dolin, MD; Location: [place]; Service: Gynecology; Laterality: N/A; bilateral salpingectomy   Patient Active Problem List   Diagnosis Date Noted   Malignant neoplasm of upper-inner quadrant of left breast, estrogen receptor positive ([place]) 04/27/2019   Perennial allergic rhinitis with a predominantly nonallergic component 04/13/2018   Dyspnea 04/13/2018   Allergic conjunctivitis 04/13/2018   GERD (gastroesophageal reflux disease) 11/28/2017   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   Urinary frequency 07/07/2015   Rectal pressure 07/07/2015   Arthralgia of hip 03/22/2014   Arthralgia, sacroiliac 03/22/2014   ANA positive 03/10/2014   Gilles de la Tourette's syndrome 03/06/2014   Uterovaginal prolapse 09/17/2012    PCP: Benay Pike, MD  REFERRING PROVIDER: [name], MD  REFERRING DIAG: N32.81 (ICD-10-CM) - OAB (overactive bladder) R39.15 (ICD-10-CM) - Urinary urgency  THERAPY DIAG:  Muscle weakness (generalized)  Unspecified lack of coordination  Rationale for Evaluation and Treatment Rehabilitation  ONSET DATE: 2016  SUBJECTIVE:  SUBJECTIVE STATEMENT: Pt reports sensation continues, urgency to urinate is still constant, urinary frequency intermittent. Pt reports she sometimes thinks the sensation is worse depending on where stool is in colon.    Fluid intake: Yes: frequently drinking water, does drink tea daily with medication, orange juice 6oz most days, iced coffee every other day      PAIN:  Are you having pain? NO - does have constant tingling/vibration sensation at pelvic area  PRECAUTIONS: None  WEIGHT BEARING RESTRICTIONS No  FALLS:  Has patient fallen in last 6 months? No  LIVING ENVIRONMENT: Lives with: lives with their family Lives in: House/apartment  OCCUPATION: retired  PLOF: Independent  PATIENT GOALS to have less of this sensation   PERTINENT HISTORY:  rectocele repair, Lt breast CA estrogen (+) hysterectomy, Malignant neoplasm of upper-inner quadrant of left breast in female, estrogen receptor positive Sexual abuse: No  BOWEL MOVEMENT Pain with bowel movement: Yes - pain felt on rt side "about an inch internally at rectum" if there is a strain felt/needed but does state she has an internal hemorrhoid.   Type of bowel movement:Type (Bristol Stool Scale) 3-4, Frequency every other day to third day, and Strain No Fully empty rectum: Yes:   Leakage: No Pads: No Fiber supplement: Yes: lactulose, Senna plus  URINATION Pain with urination: No Fully empty bladder: No always feels like there is something left and strains to fully empty Stream: Strong and Weak Urgency: Yes:   Frequency: nightly 4x; usually every 45 mins Leakage: Coughing Pads: No  INTERCOURSE Pain with intercourse: Initial Penetration and During Penetration Ability to have vaginal penetration:  Yes:   Climax: no able to achieve climax  Marinoff Scale: 1/3  PREGNANCY Vaginal deliveries 2 Tearing No C-section deliveries 0 Currently pregnant No  PROLAPSE None    OBJECTIVE:   DIAGNOSTIC FINDINGS:    COGNITION:  Overall cognitive status: Within functional limits for tasks assessed     SENSATION:  Light touch: Appears intact - does endorse the tingling at pelvic area but no numbness  Proprioception: Appears intact  MUSCLE LENGTH: Bil hamstrings and adductors limited by 25%                POSTURE: rounded shoulders, forward head, and posterior pelvic  tilt    LUMBARAROM/PROM  A/PROM A/PROM  eval  Flexion Decreased by 25%  Extension WFL  Right lateral flexion Decreased by 25%  Left lateral flexion Decreased by 25%  Right rotation Decreased by 25%  Left rotation Decreased by 25%   (Blank rows = not tested)  LOWER EXTREMITY ROM:  WFL  LOWER EXTREMITY MMT:  Bil hips grossly 4/5, knees and ankles 5/5   PALPATION:   General  no TTP at abdomen, TTP at anterior pelvis                 External Perineal Exam mild dryness noted at upper portion of vulva, mild tissue restrictions at upper portion of vulva as well without pain                             Internal Pelvic Floor mild TTP at bil deep layer of pelvic floor muscles   Patient confirms identification and approves PT to assess internal pelvic floor and treatment Yes  PELVIC MMT:   MMT eval  Vaginal 4/5; 9s; 8 reps  Internal Anal Sphincter   External Anal Sphincter   Puborectalis   Diastasis Recti   (  Blank rows = not tested)        TONE: Slightly increased  PROLAPSE: Not seen in hooklying   TODAY'S TREATMENT  03/05/22  No emotional/communication barriers or cognitive limitation. Patient is motivated to learn. Patient understands and agrees with treatment goals and plan. PT explains patient will be examined in standing, sitting, and lying down to see how their muscles and joints work. When they are ready, they will be asked to remove their underwear so PT can examine their perineum. The patient is also given the option of providing their own chaperone as one is not provided in our facility. The patient also has the right and is explained the right to defer or refuse any part of the evaluation or treatment including the internal exam. With the patient's consent, PT will use one gloved finger to gently assess the muscles of the pelvic floor, seeing how well it contracts and relaxes and if there is muscle symmetry. After, the patient will get dressed and PT and patient  will discuss exam findings and plan of care. PT and patient discuss plan of care, schedule, attendance policy and HEP activities.  Pt consented to internal and external vaginal pelvic floor treatment today: Manual - pt had tension and mild TTP at lt side of pelvic floor and focus of treatment on lt side. Gentle stretching at bulbocavernosus and pt demonstrated improved tissue mobility, no pain. With release of superficial pelvic floor muscle then progressed to deeper muscle palpation to iliococcygeus and obturator internus at Lt side with trigger points felt throughout these muscles, all released well with gentle over pressure. Pt asked how she can work on this at home other than HEP stretches and pt educated on pelvic wand. Pt tolerated well and reports she felt better at end of session and tension/tenderness she felt with muscle palpation replicates her pain or tightness in Lt hip/abdomen.  Pt does have tension at lower abdomen and gentle fascial release here and released well and pt directed in diaphragmatic breathing x10 for improved relaxation.  Self care - pt educated on urge drill and handout given   PATIENT EDUCATION:  Education details: [id number] Person educated: Patient Education method: Explanation, Demonstration, Tactile cues, Verbal cues, and Handouts Education comprehension: verbalized understanding and returned demonstration   HOME EXERCISE PROGRAM: [id number]  ASSESSMENT:  CLINICAL IMPRESSION: Patient session focused on internal vaginal manual work for tension release and improved mobility for improved pelvic floor relaxation and decreased pain/tension. Pt tolerated manual work well and reported feeling better at end of session. Pt would benefit from additional PT to further address deficits.       OBJECTIVE IMPAIRMENTS decreased coordination, decreased endurance, decreased mobility, decreased strength, increased fascial restrictions, increased muscle spasms, impaired flexibility,  improper body mechanics, postural dysfunction, and pain.   ACTIVITY LIMITATIONS continence  PARTICIPATION LIMITATIONS: interpersonal relationship and community activity  PERSONAL FACTORS Past/current experiences, Time since onset of injury/illness/exacerbation, and 1 comorbidity: medical history  are also affecting patient's functional outcome.   REHAB POTENTIAL: Good  CLINICAL DECISION MAKING: Stable/uncomplicated  EVALUATION COMPLEXITY: Low   GOALS: Goals reviewed with patient? Yes  SHORT TERM GOALS: Target date: 03/06/2022  Pt to be I with HEP.  Baseline: Goal status: INITIAL  2.  Pt will report her voiding habits are more complete due to improved breathing and voiding techniques.  Baseline:  Goal status: INITIAL  3.  Pt will have 25% less urgency due to bladder retraining and strengthening  Baseline:  Goal status: INITIAL  4.  Pt to report improved time between bladder voids to at least 2 hours and 2-3x per night for improved QOL with decreased urinary frequency.   Baseline:  Goal status: INITIAL   LONG TERM GOALS: Target date:  05/08/22    Pt to be I with advanced HEP.  Baseline:  Goal status: INITIAL  2.  Pt will have 50% less urgency due to bladder retraining and strengthening  Baseline:  Goal status: INITIAL  3.  Pt to report improved time between bladder voids to at least 3 hours and 1-2x per night for improved QOL with decreased urinary frequency.   Baseline:  Goal status: INITIAL  4.  Pt to report 50% decrease in bothersome sensation at pelvic area of tingling and vibration sensation felt for improved QOL.  Baseline:  Goal status: INITIAL  5.  Pt to demonstrate at least 4/5 pelvic floor strength with ability to completely relax pelvic floor after contraction without pain for improved pelvic stability and decreased strain at pelvic floor/ decrease leakage.  Baseline:  Goal status: INITIAL   PLAN: PT FREQUENCY: every other week  PT DURATION:  8  sessions  PLANNED INTERVENTIONS: Therapeutic exercises, Therapeutic activity, Neuromuscular re-education, Patient/Family education, Self Care, Joint mobilization, Aquatic Therapy, Dry Needling, Electrical stimulation, Spinal mobilization, Cryotherapy, Moist heat, scar mobilization, Taping, Biofeedback, and Manual therapy  PLAN FOR NEXT SESSION: manual at abdomen, external and/or internal pelvic floor as needed and with pt consent; stretching at hip and core  Otelia Sergeant, PT, DPT 03/06/2311:02 PM   PHYSICAL THERAPY DISCHARGE SUMMARY  Visits from Start of Care: 2  Current functional level related to goals / functional outcomes: Unable to formally reassess as pt did not return after treatment   Remaining deficits: Unable to formally reassess as pt did not return after treatment   Education / Equipment: HEP   Patient agrees to discharge. Patient goals were not met. Patient is being discharged due to not returning since the last visit.  Otelia Sergeant, PT, DPT 04/15/243:58 PM

## 2022-03-15 ENCOUNTER — Ambulatory Visit (HOSPITAL_BASED_OUTPATIENT_CLINIC_OR_DEPARTMENT_OTHER): Payer: Medicare PPO

## 2022-03-15 ENCOUNTER — Other Ambulatory Visit (HOSPITAL_COMMUNITY)
Admission: RE | Admit: 2022-03-15 | Discharge: 2022-03-15 | Disposition: A | Discharge status: Home / Self Care | Payer: Medicare PPO | Source: Ambulatory Visit | Attending: Obstetrics & Gynecology | Admitting: Obstetrics & Gynecology

## 2022-03-15 DIAGNOSIS — N898 Other specified noninflammatory disorders of vagina: Secondary | ICD-10-CM

## 2022-03-15 NOTE — Progress Notes (Signed)
Patient came in today to give a self aptima swab. Patient is complaining of vaginal itching. tbw

## 2022-03-18 ENCOUNTER — Ambulatory Visit: Payer: Medicare PPO | Admitting: Physical Therapy

## 2022-03-18 LAB — CERVICOVAGINAL ANCILLARY ONLY
Bacterial Vaginitis (gardnerella): NEGATIVE
Candida Glabrata: NEGATIVE
Candida Vaginitis: NEGATIVE
Comment: NEGATIVE
Comment: NEGATIVE
Comment: NEGATIVE

## 2022-03-20 ENCOUNTER — Telehealth (HOSPITAL_BASED_OUTPATIENT_CLINIC_OR_DEPARTMENT_OTHER): Payer: Self-pay | Admitting: Obstetrics & Gynecology

## 2022-03-20 NOTE — Telephone Encounter (Signed)
Patient called and would like for the nurse to call her .

## 2022-03-21 ENCOUNTER — Other Ambulatory Visit (HOSPITAL_BASED_OUTPATIENT_CLINIC_OR_DEPARTMENT_OTHER): Payer: Self-pay | Admitting: Obstetrics & Gynecology

## 2022-03-21 NOTE — Telephone Encounter (Signed)
Patient left message stating she is still experiencing itching and burning. Per Dr. Sabra Heck, she would like for patient to try Aquaphor plus with the Hydrocortisone creme included. Patient expressed understanding and will try. She has been advised to call the office if this regimen does not work. tbw

## 2022-04-01 ENCOUNTER — Encounter: Payer: Medicare PPO | Admitting: Physical Therapy

## 2022-04-12 ENCOUNTER — Inpatient Hospital Stay: Payer: Medicare PPO | Attending: Family

## 2022-04-12 ENCOUNTER — Inpatient Hospital Stay: Payer: Medicare PPO | Admitting: Hematology and Oncology

## 2022-04-12 VITALS — BP 124/64 | HR 77 | Temp 97.7°F | Resp 16 | Ht 71.0 in | Wt 188.7 lb

## 2022-04-12 DIAGNOSIS — Z79810 Long term (current) use of selective estrogen receptor modulators (SERMs): Secondary | ICD-10-CM | POA: Diagnosis not present

## 2022-04-12 DIAGNOSIS — C50212 Malignant neoplasm of upper-inner quadrant of left female breast: Secondary | ICD-10-CM | POA: Insufficient documentation

## 2022-04-12 DIAGNOSIS — Z170 Estrogen receptor positive status [ER+]: Secondary | ICD-10-CM | POA: Insufficient documentation

## 2022-04-12 DIAGNOSIS — Z923 Personal history of irradiation: Secondary | ICD-10-CM | POA: Insufficient documentation

## 2022-04-12 LAB — CBC WITH DIFFERENTIAL/PLATELET
Abs Immature Granulocytes: 0.01 10*3/uL (ref 0.00–0.07)
Basophils Absolute: 0.1 10*3/uL (ref 0.0–0.1)
Basophils Relative: 1 %
Eosinophils Absolute: 0.2 10*3/uL (ref 0.0–0.5)
Eosinophils Relative: 3 %
HCT: 41.1 % (ref 36.0–46.0)
Hemoglobin: 13.9 g/dL (ref 12.0–15.0)
Immature Granulocytes: 0 %
Lymphocytes Relative: 29 %
Lymphs Abs: 1.8 10*3/uL (ref 0.7–4.0)
MCH: 31 pg (ref 26.0–34.0)
MCHC: 33.8 g/dL (ref 30.0–36.0)
MCV: 91.5 fL (ref 80.0–100.0)
Monocytes Absolute: 0.4 10*3/uL (ref 0.1–1.0)
Monocytes Relative: 7 %
Neutro Abs: 3.7 10*3/uL (ref 1.7–7.7)
Neutrophils Relative %: 60 %
Platelets: 257 10*3/uL (ref 150–400)
RBC: 4.49 MIL/uL (ref 3.87–5.11)
RDW: 13.1 % (ref 11.5–15.5)
WBC: 6.1 10*3/uL (ref 4.0–10.5)
nRBC: 0 % (ref 0.0–0.2)

## 2022-04-12 LAB — COMPREHENSIVE METABOLIC PANEL
ALT: 10 U/L (ref 0–44)
AST: 13 U/L — ABNORMAL LOW (ref 15–41)
Albumin: 4.3 g/dL (ref 3.5–5.0)
Alkaline Phosphatase: 51 U/L (ref 38–126)
Anion gap: 5 (ref 5–15)
BUN: 16 mg/dL (ref 8–23)
CO2: 31 mmol/L (ref 22–32)
Calcium: 9.6 mg/dL (ref 8.9–10.3)
Chloride: 103 mmol/L (ref 98–111)
Creatinine, Ser: 0.88 mg/dL (ref 0.44–1.00)
GFR, Estimated: >60 mL/min (ref >60)
Glucose, Bld: 101 mg/dL — ABNORMAL HIGH (ref 70–99)
Potassium: 4 mmol/L (ref 3.5–5.1)
Sodium: 139 mmol/L (ref 135–145)
Total Bilirubin: 0.4 mg/dL (ref 0.3–1.2)
Total Protein: 6.9 g/dL (ref 6.5–8.1)

## 2022-04-12 NOTE — Progress Notes (Unsigned)
[place]  [phone] [phone]     ID: Lauren Osborne DOB: 12-13-1955  MR#: [id number]  HQI#:[id number]  Patient Care Team: Benay Pike, MD as PCP - General (Hematology and Oncology) Buford Dresser, MD as PCP - Cardiology (Cardiology) Mauro Kaufmann, RN as Oncology Nurse Navigator Rockwell Germany, RN as Oncology Nurse Navigator Jovita Kussmaul, MD as Consulting Physician (General Surgery) Magrinat, Virgie Dad, MD (Inactive) as Consulting Physician (Oncology) [name], MD as Consulting Physician (Radiation Oncology) [name], MD as Consulting Physician (Gynecology) [name], MD as Consulting Physician (Urology) [name], MD as Consulting Physician (Ophthalmology) Melvenia Beam, MD as Consulting Physician (Neurology) Benay Pike, MD OTHER MD:  CHIEF COMPLAINT: Estrogen receptor positive breast cancer  CURRENT TREATMENT: Tamoxifen   INTERVAL HISTORY:   Lauren Osborne returns today for follow up of her estrogen receptor positive breast cancer. She started tamoxifen on 10/20/2019.  She stopped tamoxifen since about 4 weeks ago. She says she is so tired, all her bones hurt and she couldn't stand it anymore. She continues to complain of ongoing episodes of dizziness and near syncope. She had mammogram in Sep 2022, no evidence of breast malignancy. She basically feels very poorly and doesn't want to go back to full dose tamoxifen. She was also hoping to be referred to a cardiologist for further evaluation.  She does not want to go see her PCP since [facility] are at [facility].   COVID 19 VACCINATION STATUS: Status post Pfizer x4 as of August 2020   HISTORY OF CURRENT ILLNESS: From the original intake note:  Lauren Osborne had routine screening mammography on 04/14/2019 showing a possible abnormality in the bilateral breasts. She underwent bilateral diagnostic mammography with tomography and left breast  ultrasonography at [facility] on 04/20/2019 showing: breast density category B; right breast with fibroglandular tissue without persistent mass or distortion; left breast with 0.5 cm irregular mass at 11 o'clock; no abnormal left axillary lymph nodes.  Accordingly on 04/22/2019 she proceeded to biopsy of the left breast area in question. The pathology from this procedure ([id number]) showed: invasive ductal carcinoma, grade 2; ductal carcinoma in situ. Prognostic indicators significant for: estrogen receptor, 95% positive with strong staining intensity and progesterone receptor, 50% positive with moderate staining intensity. Proliferation marker Ki67 at 5%. HER2 negative by immunohistochemistry (1+).  The patient's subsequent history is as detailed below.   PAST MEDICAL HISTORY: Past Medical History:  Diagnosis Date   Abnormal Pap smear of cervix 12/2009   ASCUS cannot exclude HGSIL.   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 From [facility]     PAST SURGICAL HISTORY: Past Surgical History:  Procedure Laterality Date   BREAST LUMPECTOMY Left 07/02/2019   BREAST LUMPECTOMY WITH RADIOACTIVE SEED AND SENTINEL LYMPH NODE BIOPSY Left 07/02/2019   Procedure: LEFT BREAST LUMPECTOMY WITH RADIOACTIVE SEED AND SENTINEL LYMPH NODE BIOPSY;  Surgeon: Jovita Kussmaul, MD;  Location: [facility];  Service: General;  Laterality: Left;   BUNIONECTOMY Right    COLPOSCOPY  12/2009   negative   EYE SURGERY     cataracts   FOOT SURGERY Right    bone removed    hardward  removal from right foot  04/2014   Dr. Wardell Honour   JOINT REPLACEMENT Right 2001   Hip replacement    Posterior vaginal repair  10/21/2012   Procedure: VAGINAL REPAIR POSTERIOR; Surgeon: Daneil Dolin, MD; Location: [place]; Service: Gynecology;;   SINOSCOPY     TONSILLECTOMY     TOTAL HIP ARTHROPLASTY Left 01/2021   at [place]  10/21/2012   Procedure: UTEROSACRAL SUSPENSION; Surgeon: Daneil Dolin, MD; Location[facility] MAIN OR; Service: Gynecology;;   VAGINAL HYSTERECTOMY  10/21/2012   Procedure: HYSTERECTOMY VAGINAL; Surgeon: Daneil Dolin, MD; Location: [place]; Service: Gynecology; Laterality: N/A; bilateral salpingectomy    FAMILY HISTORY: Family History  Problem Relation Age of Onset   Rheum arthritis Mother    Cancer Father    Osteoarthritis Sister    Osteoarthritis Brother    Crohn's disease Son    Allergic rhinitis Neg Hx    Asthma Neg Hx    Eczema Neg Hx    Urticaria Neg Hx   Patient's father was 39 years old when he died from cancer, the patient does not know what type. The patient has little information on her father's family. Patient's mother died at age 16. The patient denies a family hx of breast or ovarian cancer. She has 3 maternal half-siblings, 2 half-brothers and 1 half-sister.   GYNECOLOGIC HISTORY:  Patient's last menstrual period was 05/13/2005. Menarche: 66 years old Age at first live birth: 66 years old [place] P 2 LMP age 45 Contraceptive: used for 6 months HRT no, but used a trial of local cream  Hysterectomy? Yes, 10/2012 BSO? 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 She is married. Husband [name] is retired from working in Engineer, technical sales. At home is just the two of them. Daughter [name] works in administration in [place]. Son [name] works in Engineer, technical sales in [place], [name]. [employer]ani has no grandchildren. She is not a  Designer, fashion/clothing.    ADVANCED DIRECTIVES: in place   HEALTH MAINTENANCE: Social History   Tobacco Use   Smoking status: Never   Smokeless tobacco: Never  Vaping Use   Vaping Use: Never used  Substance Use Topics   Alcohol use: Yes    Alcohol/week: 4.0 standard drinks of alcohol    Types: 4 Glasses of wine per week    Comment: glass of wine with dinner   Drug use: Never     Colonoscopy: 06/2014 (Dr. Carlton Adam), repeat in 10 years  PAP: 09/2016, negative  Bone density: yes, date unsure   Allergies  Allergen Reactions   Nitrofurantoin Other (See Comments)   Cephalexin Diarrhea   Ciprofloxacin Diarrhea   Sulfa Antibiotics Other (See Comments)    Stomach cramps and diarrhea    Current Outpatient Medications  Medication Sig Dispense Refill   aspirin-acetaminophen-caffeine (EXCEDRIN MIGRAINE) 250-250-65 MG tablet Take by mouth as needed.      azelastine (OPTIVAR) 0.05 % ophthalmic solution Place 1 drop into both eyes 2 (two) times daily. 6 mL 5   Carbinoxamine Maleate 4 MG TABS TAKE 1 TABLET BY MOUTH EVERY 8 HOURS AS NEEDED 30 tablet 0   diazepam (VALIUM) 10 MG tablet 10 mg  at bedtime. Uses vaginally for pain     Elastic Bandages & Supports (MEDICAL COMPRESSION SOCKS) MISC 15-20 MMHG COMPRESSION SOCKS TO BE WORN DAILY 1 each 5   ibuprofen (ADVIL,MOTRIN) 200 MG tablet Take 200 mg by mouth every 6 (six) hours as needed.     ipratropium (ATROVENT) 0.03 % nasal spray Place 2 sprays into both nostrils every 12 (twelve) hours.     lactulose (CHRONULAC) 10 GM/15ML solution SMARTSIG:15 Milliliter(s) By Mouth 3 Times Daily     omeprazole (PRILOSEC) 20 MG capsule TAKE 1 CAPSULE BY MOUTH EVERY DAY 30 capsule 1   tamoxifen (NOLVADEX) 10 MG tablet Take 1 tablet (10 mg total) by mouth 2 (two) times daily. 90 tablet 3   No current facility-administered medications for this visit.    OBJECTIVE:  white woman who appears stated age  4:   04/12/22 1149  BP: 124/64  Pulse: 77  Resp: 16   Temp: 97.7 F (36.5 C)  SpO2: 100%       Body mass index is 26.32 kg/m.   Wt Readings from Last 3 Encounters:  04/12/22 188 lb 11.2 oz (85.6 kg)  01/21/22 185 lb 11.2 oz (84.2 kg)  11/08/21 183 lb 3.2 oz (83.1 kg)      ECOG FS:1 - Symptomatic but completely ambulatory  Physical Exam Constitutional:      Appearance: Normal appearance.  Chest:     Comments: Bilateral breast examined.  Left breast normal to inspection or palpation no palpable masses.  No regional adenopathy.  No concern for recurrence at this time.  Right breast normal to inspection and palpation as well Musculoskeletal:     Cervical back: Normal range of motion and neck supple. No rigidity.  Lymphadenopathy:     Cervical: No cervical adenopathy.  Neurological:     Mental Status: She is alert.      LAB RESULTS:      Component Value Date/Time   NA 139 04/12/2022 1109   NA 141 12/19/2017 0855   K 4.0 04/12/2022 1109   CL 103 04/12/2022 1109   CO2 31 04/12/2022 1109   GLUCOSE 101 (H) 04/12/2022 1109   BUN 16 04/12/2022 1109   BUN 14 12/19/2017 0855   CREATININE 0.88 04/12/2022 1109   CREATININE 0.88 01/16/2021 1109   CREATININE 0.85 07/06/2015 1108   CALCIUM 9.6 04/12/2022 1109   PROT 6.9 04/12/2022 1109   PROT 6.4 12/19/2017 0855   ALBUMIN 4.3 04/12/2022 1109   AST 13 (L) 04/12/2022 1109   AST 14 (L) 01/16/2021 1109   ALT 10 04/12/2022 1109   ALT 12 01/16/2021 1109   ALKPHOS 51 04/12/2022 1109   BILITOT 0.4 04/12/2022 1109   BILITOT 0.4 01/16/2021 1109   GFRNONAA >60 04/12/2022 1109   GFRNONAA >60 01/16/2021 1109   GFRAA >60 04/28/2019 0842    No results found for: "TOTALPROTELP", "ALBUMINELP", "A1GS", "A2GS", "BETS", "BETA2SER", "GAMS", "MSPIKE", "SPEI"  No results found for: "KPAFRELGTCHN", "LAMBDASER", "KAPLAMBRATIO"  Lab Results  Component Value Date   WBC 6.1 04/12/2022   NEUTROABS 3.7 04/12/2022   HGB 13.9 04/12/2022   HCT 41.1 04/12/2022   MCV 91.5 04/12/2022   PLT 257  04/12/2022    No results found for: "LABCA2"  No components found for: "ZOXWRU045"  No results for input(s): "INR" in the last 168 hours.  No results found for: "LABCA2"  No results found for: "WUJ811"  No results found for: "CAN125"  No results found for: "BJY782"  No results found  for: "CA2729"  No components found for: "HGQUANT"  No results found for: "CEA1", "CEA" / No results found for: "CEA1", "CEA"   No results found for: "AFPTUMOR"  No results found for: "CHROMOGRNA"  No results found for: "KPAFRELGTCHN", "LAMBDASER", "KAPLAMBRATIO" (kappa/lambda light chains)  No results found for: "HGBA", "HGBA2QUANT", "HGBFQUANT", "HGBSQUAN" (Hemoglobinopathy evaluation)   No results found for: "LDH"  No results found for: "IRON", "TIBC", "IRONPCTSAT" (Iron and TIBC)  No results found for: "FERRITIN"  Urinalysis    Component Value Date/Time   BILIRUBINUR N 01/27/2018 1555   PROTEINUR Negative 01/27/2018 1555   UROBILINOGEN 0.2 01/27/2018 1555   NITRITE POSITIVE 01/27/2018 1555   LEUKOCYTESUR Negative 01/27/2018 1555    STUDIES: No results found.    ELIGIBLE FOR AVAILABLE RESEARCH PROTOCOL: [place]  ASSESSMENT: 66 y.o. Lauren Osborne, Lauren Osborne woman status post left breast upper inner quadrant biopsy 04/22/2019 for a clinical T1a N0, stage IA invasive ductal carcinoma, grade 2, estrogen and progesterone receptor positive, HER-2 not amplified, with an MIB-1 of 5%.  (1) status post left lumpectomy and sentinel lymph node sampling 07/02/2019 for a pT1c pN0, stage IA invasive ductal carcinoma, grade 2, with negative margins  (a) a total of 3 sentinel lymph nodes were removed  (2) Oncotype score of 10 predicts a risk of recurrence outside the breast in the next 9 years of 3% if the patient's only systemic therapy is antiestrogens for 5 years.  It also predicts no benefit from chemotherapy.  (3) adjuvant radiation 08/24/2019 through 09/20/2019  Site Technique Total Dose  (Gy) Dose per Fx (Gy) Completed Fx Beam Energies  Breast, Left: Breast_Lt_Bst 3D 10/10 2 5/5 6X  Breast, Left: Breast_Lt 3D 40.05/40.05 2.67 15/15 6X, 10X   (3) tamoxifen started June 2021   PLAN:   Ms. Sharvi is not tolerating tamoxifen well.  With regards to breast cancer, there is no concern for recurrence.  She is due for mammogram September 2023. She has inquired about considering low-dose of tamoxifen.  We have discussed that there is no large randomized data with low-dose of tamoxifen but some patients tolerate low-dose of tamoxifen better. She is willing to try 10 mg of tamoxifen after waiting for another couple weeks to feel better.  I have strongly encouraged her to go back and see her PCP for further evaluation of the symptoms which are likely unrelated to her antiestrogen therapy.  She expressed understanding of all the recommendations.  She will return to clinic in 6 months or sooner as needed.  Benay Pike, MD   04/12/2022 12:01 PM Medical Oncology and Hematology [facility] [place], [place] [address] Tel. [phone]    Fax. [phone]  Total time spent: 30 minutes  *Total Encounter Time as defined by the Centers for Medicare and Medicaid Services includes, in addition to the face-to-face time of a patient visit (documented in the note above) non-face-to-face time: obtaining and reviewing outside history, ordering and reviewing medications, tests or procedures, care coordination (communications with other health care professionals or caregivers) and documentation in the medical record.

## 2022-04-16 ENCOUNTER — Encounter: Payer: Self-pay | Admitting: Hematology and Oncology

## 2022-07-07 ENCOUNTER — Other Ambulatory Visit: Payer: Self-pay | Admitting: Hematology and Oncology

## 2022-10-02 ENCOUNTER — Other Ambulatory Visit: Payer: Self-pay | Admitting: Family Medicine

## 2022-10-02 DIAGNOSIS — R0789 Other chest pain: Secondary | ICD-10-CM

## 2022-10-02 DIAGNOSIS — R42 Dizziness and giddiness: Secondary | ICD-10-CM

## 2022-10-02 DIAGNOSIS — R0602 Shortness of breath: Secondary | ICD-10-CM

## 2023-01-03 ENCOUNTER — Other Ambulatory Visit: Payer: Self-pay | Admitting: Hematology and Oncology

## 2023-02-18 ENCOUNTER — Ambulatory Visit
Admission: RE | Admit: 2023-02-18 | Discharge: 2023-02-18 | Disposition: A | Discharge status: Home / Self Care | Payer: Medicare PPO | Source: Ambulatory Visit | Attending: Hematology and Oncology

## 2023-02-18 DIAGNOSIS — C50212 Malignant neoplasm of upper-inner quadrant of left female breast: Secondary | ICD-10-CM

## 2023-02-20 ENCOUNTER — Ambulatory Visit (INDEPENDENT_AMBULATORY_CARE_PROVIDER_SITE_OTHER): Payer: Self-pay | Admitting: Audiology

## 2023-02-20 DIAGNOSIS — H903 Sensorineural hearing loss, bilateral: Secondary | ICD-10-CM

## 2023-02-25 NOTE — Progress Notes (Unsigned)
[facility] [address] [place], [place] [address]  Hearing Aid Check   Charrise Lardner dropped the hearing aids to be checked.  Both hearing aids were cleaned. The wax traps were crooked and that was fixed.    Patient paid $30 at pick up.  Recommend:  Follow up with Audiology as needed or as per MD.

## 2023-04-14 ENCOUNTER — Inpatient Hospital Stay: Payer: Medicare PPO | Attending: Hematology and Oncology | Admitting: Hematology and Oncology

## 2023-04-14 VITALS — BP 122/42 | HR 87 | Temp 98.2°F | Resp 16 | Wt 188.2 lb

## 2023-04-14 DIAGNOSIS — Z923 Personal history of irradiation: Secondary | ICD-10-CM | POA: Diagnosis not present

## 2023-04-14 DIAGNOSIS — Z79899 Other long term (current) drug therapy: Secondary | ICD-10-CM | POA: Insufficient documentation

## 2023-04-14 DIAGNOSIS — Z170 Estrogen receptor positive status [ER+]: Secondary | ICD-10-CM | POA: Insufficient documentation

## 2023-04-14 DIAGNOSIS — C50212 Malignant neoplasm of upper-inner quadrant of left female breast: Secondary | ICD-10-CM | POA: Diagnosis not present

## 2023-04-14 MED ORDER — ONDANSETRON HCL 8 MG PO TABS: 8.0000 mg | ORAL_TABLET | Freq: Three times a day (TID) | ORAL | 0 refills | Status: DC | PRN

## 2023-04-14 NOTE — Progress Notes (Signed)
[facility]  Telephone:([phone] Fax:([phone]) [phone]     ID: Lauren Osborne DOB: 22-Oct-1955  MR#: [id number]  IHK#:[id number]  Patient Care Team: Rachel Moulds, MD as PCP - General (Hematology and Oncology) Jodelle Red, MD as PCP - Cardiology (Cardiology) Pershing Proud, RN as Oncology Nurse Navigator Donnelly Angelica, RN as Oncology Nurse Navigator Griselda Miner, MD as Consulting Physician (General Surgery) Magrinat, Valentino Hue, MD (Inactive) as Consulting Physician (Oncology) Antony Blackbird, MD as Consulting Physician (Radiation Oncology) Jerene Bears, MD as Consulting Physician (Gynecology) Jerrell Mylar, MD as Consulting Physician (Urology) Marvis Repress, MD as Consulting Physician (Ophthalmology) Anson Fret, MD as Consulting Physician (Neurology) Rachel Moulds, MD OTHER MD:  CHIEF COMPLAINT: Estrogen receptor positive breast cancer  CURRENT TREATMENT: Tamoxifen   INTERVAL HISTORY:   Lauren Osborne returns today for follow up of her estrogen receptor positive breast cancer.  Discussed the use of AI scribe software for clinical note transcription with the patient, who gave verbal consent to proceed.  History of Present Illness    The patient, with a history of breast cancer, presents with constant nausea and a sensation of jitteriness under the arms radiating down the sides. These symptoms have been ongoing for the past year, initially presenting as dizzy spells. Despite evaluation by a cardiologist, no cause has been identified. The patient is scheduled to see a neurologist in February for further evaluation.  The patient also reports difficulty sleeping due to these symptoms, waking up every half an hour. However, she noticed an improvement in sleep after taking half a tablet of Zofran for nausea, managing to sleep for six and a half hours straight.  In addition to these symptoms, the patient had surgery for a broken wrist last  Christmas. She also reports constipation, which she believes may be related to her medication, tamoxifen.  The patient is due to travel to [place] and is concerned about managing her symptoms during the trip. She has been taking tamoxifen for breast cancer but is considering stopping the medication to see if it improves her symptoms.  Rest of the pertinent 10 point ROS reviewed and negative  COVID 19 VACCINATION STATUS: Status post Pfizer x4 as of August 2020   HISTORY OF CURRENT ILLNESS: From the original intake note:  Nedia Brookshier had routine screening mammography on 04/14/2019 showing a possible abnormality in the bilateral breasts. She underwent bilateral diagnostic mammography with tomography and left breast ultrasonography at [facility] on 04/20/2019 showing: breast density category B; right breast with fibroglandular tissue without persistent mass or distortion; left breast with 0.5 cm irregular mass at 11 o'clock; no abnormal left axillary lymph nodes.  Accordingly on 04/22/2019 she proceeded to biopsy of the left breast area in question. The pathology from this procedure ([id number]) showed: invasive ductal carcinoma, grade 2; ductal carcinoma in situ. Prognostic indicators significant for: estrogen receptor, 95% positive with strong staining intensity and progesterone receptor, 50% positive with moderate staining intensity. Proliferation marker Ki67 at 5%. HER2 negative by immunohistochemistry (1+).  The patient's subsequent history is as detailed below.   PAST MEDICAL HISTORY: Past Medical History:  Diagnosis Date   Abnormal Pap smear of cervix 12/2009   ASCUS cannot exclude HGSIL.   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 From [facility]     PAST SURGICAL HISTORY: Past Surgical History:  Procedure Laterality Date   BREAST LUMPECTOMY Left 07/02/2019   BREAST LUMPECTOMY WITH RADIOACTIVE SEED AND SENTINEL LYMPH NODE BIOPSY Left 07/02/2019   Procedure: LEFT BREAST LUMPECTOMY WITH RADIOACTIVE SEED AND SENTINEL LYMPH NODE BIOPSY;  Surgeon: Griselda Miner, MD;  Location: [facility];  Service: General;  Laterality: Left;   BUNIONECTOMY Right    COLPOSCOPY  12/2009   negative   EYE SURGERY     cataracts   FOOT SURGERY Right    bone removed    hardward removal from right foot  04/2014   Dr. Yates Decamp   JOINT REPLACEMENT Right 2001   Hip replacement    Posterior vaginal repair  10/21/2012   Procedure: VAGINAL REPAIR POSTERIOR; Surgeon: Corena Pilgrim, MD; Location: [facility] MAIN OR; Service: Gynecology;;   SINOSCOPY     TONSILLECTOMY     TOTAL HIP ARTHROPLASTY Left 01/2021   at [facility]   UTERINE SUSPENSION  10/21/2012   Procedure: UTEROSACRAL SUSPENSION; Surgeon: Corena Pilgrim, MD; Location: [facility] MAIN OR; Service: Gynecology;;   VAGINAL HYSTERECTOMY  10/21/2012   Procedure: HYSTERECTOMY VAGINAL; Surgeon: Corena Pilgrim, MD; Location: [facility] MAIN OR; Service: Gynecology; Laterality: N/A; bilateral salpingectomy    FAMILY HISTORY: Family History  Problem Relation Age of Onset   Rheum arthritis Mother    Cancer Father    Osteoarthritis Sister    Osteoarthritis Brother    Crohn's disease Son    Allergic rhinitis Neg Hx    Asthma Neg Hx    Eczema Neg Hx    Urticaria Neg Hx   Patient's father was 62 years old when he died from cancer, the patient does not know what type. The patient has little information on her father's family. Patient's mother died at age 19. The patient denies a  family hx of breast or ovarian cancer. She has 3 maternal half-siblings, 2 half-brothers and 1 half-sister.   GYNECOLOGIC HISTORY:  Patient's last menstrual period was 05/13/2005. Menarche: 67 years old Age at first live birth: 67 years old GX P 2 LMP age 42 Contraceptive: used for 6 months HRT no, but used a trial of local cream  Hysterectomy? Yes, 10/2012 BSO? 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 She is married. Husband Idelle Crouch is retired from working in Consulting civil engineer. At home is just the two of them. Daughter Raynelle Fanning works in administration in [place]. Son Casimiro Needle works in Consulting civil engineer in [place], [place]. Devoria has no grandchildren. She is not a Actor.    ADVANCED DIRECTIVES: in place   HEALTH MAINTENANCE: Social History   Tobacco Use   Smoking status: Never   Smokeless tobacco: Never  Vaping Use   Vaping status: Never Used  Substance Use Topics   Alcohol use: Yes    Alcohol/week: 4.0 standard drinks of alcohol    Types: 4 Glasses of wine per week    Comment: glass of wine with dinner   Drug use: Never     Colonoscopy: 06/2014 (Dr. Jacinto Reap), repeat in 10 years  PAP: 09/2016, negative  Bone density: yes, date unsure   Allergies  Allergen Reactions   Nitrofurantoin Other (See Comments)   Cephalexin Diarrhea   Ciprofloxacin Diarrhea   Sulfa Antibiotics Other (See Comments)    Stomach cramps and diarrhea    Current Outpatient Medications  Medication Sig Dispense Refill   aspirin-acetaminophen-caffeine (EXCEDRIN MIGRAINE) 250-250-65 MG tablet Take by mouth as needed.      azelastine (OPTIVAR) 0.05 % ophthalmic solution Place 1 drop into both eyes 2 (two) times daily. 6 mL 5   Carbinoxamine Maleate 4 MG TABS TAKE 1 TABLET BY MOUTH EVERY 8 HOURS AS NEEDED 30 tablet 0   diazepam (VALIUM) 10 MG tablet 10 mg at bedtime. Uses vaginally for pain     Elastic Bandages & Supports (MEDICAL COMPRESSION  SOCKS) MISC 15-20 MMHG COMPRESSION SOCKS TO BE WORN DAILY 1 each 5   ibuprofen (ADVIL,MOTRIN) 200 MG tablet Take 200 mg by mouth every 6 (six) hours as needed.     ipratropium (ATROVENT) 0.03 % nasal spray Place 2 sprays into both nostrils every 12 (twelve) hours.     lactulose (CHRONULAC) 10 GM/15ML solution SMARTSIG:15 Milliliter(s) By Mouth 3 Times Daily     omeprazole (PRILOSEC) 20 MG capsule TAKE 1 CAPSULE BY MOUTH EVERY DAY 30 capsule 1   tamoxifen (NOLVADEX) 10 MG tablet TAKE 1 TABLET BY MOUTH TWICE A DAY 180 tablet 1   No current facility-administered medications for this visit.    OBJECTIVE:  white woman who appears stated age  Vitals:   04/14/23 1203  BP: (!) 122/42  Pulse: 87  Resp: 16  Temp: 98.2 F (36.8 C)  SpO2: 99%       Body mass index is 26.25 kg/m.   Wt Readings from Last 3 Encounters:  04/14/23 188 lb 3.2 oz (85.4 kg)  04/12/22 188 lb 11.2 oz (85.6 kg)  01/21/22 185 lb 11.2 oz (84.2 kg)      ECOG FS:1 - Symptomatic but completely ambulatory  PE deferred in lieu of counseling    LAB RESULTS:      Component Value Date/Time   NA 139 04/12/2022 1109   NA 141 12/19/2017 0855   K 4.0 04/12/2022 1109   CL 103 04/12/2022 1109   CO2 31 04/12/2022 1109   GLUCOSE 101 (H) 04/12/2022 1109   BUN 16 04/12/2022 1109   BUN 14 12/19/2017 0855   CREATININE 0.88 04/12/2022 1109   CREATININE 0.88 01/16/2021 1109   CREATININE 0.85 07/06/2015 1108   CALCIUM 9.6 04/12/2022 1109   PROT 6.9 04/12/2022 1109   PROT 6.4 12/19/2017 0855   ALBUMIN 4.3 04/12/2022 1109   AST 13 (L) 04/12/2022 1109   AST 14 (L) 01/16/2021 1109   ALT 10 04/12/2022 1109   ALT 12 01/16/2021 1109   ALKPHOS 51 04/12/2022 1109   BILITOT 0.4 04/12/2022 1109   BILITOT 0.4 01/16/2021 1109   GFRNONAA >60 04/12/2022 1109   GFRNONAA >60 01/16/2021 1109   GFRAA >60 04/28/2019 0842    No results found for: "TOTALPROTELP", "ALBUMINELP", "A1GS", "A2GS", "BETS", "BETA2SER", "GAMS", "MSPIKE",  "SPEI"  No results found for: "KPAFRELGTCHN", "LAMBDASER", "KAPLAMBRATIO"  Lab Results  Component Value Date   WBC 6.1 04/12/2022   NEUTROABS 3.7 04/12/2022   HGB 13.9 04/12/2022   HCT 41.1 04/12/2022   MCV 91.5 04/12/2022   PLT 257 04/12/2022    No results found for: "LABCA2"  No components found for: "WUJWJX914"  No results for input(s): "INR" in the last 168 hours.  No  results found for: "LABCA2"  No results found for: "WUJ811"  No results found for: "CAN125"  No results found for: "CAN153"  No results found for: "CA2729"  No components found for: "HGQUANT"  No results found for: "CEA1", "CEA" / No results found for: "CEA1", "CEA"   No results found for: "AFPTUMOR"  No results found for: "CHROMOGRNA"  No results found for: "KPAFRELGTCHN", "LAMBDASER", "KAPLAMBRATIO" (kappa/lambda light chains)  No results found for: "HGBA", "HGBA2QUANT", "HGBFQUANT", "HGBSQUAN" (Hemoglobinopathy evaluation)   No results found for: "LDH"  No results found for: "IRON", "TIBC", "IRONPCTSAT" (Iron and TIBC)  No results found for: "FERRITIN"  Urinalysis    Component Value Date/Time   BILIRUBINUR N 01/27/2018 1555   PROTEINUR Negative 01/27/2018 1555   UROBILINOGEN 0.2 01/27/2018 1555   NITRITE POSITIVE 01/27/2018 1555   LEUKOCYTESUR Negative 01/27/2018 1555    STUDIES: No results found.    ELIGIBLE FOR AVAILABLE RESEARCH PROTOCOL: [facility] [place]  ASSESSMENT: 67 y.o. Lauren Osborne, [place] woman status post left breast upper inner quadrant biopsy 04/22/2019 for a clinical T1a N0, stage IA invasive ductal carcinoma, grade 2, estrogen and progesterone receptor positive, HER-2 not amplified, with an MIB-1 of 5%.  (1) status post left lumpectomy and sentinel lymph node sampling 07/02/2019 for a pT1c pN0, stage IA invasive ductal carcinoma, grade 2, with negative margins  (a) a total of 3 sentinel lymph nodes were removed  (2) Oncotype score of 10 predicts a risk of recurrence  outside the breast in the next 9 years of 3% if the patient's only systemic therapy is antiestrogens for 5 years.  It also predicts no benefit from chemotherapy.  (3) adjuvant radiation 08/24/2019 through 09/20/2019  Site Technique Total Dose (Gy) Dose per Fx (Gy) Completed Fx Beam Energies  Breast, Left: Breast_Lt_Bst 3D 10/10 2 5/5 6X  Breast, Left: Breast_Lt 3D 40.05/40.05 2.67 15/15 6X, 10X   (3) tamoxifen started June 2021   PLAN:  Unexplained Nausea and Jitteriness Persistent symptoms despite negative cardiac workup. Neurology consult scheduled for February. Possible association with Tamoxifen. -Discontinue Tamoxifen until follow-up in January. -Continue Zofran as needed for nausea.  Insomnia Improved with Zofran, possibly secondary to nausea relief. -Continue Zofran as needed.  Breast Cancer Mammogram in October was normal. -Continue routine surveillance.  Constipation Unrelated to Tamoxifen, may be exacerbated by Zofran. -Monitor and manage symptomatically.  Follow-up in January post-[place] trip to reassess symptoms off Tamoxifen.  Rachel Moulds, MD   04/14/2023 12:11 PM Medical Oncology and Hematology [facility] [address] [place], [place] [address] Tel. [phone]    Fax. [phone]  Total time spent: 30 minutes  *Total Encounter Time as defined by the Centers for Medicare and Medicaid Services includes, in addition to the face-to-face time of a patient visit (documented in the note above) non-face-to-face time: obtaining and reviewing outside history, ordering and reviewing medications, tests or procedures, care coordination (communications with other health care professionals or caregivers) and documentation in the medical record.

## 2023-05-27 ENCOUNTER — Inpatient Hospital Stay: Payer: Medicare PPO | Attending: Hematology and Oncology | Admitting: Hematology and Oncology

## 2023-05-27 DIAGNOSIS — C50212 Malignant neoplasm of upper-inner quadrant of left female breast: Secondary | ICD-10-CM | POA: Diagnosis not present

## 2023-05-27 DIAGNOSIS — Z170 Estrogen receptor positive status [ER+]: Secondary | ICD-10-CM

## 2023-05-27 NOTE — Progress Notes (Signed)
 [facility]  Telephone:([phone] Fax:(336) [phone]     ID: Lauren Osborne DOB: 1955/07/30  MR#: [id number]  RDW#:[id number]  Patient Care Team: Loretha Ash, MD as PCP - General (Hematology and Oncology) Lonni Slain, MD as PCP - Cardiology (Cardiology) Glean Stephane BROCKS, RN as Oncology Nurse Navigator Tyree Nanetta SAILOR, RN as Oncology Nurse Navigator Curvin Deward MOULD, MD as Consulting Physician (General Surgery) Magrinat, Sandria BROCKS, MD (Inactive) as Consulting Physician (Oncology) Shannon Agent, MD as Consulting Physician (Radiation Oncology) Cleotilde Ronal RAMAN, MD as Consulting Physician (Gynecology) Alvia Dorothyann LABOR, MD as Consulting Physician (Urology) Cheree Banks, MD as Consulting Physician (Ophthalmology) Ines Onetha NOVAK, MD as Consulting Physician (Neurology) Ash Loretha, MD OTHER MD:  CHIEF COMPLAINT: Estrogen receptor positive breast cancer  CURRENT TREATMENT: Tamoxifen    INTERVAL HISTORY:   Lauren Osborne returns today for follow up of her estrogen receptor positive breast cancer.  Discussed the use of AI scribe software for clinical note transcription with the patient, who gave verbal consent to proceed.  History of Present Illness    The patient, with a history of breast cancer, presents with constant nausea and a sensation of jitteriness under the arms radiating down the sides. During her last visit here, we discussed about holding tamoxifen  and see if this changes anything. She continues to have these issues despite not taking tamoxifen . She is waiting to see neurology in February, hopefully they can evaluate her and see if has underlying neurologic issues to help this. She is willing to go back to tamoxifen  otherwise  Rest of the pertinent 10 point ROS reviewed and negative.   COVID 19 VACCINATION STATUS: Status post Pfizer x4 as of August 2020   HISTORY OF CURRENT ILLNESS: From the original intake note:  Lauren Osborne  had routine screening mammography on 04/14/2019 showing a possible abnormality in the bilateral breasts. She underwent bilateral diagnostic mammography with tomography and left breast ultrasonography at [facility] on 04/20/2019 showing: breast density category B; right breast with fibroglandular tissue without persistent mass or distortion; left breast with 0.5 cm irregular mass at 11 o'clock; no abnormal left axillary lymph nodes.  Accordingly on 04/22/2019 she proceeded to biopsy of the left breast area in question. The pathology from this procedure ([id number]) showed: invasive ductal carcinoma, grade 2; ductal carcinoma in situ. Prognostic indicators significant for: estrogen receptor, 95% positive with strong staining intensity and progesterone receptor, 50% positive with moderate staining intensity. Proliferation marker Ki67 at 5%. HER2 negative by immunohistochemistry (1+).  The patient's subsequent history is as detailed below.   PAST MEDICAL HISTORY: Past Medical History:  Diagnosis Date   Abnormal Pap smear of cervix 12/2009   ASCUS cannot exclude HGSIL.   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 From [facility]     PAST SURGICAL HISTORY: Past Surgical History:  Procedure Laterality Date   BREAST LUMPECTOMY Left 07/02/2019   BREAST LUMPECTOMY WITH RADIOACTIVE SEED AND SENTINEL LYMPH NODE BIOPSY Left 07/02/2019   Procedure:  LEFT BREAST LUMPECTOMY WITH RADIOACTIVE SEED AND SENTINEL LYMPH NODE BIOPSY;  Surgeon: Curvin Deward MOULD, MD;  Location: [facility];  Service: General;  Laterality: Left;   BUNIONECTOMY Right    COLPOSCOPY  12/2009   negative   EYE SURGERY     cataracts   FOOT SURGERY Right    bone removed    hardward removal from right foot  04/2014   Dr. Donna   JOINT REPLACEMENT Right 2001   Hip replacement    Posterior vaginal repair  10/21/2012   Procedure: VAGINAL REPAIR POSTERIOR; Surgeon: Alberta Jamee Batman, MD; Location[facility] MA[facility] OR; Service: Gynecology;;   SINOSCOPY     TONSILLECTOMY     TOTAL HIP ARTHROPLASTY Left 01/2021   at [facility]   UTERINE SUSPENSION  10/21/2012   Procedure: UTEROSACRAL SUSPENSION; Surgeon: Alberta Jamee Batman, MD; Location[facility] MA[facility]; Service: Gynecology;;   VAGINAL HYSTERECTOMY  10/21/2012   Procedure: HYSTERECTOMY VAGINAL; Surgeon: Alberta Jamee Batman, MD; Location: [facility] MAIN OR; Service: Gynecology; Laterality: N/A; bilateral salpingectomy    FAMILY HISTORY: Family History  Problem Relation Age of Onset   Rheum arthritis Mother    Cancer Father    Osteoarthritis Sister    Osteoarthritis Brother    Crohn's disease Son    Allergic rhinitis Neg Hx    Asthma Neg Hx    Eczema Neg Hx    Urticaria Neg Hx   Patient's father was 62 years old when he died from cancer, the patient does not know what type. The patient has little information on her father's family. Patient's mother died at age 69. The patient denies a family hx of breast or ovarian cancer. She has 3 maternal half-siblings, 2 half-brothers and 1 half-sister.   GYNECOLOGIC HISTORY:  Patient's last menstrual period was 05/13/2005. Menarche: 68 years old Age at first live birth: 68 years old GX P 2 LMP age 75 Contraceptive: used for 6 months HRT no, but used a trial of local cream  Hysterectomy? Yes, 10/2012 BSO? 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 She is married. Husband Lawanda is retired  from working in [employer]. At home is just the two of them. Daughter Mliss works in administration in [place] . Son Ozell works in [employer]ING [employer] in [place], [place]. Nhi has no grandchildren. She is not a actor.    ADVANCED DIRECTIVES: in place   HEALTH MAINTENANCE: Social History   Tobacco Use   Smoking status: Never   Smokeless tobacco: Never  Vaping Use   Vaping status: Never Used  Substance Use Topics   Alcohol use: Yes    Alcohol/week: 4.0 standard drinks of alcohol    Types: 4 Glasses of wine per week    Comment: glass of wine with dinner   Drug use: Never     Colonoscopy: 06/2014 (Dr. Winnie), repeat in 10 years  PAP: 09/2016, negative  Bone density: yes, date unsure   Allergies  Allergen Reactions   Nitrofurantoin  Other (See Comments)   Cephalexin Diarrhea   Ciprofloxacin Diarrhea   Sulfa Antibiotics Other (See Comments)    Stomach cramps and diarrhea    Current Outpatient Medications  Medication Sig Dispense Refill   aspirin-acetaminophen -caffeine (EXCEDRIN MIGRAINE) 250-250-65 MG tablet Take by mouth as needed.      azelastine  (OPTIVAR ) 0.05 % ophthalmic solution Place 1 drop into both eyes 2 (two) times daily. 6 mL 5  Carbinoxamine  Maleate 4 MG TABS TAKE 1 TABLET BY MOUTH EVERY 8 HOURS AS NEEDED 30 tablet 0   diazepam (VALIUM) 10 MG tablet 10 mg at bedtime. Uses vaginally for pain     Elastic Bandages & Supports (MEDICAL COMPRESSION SOCKS) MISC 15-20 MMHG COMPRESSION SOCKS TO BE WORN DAILY 1 each 5   ibuprofen (ADVIL,MOTRIN) 200 MG tablet Take 200 mg by mouth every 6 (six) hours as needed.     ipratropium (ATROVENT) 0.03 % nasal spray Place 2 sprays into both nostrils every 12 (twelve) hours.     lactulose (CHRONULAC) 10 GM/15ML solution SMARTSIG:15 Milliliter(s) By Mouth 3 Times Daily     omeprazole  (PRILOSEC) 20 MG capsule TAKE 1 CAPSULE BY MOUTH EVERY DAY 30 capsule 1   ondansetron  (ZOFRAN ) 8 MG tablet Take 1 tablet (8 mg total) by mouth every 8 (eight) hours as needed  for nausea. 30 tablet 0   tamoxifen  (NOLVADEX ) 10 MG tablet TAKE 1 TABLET BY MOUTH TWICE A DAY 180 tablet 1   No current facility-administered medications for this visit.    OBJECTIVE:  white woman who appears stated age  There were no vitals filed for this visit.      There is no height or weight on file to calculate BMI.   Wt Readings from Last 3 Encounters:  04/14/23 188 lb 3.2 oz (85.4 kg)  04/12/22 188 lb 11.2 oz (85.6 kg)  01/21/22 185 lb 11.2 oz (84.2 kg)      ECOG FS:1 - Symptomatic but completely ambulatory  PE deferred in lieu of telephone visit    LAB RESULTS:      Component Value Date/Time   NA 139 04/12/2022 1109   NA 141 12/19/2017 0855   K 4.0 04/12/2022 1109   CL 103 04/12/2022 1109   CO2 31 04/12/2022 1109   GLUCOSE 101 (H) 04/12/2022 1109   BUN 16 04/12/2022 1109   BUN 14 12/19/2017 0855   CREATININE 0.88 04/12/2022 1109   CREATININE 0.88 01/16/2021 1109   CREATININE 0.85 07/06/2015 1108   CALCIUM 9.6 04/12/2022 1109   PROT 6.9 04/12/2022 1109   PROT 6.4 12/19/2017 0855   ALBUMIN 4.3 04/12/2022 1109   AST 13 (L) 04/12/2022 1109   AST 14 (L) 01/16/2021 1109   ALT 10 04/12/2022 1109   ALT 12 01/16/2021 1109   ALKPHOS 51 04/12/2022 1109   BILITOT 0.4 04/12/2022 1109   BILITOT 0.4 01/16/2021 1109   GFRNONAA >60 04/12/2022 1109   GFRNONAA >60 01/16/2021 1109   GFRAA >60 04/28/2019 0842    No results found for: TOTALPROTELP, ALBUMINELP, A1GS, A2GS, BETS, BETA2SER, GAMS, MSPIKE, SPEI  No results found for: KPAFRELGTCHN, LAMBDASER, KAPLAMBRATIO  Lab Results  Component Value Date   WBC 6.1 04/12/2022   NEUTROABS 3.7 04/12/2022   HGB 13.9 04/12/2022   HCT 41.1 04/12/2022   MCV 91.5 04/12/2022   PLT 257 04/12/2022    No results found for: LABCA2  No components found for: [id number]  No results for input(s): INR in the last 168 hours.  No results found for: LABCA2  No results found for: CAN199  No  results found for: CAN125  No results found for: CAN153  No results found for: CA2729  No components found for: HGQUANT  No results found for: CEA1, CEA / No results found for: CEA1, CEA   No results found for: AFPTUMOR  No results found for: CHROMOGRNA  No results found for: KPAFRELGTCHN, LAMBDASER, KAPLAMBRATIO (kappa/lambda light chains)  No  results found for: HGBA, HGBA2QUANT, HGBFQUANT, HGBSQUAN (Hemoglobinopathy evaluation)   No results found for: LDH  No results found for: IRON, TIBC, IRONPCTSAT (Iron and TIBC)  No results found for: FERRITIN  Urinalysis    Component Value Date/Time   BILIRUBINUR N 01/27/2018 1555   PROTEINUR Negative 01/27/2018 1555   UROBILINOGEN 0.2 01/27/2018 1555   NITRITE POSITIVE 01/27/2018 1555   LEUKOCYTESUR Negative 01/27/2018 1555    STUDIES: No results found.    ELIGIBLE FOR AVAILABLE RESEARCH PROTOCOL: [facility] [place]  ASSESSMENT: 68 y.o. Lauren Osborne, Lauren Osborne woman status post left breast upper inner quadrant biopsy 04/22/2019 for a clinical T1a N0, stage IA invasive ductal carcinoma, grade 2, estrogen and progesterone receptor positive, HER-2 not amplified, with an MIB-1 of 5%.  (1) status post left lumpectomy and sentinel lymph node sampling 07/02/2019 for a pT1c pN0, stage IA invasive ductal carcinoma, grade 2, with negative margins  (a) a total of 3 sentinel lymph nodes were removed  (2) Oncotype score of 10 predicts a risk of recurrence outside the breast in the next 9 years of 3% if the patient's only systemic therapy is antiestrogens for 5 years.  It also predicts no benefit from chemotherapy.  (3) adjuvant radiation 08/24/2019 through 09/20/2019  Site Technique Total Dose (Gy) Dose per Fx (Gy) Completed Fx Beam Energies  Breast, Left: Breast_Lt_Bst 3D 10/10 2 5/5 6X  Breast, Left: Breast_Lt 3D 40.05/40.05 2.67 15/15 6X, 10X   (3) tamoxifen  started June 2021   PLAN:  This is a  follow up telephone visit to see how she is doing off of tamoxifen . She had a great trip to [place], her son got married to [place]. She felt overwhelmed a bit but everything went well. She continues to complain of this ongoing vibrations, nausea which continues to be bothersome and havent gotten any better even off of tamoxifen . We agreed that she could go back on tamoxifen  since this is unlikely from tamoxifen . Ok to continue zofran  PRN and FU with PCP and neurology Mammogram in Oct and FU with me in December as planned.  Amber Stalls, MD   05/27/2023 1:22 PM Medical Oncology and Hematology [facility] [address] [place], [place] [address] Tel. [phone]    Fax. [phone]  I connected with  Lauren Osborne on 05/27/23 by a telephone application and verified that I am speaking with the correct person using two identifiers.   I discussed the limitations of evaluation and management by telemedicine. The patient expressed understanding and agreed to proceed.  Time spent: 10 min.  *Total Encounter Time as defined by the Centers for Medicare and Medicaid Services includes, in addition to the face-to-face time of a patient visit (documented in the note above) non-face-to-face time: obtaining and reviewing outside history, ordering and reviewing medications, tests or procedures, care coordination (communications with other health care professionals or caregivers) and documentation in the medical record.

## 2023-08-26 ENCOUNTER — Other Ambulatory Visit: Payer: Self-pay | Admitting: Hematology and Oncology

## 2024-01-06 ENCOUNTER — Telehealth: Payer: Self-pay

## 2024-01-07 ENCOUNTER — Other Ambulatory Visit: Payer: Self-pay

## 2024-01-07 ENCOUNTER — Telehealth: Payer: Self-pay

## 2024-01-07 DIAGNOSIS — Z170 Estrogen receptor positive status [ER+]: Secondary | ICD-10-CM

## 2024-01-07 NOTE — Telephone Encounter (Signed)
 Pt called and states she has moved to [place] to be closer to her son and is requesting a referral to [facility] with Dr Elinor Saas. Referral and all supporting documents faxed to [phone]. Fax confirmation received.

## 2024-02-10 NOTE — Telephone Encounter (Signed)
 error

## 2024-02-19 ENCOUNTER — Encounter

## 2024-04-13 ENCOUNTER — Ambulatory Visit: Payer: Medicare PPO | Admitting: Hematology and Oncology
